# Patient Record
Sex: Male | Born: 1940
Health system: Southern US, Community
[De-identification: ages and names within clinical notes are randomized; demographics above are authoritative.]

## PROBLEM LIST (undated history)

## (undated) DIAGNOSIS — T4145XA Adverse effect of unspecified anesthetic, initial encounter: Secondary | ICD-10-CM

## (undated) DIAGNOSIS — R519 Headache, unspecified: Secondary | ICD-10-CM

## (undated) DIAGNOSIS — I10 Essential (primary) hypertension: Secondary | ICD-10-CM

## (undated) DIAGNOSIS — E785 Hyperlipidemia, unspecified: Secondary | ICD-10-CM

## (undated) DIAGNOSIS — H9190 Unspecified hearing loss, unspecified ear: Secondary | ICD-10-CM

## (undated) DIAGNOSIS — E109 Type 1 diabetes mellitus without complications: Secondary | ICD-10-CM

## (undated) DIAGNOSIS — T8859XA Other complications of anesthesia, initial encounter: Secondary | ICD-10-CM

## (undated) DIAGNOSIS — R51 Headache: Secondary | ICD-10-CM

## (undated) DIAGNOSIS — G8929 Other chronic pain: Secondary | ICD-10-CM

## (undated) DIAGNOSIS — G4733 Obstructive sleep apnea (adult) (pediatric): Secondary | ICD-10-CM

## (undated) HISTORY — DX: Headache, unspecified: R51.9

## (undated) HISTORY — DX: Type 1 diabetes mellitus without complications: E10.9

## (undated) HISTORY — DX: Hyperlipidemia, unspecified: E78.5

## (undated) HISTORY — DX: Other chronic pain: G89.29

## (undated) HISTORY — DX: Essential (primary) hypertension: I10

## (undated) HISTORY — PX: EYE SURGERY: SHX253

## (undated) HISTORY — DX: Obstructive sleep apnea (adult) (pediatric): G47.33

## (undated) HISTORY — PX: BACK SURGERY: SHX140

## (undated) HISTORY — PX: OTHER SURGICAL HISTORY: SHX169

## (undated) HISTORY — DX: Headache: R51

## (undated) HISTORY — PX: COLONOSCOPY: SHX174

---
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
## 1998-03-12 ENCOUNTER — Ambulatory Visit (HOSPITAL_COMMUNITY): Admission: RE | Admit: 1998-03-12 | Discharge: 1998-03-12 | Payer: Self-pay | Admitting: Psychiatry

## 1999-03-14 ENCOUNTER — Encounter: Admission: RE | Admit: 1999-03-14 | Discharge: 1999-06-12 | Payer: Self-pay | Admitting: Psychiatry

## 1999-12-30 ENCOUNTER — Other Ambulatory Visit: Admission: RE | Admit: 1999-12-30 | Discharge: 2000-01-05 | Payer: Self-pay | Admitting: Urology

## 2003-05-09 ENCOUNTER — Ambulatory Visit (HOSPITAL_COMMUNITY): Admission: RE | Admit: 2003-05-09 | Discharge: 2003-05-09 | Payer: Self-pay | Admitting: Gastroenterology

## 2003-11-19 ENCOUNTER — Ambulatory Visit: Payer: Self-pay | Admitting: Psychology

## 2004-05-14 ENCOUNTER — Ambulatory Visit: Payer: Self-pay | Admitting: Psychology

## 2005-04-09 ENCOUNTER — Encounter: Admission: RE | Admit: 2005-04-09 | Discharge: 2005-04-09 | Payer: Self-pay | Admitting: Internal Medicine

## 2006-10-21 ENCOUNTER — Ambulatory Visit (HOSPITAL_COMMUNITY): Payer: Self-pay | Admitting: Psychology

## 2006-10-26 ENCOUNTER — Ambulatory Visit (HOSPITAL_COMMUNITY): Payer: Self-pay | Admitting: Psychology

## 2006-11-10 ENCOUNTER — Ambulatory Visit (HOSPITAL_COMMUNITY): Payer: Self-pay | Admitting: Psychology

## 2007-03-03 HISTORY — PX: SHOULDER SURGERY: SHX246

## 2007-05-20 ENCOUNTER — Ambulatory Visit (HOSPITAL_COMMUNITY): Payer: Self-pay | Admitting: Psychology

## 2007-11-28 ENCOUNTER — Encounter: Admission: RE | Admit: 2007-11-28 | Discharge: 2007-11-28 | Payer: Self-pay | Admitting: Orthopedic Surgery

## 2009-03-02 DIAGNOSIS — G4733 Obstructive sleep apnea (adult) (pediatric): Secondary | ICD-10-CM

## 2009-03-02 HISTORY — DX: Obstructive sleep apnea (adult) (pediatric): G47.33

## 2009-10-31 ENCOUNTER — Ambulatory Visit: Payer: Self-pay | Admitting: Pulmonary Disease

## 2009-10-31 DIAGNOSIS — J310 Chronic rhinitis: Secondary | ICD-10-CM

## 2009-10-31 DIAGNOSIS — E109 Type 1 diabetes mellitus without complications: Secondary | ICD-10-CM

## 2009-10-31 DIAGNOSIS — G4700 Insomnia, unspecified: Secondary | ICD-10-CM

## 2009-10-31 DIAGNOSIS — R519 Headache, unspecified: Secondary | ICD-10-CM | POA: Insufficient documentation

## 2009-10-31 DIAGNOSIS — I10 Essential (primary) hypertension: Secondary | ICD-10-CM

## 2009-10-31 DIAGNOSIS — E785 Hyperlipidemia, unspecified: Secondary | ICD-10-CM | POA: Insufficient documentation

## 2009-10-31 DIAGNOSIS — G4733 Obstructive sleep apnea (adult) (pediatric): Secondary | ICD-10-CM

## 2009-10-31 DIAGNOSIS — R51 Headache: Secondary | ICD-10-CM

## 2009-10-31 HISTORY — DX: Essential (primary) hypertension: I10

## 2009-10-31 HISTORY — DX: Type 1 diabetes mellitus without complications: E10.9

## 2009-11-05 ENCOUNTER — Telehealth (INDEPENDENT_AMBULATORY_CARE_PROVIDER_SITE_OTHER): Payer: Self-pay | Admitting: *Deleted

## 2009-12-03 ENCOUNTER — Ambulatory Visit: Payer: Self-pay | Admitting: Pulmonary Disease

## 2009-12-11 ENCOUNTER — Encounter: Payer: Self-pay | Admitting: Pulmonary Disease

## 2010-01-10 ENCOUNTER — Ambulatory Visit (HOSPITAL_COMMUNITY): Admission: RE | Admit: 2010-01-10 | Discharge: 2010-01-11 | Payer: Self-pay | Admitting: Otolaryngology

## 2010-02-27 ENCOUNTER — Telehealth (INDEPENDENT_AMBULATORY_CARE_PROVIDER_SITE_OTHER): Payer: Self-pay | Admitting: *Deleted

## 2010-03-13 ENCOUNTER — Ambulatory Visit
Admission: RE | Admit: 2010-03-13 | Discharge: 2010-03-13 | Payer: Self-pay | Source: Home / Self Care | Attending: Pulmonary Disease | Admitting: Pulmonary Disease

## 2010-04-01 ENCOUNTER — Telehealth: Payer: Self-pay | Admitting: Pulmonary Disease

## 2010-04-01 NOTE — Progress Notes (Signed)
Summary: order of auto mode  done order sent to [facility].  Aundra Millet Farhaan Mabee LPN  November 05, 2009 3:19 PM   ---- Converted from flag ---- ---- 11/05/2009 11:16 AM, Oneita Jolly wrote: Aundra Millet will you put an order in for auto cpap x2wks with download for me thanks  ---- 10/31/2009 2:11 PM, Barbaraann Share MD wrote: they need to set on auto mode for 2 weeks with download to me. thanks  ---- 10/31/2009 12:01 PM, Oneita Jolly wrote: pt has respironics remstar auto aflex set@11cm  ------------------------------

## 2010-04-01 NOTE — Assessment & Plan Note (Signed)
Summary: self referral for management of osa   Copy to:  Self Referral Primary Provider/Referring Provider:  [name]  CC:  Sleep Consult.  History of Present Illness: the pt is a 70y/o male who comes in today as a self referral for management of osa.  He was diagnosed at the [facility] in [place] 10 yrs ago, and that study is not available.  He was started on cpap, and uses nightly, but does not know his pressure settings.  He does use a nasal mask, but wants to try a full face mask due to mouth opening.  He does not know if he is on auto mode, but states that he did have a pressure adjustment at the [place] 7mos ago.  He typically will pull his cpap off after wearing 3-4 hours.  He is unsure why he does this.  He typically goes to bed at 11pm, and arises at 6am to start his day.  He notes that if he awakens during the night, it may take him hours to get back to sleep due to mind racing.  He currently does not feel rested upon arising in the am's.  He notes some sleepiness during the day, but his epworth score is only 6.  He does take naps during the day, and likes to drink expresso during the afternoons.  Of note, his weight is up about 15 pounds since his sleep study 10 yrs ago.  Current Medications (verified): 1)  Ambien 5 Mg Tabs (Zolpidem Tartrate) .... Take 1 Tab By Mouth At Bedtime 2)  Avodart 0.5 Mg Caps (Dutasteride) .... Take 1 Tab By Mouth Every Other Day 3)  Uroxatral 10 Mg Xr24h-Tab (Alfuzosin Hcl) .... Take 1 Tab By Mouth Every Other Day 4)  Lisinopril 20 Mg Tabs (Lisinopril) .... Take 1 Tablet By Mouth Once A Day 5)  Zetia 10 Mg Tabs (Ezetimibe) .... Take 1 Tablet By Mouth Once A Day 6)  Allegra 60 Mg Tabs (Fexofenadine Hcl) .... As Needed 7)  Astepro 0.15 % Soln (Azelastine Hcl) .... Use At Bedtime 8)  Huperzine A (Herb) .... Take 1 Tablet By Mouth Once A Day  Allergies (verified): 1)  ! Aspirin 2)  ! Penicillin  Past History:  Past Medical History:  HEADACHE, CHRONIC  (ICD-784.0) OBSTRUCTIVE SLEEP APNEA (ICD-327.23) HYPERTENSION (ICD-401.9) HYPERLIPIDEMIA (ICD-272.4) DIABETES, TYPE 1 (ICD-250.01) ALLERGIC RHINITIS (ICD-477.9) s/p CHI 1975 with ICH, SDH..s/p crani    Past Surgical History: R shoulder surgery 2009 s/p crani for CHI  Family History: Reviewed history and no changes required. sister with multiple strokes brother with lung cancer. was a smoker. mother with dm and depression father was an alcoholic.  Social History: Reviewed history and no changes required. pt is married and lives with his wife. 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  pt smoked cigarettes for a "very short time as a teenager."  Pt then started smoking cigars-only occasionally- starting at age 21s.  Pt quit smoking cigars approx 2001.   Review of Systems       The patient complains of indigestion, headaches, and nasal congestion/difficulty breathing through nose.  The patient denies shortness of breath with activity, shortness of breath at rest, productive cough, non-productive cough, coughing up blood, chest pain, irregular heartbeats, acid heartburn, loss of appetite, weight change, abdominal pain, difficulty swallowing, sore throat, tooth/dental problems, sneezing, itching, ear ache, anxiety, depression, hand/feet swelling, joint stiffness or pain, rash, change in color of mucus,  and fever.    Vital Signs:  Patient profile:   70 year old male Height:      66 inches Weight:      254.13 pounds BMI:     41.17 O2 Sat:      99 % on Room air Temp:     98.5 degrees F oral Pulse rate:   78 / minute BP sitting:   142 / 80  (right arm) Cuff size:   large  Vitals Entered By: Arman Filter LPN (October 31, 2009 9:15 AM)  O2 Flow:  Room air CC: Sleep Consult Comments Medications reviewed with patient Arman Filter LPN  October 31, 2009 9:32 AM    Physical Exam  General:  obese male in nad Eyes:  PERRLA and  EOMI.   Nose:  mild deviation to left with narrowing no purulence Mouth:  moderate elongation of soft palate and uvula Neck:  no jvd, tmg, LN Lungs:  minimal basilar crackles, no wheezing or rhonchi Heart:  rrr, no mrg Abdomen:  soft and nontender, bs+ Extremities:  no edema noted, pulses intact distally no cyanosis  Neurologic:  alert and oriented, moves all 4.   Impression & Recommendations:  Problem # 1:  OBSTRUCTIVE SLEEP APNEA (ICD-327.23) the pt has osa of unknown severity, and is having issues with cpap tolerance and nonrestorative sleep.  He has gotten all of his care thru the [place], and we will try to get his records for review.  In the meantime, he would like to change his cpap supplier to a local dme.  He would like to try a full face mask, and we will also try to re-optimize his pressure for him.  I have also encouraged him to work aggressively on weight loss.  Problem # 2:  PERSISTENT DISORDER INITIATING/MAINTAINING SLEEP (ICD-307.42) the pt has sleep hygiene issues that he needs to work thru.  I have asked him to avoid taking naps, and not to use caffeine after 10am until his sleep improves.  I have also reveiwed stimulus control therapy with him to help with prolonged awakenings during the night.  Medications Added to Medication List This Visit: 1)  Ambien 5 Mg Tabs (Zolpidem tartrate) .... Take 1 tab by mouth at bedtime 2)  Avodart 0.5 Mg Caps (Dutasteride) .... Take 1 tab by mouth every other day 3)  Uroxatral 10 Mg Xr24h-tab (Alfuzosin hcl) .... Take 1 tab by mouth every other day 4)  Lisinopril 20 Mg Tabs (Lisinopril) .... Take 1 tablet by mouth once a day 5)  Zetia 10 Mg Tabs (Ezetimibe) .... Take 1 tablet by mouth once a day 6)  Allegra 60 Mg Tabs (Fexofenadine hcl) .... As needed 7)  Astepro 0.15 % Soln (Azelastine hcl) .... Use at bedtime 8)  Huperzine A (herb)  .... Take 1 tablet by mouth once a day  Other Orders: New Patient Level V (62831) DME Referral  (DME)  Patient Instructions: 1)  will get you set up with equipment company here in town..will try you on full face mask, and get pressure re-optimized. 2)  work on weight loss 3)  try the various behavioral therapies that we discussed...no reading in bed, no caffeine after 10am, no napping during day, spend no more than in bed if you cannot re-establish sleep. 4)  Once we get your cpap set, will give you 2-3 weeks with behavioral therapy to see if things improve.  Please give me feedback.

## 2010-04-01 NOTE — Assessment & Plan Note (Signed)
Summary: rov for osa   Copy to:  Self Referral Primary Janiyha Montufar/Referring Sahej Hauswirth:  [name]  CC:  follow up. pt states uses cpap 7/7 nights x 4-5 hrs a night. Pt states he is not having any issues with his machine/mask/pressure. Pt states he wants to know if he can go ahead and get his nose deviated.  Pt states he would like to get flu shot today. Christian Morgan  History of Present Illness: the pt comes in today for f/u of his osa.  He has been doing much better with new mask and his machine on auto mode.  We have yet to see a download from his dme.  The pt actually would like to stay on auto mode since he feels it is more comfortable.  He is wearing cpap compliantly, and feels that he is sleeping much better.  He continues to c/o of nasal obstruction, and would like to see ENT by having this corrected.  Current Medications (verified): 1)  Ambien 5 Mg Tabs (Zolpidem Tartrate) .... Take 1 Tab By Mouth At Bedtime 2)  Avodart 0.5 Mg Caps (Dutasteride) .... Take 1 Tab By Mouth Every Other Day 3)  Uroxatral 10 Mg Xr24h-Tab (Alfuzosin Hcl) .... Take 1 Tab By Mouth Every Other Day 4)  Lisinopril 20 Mg Tabs (Lisinopril) .... Take 1 Tablet By Mouth Once A Day 5)  Zetia 10 Mg Tabs (Ezetimibe) .... Take 1 Tablet By Mouth Once A Day 6)  Allegra 60 Mg Tabs (Fexofenadine Hcl) .... As Needed 7)  Astepro 0.15 % Soln (Azelastine Hcl) .... Use At Bedtime 8)  Huperzine A (Herb) .... Take 1 Tablet By Mouth Once A Day 9)  Metformin Hcl 500 Mg Tabs (Metformin Hcl) .... One Tablet Per Day  Allergies (verified): 1)  ! Aspirin 2)  ! Penicillin  Review of Systems       The patient complains of shortness of breath with activity, irregular heartbeats, and nasal congestion/difficulty breathing through nose.  The patient denies shortness of breath at rest, productive cough, non-productive cough, coughing up blood, chest pain, acid heartburn, indigestion, loss of appetite, weight change, abdominal pain, difficulty  swallowing, sore throat, tooth/dental problems, headaches, sneezing, itching, ear ache, anxiety, depression, hand/feet swelling, joint stiffness or pain, rash, change in color of mucus, and fever.    Vital Signs:  Patient profile:   70 year old male Height:      66 inches Weight:      256.38 pounds BMI:     41.53 O2 Sat:      96 % on Room air Temp:     97.9 degrees F oral Pulse rate:   81 / minute BP sitting:   142 / 82  (right arm) Cuff size:   large  Vitals Entered By: Carver Fila (December 03, 2009 11:10 AM)  O2 Flow:  Room air CC: follow up. pt states uses cpap 7/7 nights x 4-5 hrs a night. Pt states he is not having any issues with his machine/mask/pressure. Pt states he wants to know if he can go ahead and get his nose deviated.  Pt states he would like to get flu shot today.  Comments meds and allergies updated Phone number updated Carver Fila  December 03, 2009 11:11 AM    Physical Exam  General:  obese male in nad Nose:  no skin breakdown or pressure necrosis from cpap mask Extremities:  no significant edema or cyanosis  Neurologic:  alert, not sleepy, moves all 4.  Impression & Recommendations:  Problem # 1:  OBSTRUCTIVE SLEEP APNEA (ICD-327.23) the pt is doing well on his new machine and mask, and would like to stay on auto mode for treatment.  I am ok with  whatever will help him be more compliant.  I have also encouraged him to work aggressively on weight loss.  He is having ongoing issues with nasal obstruction, and would like a surgical opinion.  Wil. refer to ENT.  Medications Added to Medication List This Visit: 1)  Metformin Hcl 500 Mg Tabs (Metformin hcl) .... One tablet per day  Other Orders: Est. Patient Level III (62130) ENT Referral (ENT) Flu Vaccine 32yrs + MEDICARE PATIENTS (Q6578) Administration Flu vaccine - MCR (I6962)  Patient Instructions: 1)  will leave you on auto mode since it appears you are more comfortable on this. 2)  continue to work  on weight loss 3)  will give you the flu shot today 4)  will refer to ENT for nasal evaluation 5)  try turning heat up on humidifier to help with nasal congestion/moisture. 6)  followup with me in 6mos.   Immunization History:  Influenza Immunization History:    Influenza:  historical (11/30/2008)  Pneumovax Immunization History:    Pneumovax:  historical (11/30/2008)           Flu Vaccine Consent Questions     Do you have a history of severe allergic reactions to this vaccine? no    Any prior history of allergic reactions to egg and/or gelatin? no    Do you have a sensitivity to the preservative Thimersol? no    Do you have a past history of Guillan-Barre Syndrome? no    Do you currently have an acute febrile illness? no    Have you ever had a severe reaction to latex? no    Vaccine information given and explained to patient? yes    Are you currently pregnant? no    Lot Number:[id number]   Exp Date:08/30/2010   Site Given  Left Deltoid IMflu  Mindy Silva  December 03, 2009 12:13 PM

## 2010-04-01 NOTE — Consult Note (Signed)
Summary: [facility]  [facility]   Imported By: Sherian Rein 01/06/2010 09:51:58  _____________________________________________________________________  External Attachment:    Type:   Image     Comment:   External Document

## 2010-04-03 NOTE — Progress Notes (Signed)
Summary: issues sleeping - OV scheduled  Phone Note Call from Patient Call back at Home Phone [phone] Call back at [phone]   Caller: Patient Call For: Clance Reason for Call: Talk to Doctor Summary of Call: Pt c/o not being able to stay sleep after taking Ambien 5mg . Wants to know if [facility] feels he will benefit from Ambien CR. He goes to sleep right away but wakes up about 4 hours later groggy. Has researched this online and feels this will help him to sleep all night. Please advise. Thanks! [facility] [address] Initial call taken by: Zackery Barefoot CMA,  February 27, 2010 9:34 AM  Follow-up for Phone Call        I rarely prescribe sleeping pills because they do not help the situation,and often make things worse.  They are addicting and build tolerance.   If he is having insomnia issues, he needs ov if he would like to discuss Follow-up by: Barbaraann Share MD,  February 27, 2010 1:46 PM  Additional Follow-up for Phone Call Additional follow up Details #1::        Called, spoke with pt.  He was informed generally KC does not like to precribe sleeping pills bc they do not help the situation, are addicting, build tolerance, and can make things worse.  He verbalized understanding of this and does believe he needs to come in to see [facility] to discuss sleeping issues.  OV scheduled with KC for 03/13/10 at 3:45pm -- pt aware. Additional Follow-up by: Gweneth Dimitri RN,  February 27, 2010 1:57 PM

## 2010-04-03 NOTE — Assessment & Plan Note (Signed)
Summary: acute sick visit for insomnia   Copy to:  Self Referral Primary Provider/Referring Provider:  Renford Morgan  CC:  sick visit.  pt states he has no trouble falling asleep but wakes up during the night.  Pt states he is using his cpap machine every night.  Approx 4 hours per night.  Denies any complaints with mask or pressure. Christian Morgan  History of Present Illness: the pt comes in today for an acute sick visit related to sleeping issues.  He has known osa, and tells me that he is wearing cpap compliantly.  His main complaint today is that of poor sleep maintenance.  He has no issue with falling asleep, but will awaken almost every night around 3-4 am and cannot return to sleep.  He has tried taking [place] cr, but makes him feel groggy the next day.  He has worked on sleep hygiene and tried stimulus control therapy short term, but with no improvement.  He denies any issues with his mask fit or cpap pressure.     Current Medications (verified): 1)  Ambien 5 Mg Tabs (Zolpidem Tartrate) .... Take 1 Tab By Mouth At Bedtime 2)  Avodart 0.5 Mg Caps (Dutasteride) .... Take 1 Tablet By Mouth Once A Day 3)  Lisinopril 20 Mg Tabs (Lisinopril) .... Take 1 Tablet By Mouth Once A Day 4)  Zetia 10 Mg Tabs (Ezetimibe) .... Take 1 Tablet By Mouth Once A Day 5)  Allegra 60 Mg Tabs (Fexofenadine Hcl) .... As Needed 6)  Astepro 0.15 % Soln (Azelastine Hcl) .... Use At Bedtime As Needed 7)  Huperzine A (Herb) .... Take 1 Tablet By Mouth Once A Day 8)  Metformin Hcl 500 Mg Tabs (Metformin Hcl) .... 2 Tabs Daily 9)  Prilosec 10 Mg Cpdr (Omeprazole) .... As Needed  Allergies (verified): 1)  ! Aspirin 2)  ! Penicillin  Past History:  Past medical, surgical, family and social histories (including risk factors) reviewed, and no changes noted (except as noted below).  Past Medical History: Reviewed history from 10/31/2009 and no changes required.  HEADACHE, CHRONIC (ICD-784.0) OBSTRUCTIVE SLEEP APNEA  (ICD-327.23) HYPERTENSION (ICD-401.9) HYPERLIPIDEMIA (ICD-272.4) DIABETES, TYPE 1 (ICD-250.01) ALLERGIC RHINITIS (ICD-477.9) s/p CHI 1975 with ICH, SDH..s/p crani    Past Surgical History: Reviewed history from 10/31/2009 and no changes required. R shoulder surgery 2009 s/p crani for CHI  Family History: Reviewed history from 10/31/2009 and no changes required. sister with multiple strokes brother with lung cancer. was a smoker. mother with dm and depression father was an alcoholic.  Social History: Reviewed history from 10/31/2009 and no changes required. pt is married and lives with his wife. 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  pt smoked cigarettes for a "very short time as a teenager."  Pt then started smoking cigars-only occasionally- starting at age 78s.  Pt quit smoking cigars approx 2001.   Review of Systems       The patient complains of acid heartburn and indigestion.  The patient denies shortness of breath with activity, shortness of breath at rest, productive cough, non-productive cough, coughing up blood, chest pain, irregular heartbeats, loss of appetite, weight change, abdominal pain, difficulty swallowing, sore throat, tooth/dental problems, headaches, nasal congestion/difficulty breathing through nose, sneezing, itching, ear ache, anxiety, depression, hand/feet swelling, joint stiffness or pain, rash, change in color of mucus, and fever.    Vital Signs:  Patient profile:   70 year old male Height:  66 inches Weight:      252.25 pounds BMI:     40.86 O2 Sat:      96 % on Room air Temp:     98.5 degrees F oral Pulse rate:   98 / minute BP sitting:   130 / 74  (left arm) Cuff size:   large  Vitals Entered By: Arman Filter LPN (March 13, 2010 3:48 PM)  O2 Flow:  Room air CC: sick visit.  pt states he has no trouble falling asleep but wakes up during the night.  Pt states he is using his cpap machine  every night.  Approx 4 hours per night.  Denies any complaints with mask or pressure.  Comments Medications reviewed with patient Arman Filter LPN  March 13, 2010 3:49 PM    Physical Exam  General:  obese male in nad Nose:  no skin breakdown or pressure necrosis from cpap mask Heart:  rrr Extremities:  mild edema, no cyanosis Neurologic:  alert, does not appear sleepy, moves all 4.   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  I have told him that behavioral therapy is the treatment of choice, rather than sleeping pills.  He has tried some of the various behavioral techniques to see if it would help, but has not done so on a consistent basis.  I think he needs to work on stimulus control on a more consistent basis, and if not successful, may need cognitive behavioral therapy with a clinical psychologist or psychiatrist.  Will try trazodone short term for a few weeks to see if it helps "break the cycle", but would not treat him with this long term.    Problem # 2:  OBSTRUCTIVE SLEEP APNEA (ICD-327.23)  the pt states that he has no issues with his cpap mask or pressure, and is wearing the device compliantly.  I have also encouraged him to work on weight loss.  Medications Added to Medication List This Visit: 1)  Avodart 0.5 Mg Caps (Dutasteride) .... Take 1 tablet by mouth once a day 2)  Astepro 0.15 % Soln (Azelastine hcl) .... Use at bedtime as needed 3)  Metformin Hcl 500 Mg Tabs (Metformin hcl) .... 2 tabs daily 4)  Prilosec 10 Mg Cpdr (Omeprazole) .... As needed 5)  Trazodone Hcl 50 Mg Tabs (Trazodone hcl) .... At bedtime  Other Orders: Est. Patient Level IV (78295)  Patient Instructions: 1)  trial of trazodone 50mg  one at bedtime for next 2-3 weeks. 2)  continue with behavioral therapies as we have discussed  religiously. 3)  give me some feedback in 2 weeks with how things are going. 4)  if continuing to have issues, would recommend input from clinical psychologist.  Prescriptions: TRAZODONE HCL 50 MG  TABS (TRAZODONE HCL) At bedtime  #20 x 0   Entered and Authorized by:   Barbaraann Share MD   Signed by:   Barbaraann Share MD on 03/13/2010   Method used:   Print then Give to Patient   RxID:   [id number]

## 2010-04-09 NOTE — Progress Notes (Signed)
Summary: FYI  Phone Note Call from Patient Call back at [facility] Phone [phone]   Caller: Patient Call For: Madeleine Fenn Summary of Call: FYI for dr Kenneisha Cochrane. pt sent a fax that i have converted into a phone note per megan's request. the trazadone made pt dizzy so he stopped taking this. he says this did nothing for his sleep problem. last wk pt started taking melatonin and it has extended his sleep time by 1 hour most days. he is taking 2.5 mg of melatonin and will try 5 mg to see if that helps. he will make an appt with a "neurophysiologist and see if he can help. dr Kieth Brightly (or a referrel from dr R).  Initial call taken by: Tivis Ringer, CNA,  April 01, 2010 5:32 PM  Follow-up for Phone Call        noted.  I agree with this plan. Follow-up by: Barbaraann Share MD,  April 02, 2010 3:35 PM

## 2010-05-13 LAB — BASIC METABOLIC PANEL
BUN: 15 mg/dL (ref 6–23)
CO2: 29 mEq/L (ref 19–32)
Calcium: 10.8 mg/dL — ABNORMAL HIGH (ref 8.4–10.5)
Chloride: 102 mEq/L (ref 96–112)
Creatinine, Ser: 1.11 mg/dL (ref 0.4–1.5)
GFR calc Af Amer: >60 mL/min (ref >60)
GFR calc non Af Amer: >60 mL/min (ref >60)
Glucose, Bld: 132 mg/dL — ABNORMAL HIGH (ref 70–99)
Potassium: 4.4 mEq/L (ref 3.5–5.1)
Sodium: 137 mEq/L (ref 135–145)

## 2010-05-13 LAB — CBC
HCT: 42.1 % (ref 39.0–52.0)
Hemoglobin: 14.6 g/dL (ref 13.0–17.0)
MCH: 30.5 pg (ref 26.0–34.0)
MCHC: 34.7 g/dL (ref 30.0–36.0)
MCV: 87.9 fL (ref 78.0–100.0)
Platelets: 257 10*3/uL (ref 150–400)
RBC: 4.79 MIL/uL (ref 4.22–5.81)
RDW: 13.3 % (ref 11.5–15.5)
WBC: 7.6 10*3/uL (ref 4.0–10.5)

## 2010-05-13 LAB — GLUCOSE, CAPILLARY
Glucose-Capillary: 206 mg/dL — ABNORMAL HIGH (ref 70–99)
Glucose-Capillary: 212 mg/dL — ABNORMAL HIGH (ref 70–99)
Glucose-Capillary: 222 mg/dL — ABNORMAL HIGH (ref 70–99)
Glucose-Capillary: 337 mg/dL — ABNORMAL HIGH (ref 70–99)

## 2010-05-13 LAB — SURGICAL PCR SCREEN: MRSA, PCR: NEGATIVE

## 2010-06-02 ENCOUNTER — Ambulatory Visit: Payer: Self-pay | Admitting: Pulmonary Disease

## 2010-07-18 NOTE — Op Note (Signed)
NAME:  Christian Morgan, DUCHESNE SR                        ACCOUNT NO.:  [id number]   MEDICAL RECORD NO.:  [id number]                   PATIENT TYPE:  AMB   LOCATION:  ENDO                                 FACILITY:  [facility]   PHYSICIAN:  Graylin Shiver, M.D.                DATE OF BIRTH:  09/14/1940   DATE OF PROCEDURE:  05/09/2003  DATE OF DISCHARGE:                                 OPERATIVE REPORT   PROCEDURE:  Colonoscopy.   INDICATIONS FOR PROCEDURE:  Screening.   CONSENT:  Informed consent was obtained after explanation of the risks of  bleeding, infection, and perforation.   PREMEDICATION:  Fentanyl 80 mcg IV, Versed 8 mg IV.   PROCEDURE IN DETAIL:  With the patient in the left lateral decubitus  position, a rectal exam was performed and no masses were felt.  The Olympus  colonoscope was inserted into the rectum and advanced around the colon to  the cecum.  Cecal landmarks were identified.  The cecum and ascending colon  were normal.  The transverse colon was normal.  The descending colon,  sigmoid, and rectum were normal.  He tolerated the procedure well without  complications.   IMPRESSION:  Normal colonoscopy to the cecum.   PLAN:  I would recommend a screening colonoscopy again in ten years.                                               Graylin Shiver, M.D.    Germain Osgood  D:  05/09/2003  T:  05/09/2003  Job:  [id number]

## 2010-09-22 ENCOUNTER — Telehealth: Payer: Self-pay | Admitting: Pulmonary Disease

## 2010-09-22 NOTE — Telephone Encounter (Signed)
Spoke with pt and notified of recs per CDY. Pt verbalized understanding and denied any further questions.

## 2010-09-22 NOTE — Telephone Encounter (Signed)
Ask him first to contact his DME company and ask for help with the RAMP on his CPAP machine. Call us back if that doesn't fix the problem.

## 2010-09-22 NOTE — Telephone Encounter (Signed)
I spoke with the pt and he states when he first puts on his cpap he does not feel like he is getting enough air. He states he feels like he tries to take deep breaths in order to pull in more air. He is asking that his pressure be increased. Pt states after having mask on a while he feels ok. KC out of the office and pt request this be done as soon as possible so I will forward to Dr. Maple Hudson.  Please advise.Carron Curie, CMA

## 2010-09-23 ENCOUNTER — Telehealth: Payer: Self-pay | Admitting: Pulmonary Disease

## 2010-09-23 ENCOUNTER — Ambulatory Visit (INDEPENDENT_AMBULATORY_CARE_PROVIDER_SITE_OTHER): Payer: Medicare Other | Admitting: Psychology

## 2010-09-23 DIAGNOSIS — F068 Other specified mental disorders due to known physiological condition: Secondary | ICD-10-CM

## 2010-09-23 DIAGNOSIS — G4700 Insomnia, unspecified: Secondary | ICD-10-CM

## 2010-09-23 NOTE — Telephone Encounter (Signed)
lmtcb

## 2010-09-24 NOTE — Telephone Encounter (Signed)
Spoke with pt. He states that his CPAP was "reset" and he states that it is working better for him now, but he does want new machine since the one he has is 70 yrs old. Please advise if okay to send order to [facility] for a new one, thanks!

## 2010-09-30 ENCOUNTER — Other Ambulatory Visit: Payer: Self-pay | Admitting: Pulmonary Disease

## 2010-09-30 DIAGNOSIS — G4733 Obstructive sleep apnea (adult) (pediatric): Secondary | ICD-10-CM

## 2010-09-30 NOTE — Telephone Encounter (Signed)
Will send an order to dme to get new machine IF he qualifies.  Let pt know.

## 2010-10-01 NOTE — Telephone Encounter (Signed)
[email or web address]  

## 2010-10-01 NOTE — Telephone Encounter (Signed)
Spoke with pt and notified order was sent to [facility] for new CPAP if he qualifies. Pt verbalized understanding.

## 2011-02-02 ENCOUNTER — Ambulatory Visit: Payer: Medicare Other | Admitting: Pulmonary Disease

## 2011-02-03 ENCOUNTER — Telehealth: Payer: Self-pay | Admitting: Pulmonary Disease

## 2011-02-03 NOTE — Telephone Encounter (Signed)
Megan, please call pt to discuss cpap with him.  Let him know I am happy he is sleeping better. I have reviewed his download, and he is wearing compliantly (>70%), and is not having mask leaks of significance.  He is having some breakthru apneas but not a lot. Can leave on auto pressure setting or put on a set pressure at 15cm.  Let me know.

## 2011-02-04 NOTE — Telephone Encounter (Signed)
Called and spoke with pt.  Informed him of KC's response and recs.  Pt states he is doing great on his cpap currently and would like to leave pressure where it is at, which is on the auto setting.  Pt will also keep pending appt with [facility] for 02/18/11 for routine f/u appt.  Will forward message back to [facility] as an [place]

## 2011-02-17 ENCOUNTER — Encounter: Payer: Self-pay | Admitting: Pulmonary Disease

## 2011-02-18 ENCOUNTER — Encounter: Payer: Self-pay | Admitting: Pulmonary Disease

## 2011-02-18 ENCOUNTER — Ambulatory Visit (INDEPENDENT_AMBULATORY_CARE_PROVIDER_SITE_OTHER): Payer: Medicare Other | Admitting: Pulmonary Disease

## 2011-02-18 DIAGNOSIS — G4700 Insomnia, unspecified: Secondary | ICD-10-CM

## 2011-02-18 DIAGNOSIS — G4733 Obstructive sleep apnea (adult) (pediatric): Secondary | ICD-10-CM

## 2011-02-18 NOTE — Progress Notes (Signed)
  Subjective:    Patient ID: Christian Morgan, male    DOB: 1940-10-13, 70 y.o.   MRN: [id number]  HPI The patient comes in today for followup of his known obstructive sleep apnea.  He has also had issues with insomnia, but is doing much better after seeing a neuropsychologist.  He is wearing CPAP compliantly, and denies any issues with mask fit or pressure.  He has occasional leaks, but does not feel this is an issue.  He feels that his sleep is much improved, and is satisfied with his daytime alertness.   Review of Systems  Constitutional: Negative for fever and unexpected weight change.  HENT: Negative for ear pain, nosebleeds, congestion, sore throat, rhinorrhea, sneezing, trouble swallowing, dental problem, postnasal drip and sinus pressure.   Eyes: Negative for redness and itching.  Respiratory: Positive for cough. Negative for chest tightness, shortness of breath and wheezing.   Cardiovascular: Negative for palpitations and leg swelling.  Gastrointestinal: Negative for nausea and vomiting.  Genitourinary: Negative for dysuria.  Musculoskeletal: Negative for joint swelling.  Skin: Negative for rash.  Neurological: Positive for headaches.  Hematological: Does not bruise/bleed easily.  Psychiatric/Behavioral: Negative for dysphoric mood. The patient is not nervous/anxious.        Objective:   Physical Exam Overweight male in no acute distress No skin breakdown or pressure necrosis from the CPAP mask Lower extremities with minimal edema, no cyanosis noted Alert and oriented, does not appear to be sleepy, moves all 4 extremities.       Assessment & Plan:

## 2011-02-18 NOTE — Assessment & Plan Note (Signed)
The pt has been seeing a neuropsychologist and is doing much better.

## 2011-02-18 NOTE — Assessment & Plan Note (Signed)
The patient is doing very well with CPAP currently.  He denies any issues with pressure, and his mask fit is adequate.  He does have a few leaks on occasion, but does not feel this is a big issue.  He is sleeping well, and has adequate daytime alertness.  Have encouraged patient to keep up with supplies, and also to work aggressively on weight loss.

## 2011-02-18 NOTE — Patient Instructions (Signed)
Continue with cpap, and work on weight loss Try chlorpheniramine 8mg  over the counter at bedtime for postnasal drip in the place of allegra If your cough persists, please talk to your primary md about coming off lisinopril for an 8 week trial followup with me in one year.

## 2011-03-10 NOTE — Telephone Encounter (Signed)
Called and spoke with pt. He states that ov last year he talked with [facility] about his cough and KC rec for him to take chlorpheniramine otc and he has been taking been since then and cough is no better. He states that the cough is mainly dry, but sometimes is able to produce large amounts of clear sputum. He states that [facility] mentioned taking him off of ACE to see if this helps. KC, looks like you have only seen him about his sleep issues. Please advise if you want to get him in to see you about his cough or defer this issue to his PCP, thanks!

## 2011-03-10 NOTE — Telephone Encounter (Signed)
Patient returning call.  Please call cell--[phone]

## 2011-03-10 NOTE — Telephone Encounter (Signed)
I would be happy to see him for his cough, but not until he has been off ace for 6-8 weeks.  He needs to talk with his primary about getting off the med.  His cough is due to the ACE until proven otherwise.  If his cough does not resolve off medication, would be happy to see him.

## 2011-03-10 NOTE — Telephone Encounter (Signed)
Pt stated that it doesn't matter if pt takes antihistamines or not, he is still coughing.  Please call pt back at  [phone].  Pt stated he will not be home between 10 am & 12 noon.  Antionette Fairy

## 2011-03-10 NOTE — Telephone Encounter (Signed)
Spoke with pt and notified of recs per [facility]. Pt verbalized understanding and states that there is nothing further needed at this time, will call back if the cough does not improve off ACE.

## 2011-03-10 NOTE — Telephone Encounter (Signed)
[id number] x1 

## 2011-03-26 ENCOUNTER — Ambulatory Visit (INDEPENDENT_AMBULATORY_CARE_PROVIDER_SITE_OTHER): Payer: Medicare Other | Admitting: Psychology

## 2011-03-26 ENCOUNTER — Encounter (HOSPITAL_COMMUNITY): Payer: Self-pay | Admitting: Psychology

## 2011-03-26 DIAGNOSIS — F068 Other specified mental disorders due to known physiological condition: Secondary | ICD-10-CM

## 2011-03-26 DIAGNOSIS — G4701 Insomnia due to medical condition: Secondary | ICD-10-CM

## 2011-03-26 NOTE — Progress Notes (Signed)
Patient:  Christian Morgan   DOB: 08-05-1940   MR Number: [id number]   Location: [facility] [address] [place] [address] Dept: [phone]  Start:  1 PM End:  2 PM  Provider/Observer:     Hershal Coria PSYD  Chief Complaint:      Chief Complaint  Patient presents with  . Memory Loss  . Depression  . Anxiety    Reason For Service:     The patient was initially referred to our office because of cognitive difficulties that were feared to be associated with a progressive dementia or a viral encephalitis. However, the results of repeated neuropsychological assessment do not suggest a progressive decline even though there were indications of significant memory difficulties. These difficulties have been stable in nature. The patient has had difficulty adjusting to these increased cognitive problems including frustration and irritability. More recently, the patient had had a worsening of his historically poor sleep patterns. The patient had always had difficulty getting a good night sleep and these have gotten progressively worsened. The treating physician felt that behavioral interventions may be helpful.  Interventions Strategy:  Cognitive/behavioral therapeutic interventions and sleep hygiene work.  Participation Level:   Active  Participation Quality:  Appropriate      Behavioral Observation:  Well Groomed, Alert, and Appropriate.   Current Psychosocial Factors:  The patient reports that he has been actively working on a therapeutic interventions particularly around sleep hygiene issues as well as looking at some dietary changes. He reports that he has been sleeping much better and in fact has been sleeping better than he has for many years if not decades. The patient reports that the results of this improve sleep has been improved cognitive functioning as well as an improved emotional  state. Was psychosocial factor that he is continuing to way on him a lot is the lack of improvement with his wife. She is now almost completely stop driving and his increasingly fearful of doing things and he essentially does all of the household responsibilities outside of the home and drives everywhere with the exception of a few situations when they drive long distances.  Content of Session:   Review current symptoms and continued work on therapeutic interventions particularly around issues of his sleep patterns.  Current Status:   The patient reports he is improved significantly with regard to his sleep and this has produced a subsequent improvement in both his cognitive functioning and mood.  Patient Progress:   Very good  Target Goals:   Target goals include improve sleep hygiene and sleep patterns, reduced anxiety and stress responses, and improved overall medical status.  Last Reviewed:   03/26/2011  Goals Addressed Today:    Today we continue to work on issues related to sleep hygiene and sleep patterns.  Impression/Diagnosis:   The patient has maintained stable status as far as his cognitive performance and while there are significant memory deficits and difficulties they are very stable over time and this clearly indicates it is not a degenerative dementia.  Diagnosis:    Axis I:  1. 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 Insomnia due to medical condition         Axis II: No diagnosis

## 2012-02-15 ENCOUNTER — Encounter: Payer: Self-pay | Admitting: Pulmonary Disease

## 2012-02-15 ENCOUNTER — Ambulatory Visit (INDEPENDENT_AMBULATORY_CARE_PROVIDER_SITE_OTHER): Payer: Medicare Other | Admitting: Pulmonary Disease

## 2012-02-15 VITALS — BP 142/80 | HR 85 | Temp 97.8°F | Ht 65.5 in | Wt 255.6 lb

## 2012-02-15 DIAGNOSIS — G4733 Obstructive sleep apnea (adult) (pediatric): Secondary | ICD-10-CM

## 2012-02-15 NOTE — Progress Notes (Signed)
  Subjective:    Patient ID: Christian Morgan, male    DOB: 05/12/1940, 71 y.o.   MRN: [id number]  HPI Patient comes in today for followup of his known obstructive sleep apnea.  He is wearing CPAP compliantly, is having no issues with his mask fit or pressure.  He is sleeping better with CPAP and under the guidance of a neuropsychologist.  He feels rested in the mornings upon arising, and denies any significant issues with daytime sleepiness.   Review of Systems  Constitutional: Negative for fever and unexpected weight change.  HENT: Positive for nosebleeds, congestion, sore throat, rhinorrhea, sneezing, postnasal drip and sinus pressure. Negative for ear pain, trouble swallowing and dental problem.        Patient was sick x last 10 days  Eyes: Negative for redness and itching.  Respiratory: Positive for cough ( with chest congestion x 10 days...little gray mucus is coughed up today). Negative for chest tightness, shortness of breath and wheezing.   Cardiovascular: Negative for palpitations and leg swelling.  Gastrointestinal: Negative for nausea and vomiting.  Genitourinary: Negative for dysuria.  Musculoskeletal: Negative for joint swelling.  Skin: Negative for rash.  Neurological: Positive for headaches.  Hematological: Does not bruise/bleed easily.  Psychiatric/Behavioral: Negative for dysphoric mood. The patient is not nervous/anxious.        Objective:   Physical Exam Obese male in no acute distress Nose without purulence or discharge noted Neck without thyromegaly or lymphadenopathy No skin breakdown or pressure necrosis from the CPAP mask Lower extremities mild edema, no cyanosis Alert, does not appear to be sleepy, moves all 4 extremities.       Assessment & Plan:

## 2012-02-15 NOTE — Patient Instructions (Addendum)
Stay on cpap, and keep up with mask changes and supplies. Work on weight loss followup with me in one year if doing well.  

## 2012-02-15 NOTE — Assessment & Plan Note (Signed)
The patient is wearing CPAP compliantly, and is having no issues with his device.  He is sleeping much better, and feels rested the following day.  He denies any significant daytime sleepiness.  I have asked him to keep up with his mask changes and supplies, and work aggressively on weight loss.

## 2012-12-16 ENCOUNTER — Encounter: Payer: Self-pay | Admitting: Pulmonary Disease

## 2012-12-16 ENCOUNTER — Ambulatory Visit (INDEPENDENT_AMBULATORY_CARE_PROVIDER_SITE_OTHER): Payer: Medicare Other | Admitting: Pulmonary Disease

## 2012-12-16 VITALS — BP 132/80 | HR 85 | Temp 97.5°F | Ht 65.0 in | Wt 256.6 lb

## 2012-12-16 DIAGNOSIS — G4733 Obstructive sleep apnea (adult) (pediatric): Secondary | ICD-10-CM

## 2012-12-16 DIAGNOSIS — J309 Allergic rhinitis, unspecified: Secondary | ICD-10-CM

## 2012-12-16 NOTE — Patient Instructions (Signed)
Stop astepro nasal spray Trial of dymista one spray each nostril twice a day everyday.  Let me know how things are going in next 2-3 weeks. Continue with cpap Work on weight loss followup with me in one year if doing well.

## 2012-12-16 NOTE — Assessment & Plan Note (Signed)
The patient continues to do well with CPAP with regards to his sleep and daytime alertness.  However, with his ongoing rhinitis issues, he is either not wearing at times or having to sleep in a chair.  We'll try and address the rhinitis issues with medication, but ultimately he may have to make adjustments to his humidification.  I have also encouraged him to work aggressively on weight loss.

## 2012-12-16 NOTE — Progress Notes (Signed)
  Subjective:    Patient ID: Christian Morgan, male    DOB: 13-Sep-1940, 72 y.o.   MRN: [id number]  HPI The patient comes in today for followup of his obstructive sleep apnea.  He is wearing CPAP compliantly for the most part, but sometimes cannot weigh her because of nasal congestion.  Other times, he has to sleep in a recliner in order to wear CPAP with his congestion.  It is occurring primarily on his right side, and he has been examined by otolaryngology who feels that his nasal airway is patent.  He feels that he sleeps well with the device, and is not having any daytime alertness issues.  His weight is stable from the last visit.   Review of Systems  Constitutional: Negative for fever and unexpected weight change.  HENT: Positive for congestion ( at night) and sinus pressure. Negative for dental problem, ear pain, nosebleeds, postnasal drip, rhinorrhea, sneezing, sore throat and trouble swallowing.   Eyes: Negative for redness and itching.  Respiratory: Negative for cough, chest tightness, shortness of breath and wheezing.   Cardiovascular: Negative for palpitations and leg swelling.  Gastrointestinal: Negative for nausea and vomiting.  Genitourinary: Negative for dysuria.  Musculoskeletal: Negative for joint swelling.  Skin: Negative for rash.  Neurological: Negative for headaches.  Hematological: Does not bruise/bleed easily.  Psychiatric/Behavioral: Negative for dysphoric mood. The patient is not nervous/anxious.        Objective:   Physical Exam Obese male in no acute distress Nose without purulence or discharge noted Mild septal deviation to the left, but patent bilaterally, and no turbinate hypertrophy. Oropharynx clear Neck without lymphadenopathy or thyromegaly Lower extremities with no edema, no cyanosis Alert and oriented, does not appear to be sleepy, moves all 4 extremities.       Assessment & Plan:

## 2012-12-16 NOTE — Assessment & Plan Note (Signed)
The patient has chronic rhinitis, and it is unclear if this is allergic versus vasomotor rhinitis.  It is now interfering with his CPAP use, and is not helped by Astrepro alone.  Will try him on dymista.

## 2012-12-28 ENCOUNTER — Telehealth: Payer: Self-pay | Admitting: Pulmonary Disease

## 2012-12-28 NOTE — Telephone Encounter (Signed)
KC pt, per last OV note: The patient has chronic rhinitis, and it is unclear if this is allergic versus vasomotor rhinitis. It is now interfering with his CPAP use, and is not helped by Astrepro alone. Will try him on dymista. Trial of dymista one spray each nostril twice a day everyday. Let me know how things are going in next 2-3 weeks.   Pt called today to states that dymista is not helping nasal congestion at all. He states congestion is worse when he lays down, and one side is worse then the other. He states Dr. Shelle Iron mentioned him trying something OTC on the one side that is worse but he cannot remember the name. Pt requested message be sent to doc-of-day. Please advise. Carron Curie, CMA Allergies  Allergen Reactions  . Amitriptyline Hcl   . Aspirin   . Penicillins

## 2012-12-28 NOTE — Telephone Encounter (Signed)
Afrin 2 puffs 2 min prior to dymista x 5 days only then off x 5 days and back on x 5 days if relapses

## 2012-12-28 NOTE — Telephone Encounter (Signed)
I spoke with pt and is aware of recs. Nothing further needed 

## 2013-01-13 ENCOUNTER — Telehealth: Payer: Self-pay | Admitting: Pulmonary Disease

## 2013-01-13 NOTE — Telephone Encounter (Signed)
I spoke with pt. He is aware of KC recs. He reports he saw Dr. Annalee Genta about 9 months and will call for an appt. Nothing further needed

## 2013-01-13 NOTE — Telephone Encounter (Signed)
I think he needs to see ENT to see if he has a structural problem that is causing this.  See if he has seen an ent in the past, and we can make the referral for "nasal obstruction".

## 2013-01-13 NOTE — Telephone Encounter (Signed)
I spoke with pt. He reports he tried the afrin and dymista as directed by [facility]. He noticed a little difference for a few days but not much. He is still having a lot of nasal congestion at night. He has tried saline rinses--works for a few hrs but that's it. He has tried astepro and flonase. He has also tried using the saline rinse and breathe right strips. Pt reports he had a horrible night last night and he is using his CPAP and makes it worse. At times he is having to sleep in the recliner to help with this. Pt is requesting further recs. Please advise KC thanks   Allergies  Allergen Reactions  . Amitriptyline Hcl   . Aspirin   . Penicillins

## 2013-02-14 ENCOUNTER — Ambulatory Visit: Payer: Medicare Other | Admitting: Pulmonary Disease

## 2013-09-15 ENCOUNTER — Emergency Department (HOSPITAL_COMMUNITY)
Admission: EM | Admit: 2013-09-15 | Discharge: 2013-09-15 | Disposition: A | Discharge status: Home / Self Care | Payer: Medicare Other | Attending: Emergency Medicine | Admitting: Emergency Medicine

## 2013-09-15 ENCOUNTER — Encounter (HOSPITAL_COMMUNITY): Payer: Self-pay | Admitting: Emergency Medicine

## 2013-09-15 DIAGNOSIS — M5442 Lumbago with sciatica, left side: Secondary | ICD-10-CM

## 2013-09-15 DIAGNOSIS — Z79899 Other long term (current) drug therapy: Secondary | ICD-10-CM | POA: Insufficient documentation

## 2013-09-15 DIAGNOSIS — G8929 Other chronic pain: Secondary | ICD-10-CM | POA: Insufficient documentation

## 2013-09-15 DIAGNOSIS — Z87891 Personal history of nicotine dependence: Secondary | ICD-10-CM | POA: Insufficient documentation

## 2013-09-15 DIAGNOSIS — E109 Type 1 diabetes mellitus without complications: Secondary | ICD-10-CM | POA: Insufficient documentation

## 2013-09-15 DIAGNOSIS — I10 Essential (primary) hypertension: Secondary | ICD-10-CM | POA: Insufficient documentation

## 2013-09-15 DIAGNOSIS — Z9889 Other specified postprocedural states: Secondary | ICD-10-CM | POA: Insufficient documentation

## 2013-09-15 DIAGNOSIS — M5430 Sciatica, unspecified side: Secondary | ICD-10-CM | POA: Insufficient documentation

## 2013-09-15 DIAGNOSIS — Z880 Allergy status to penicillin: Secondary | ICD-10-CM | POA: Insufficient documentation

## 2013-09-15 MED ORDER — IBUPROFEN 600 MG PO TABS: 600.0000 mg | ORAL_TABLET | Freq: Three times a day (TID) | ORAL | Status: DC

## 2013-09-15 MED ORDER — DIAZEPAM 5 MG PO TABS: 5.0000 mg | ORAL_TABLET | Freq: Four times a day (QID) | ORAL | Status: DC | PRN

## 2013-09-15 MED ORDER — IBUPROFEN 400 MG PO TABS
600.0000 mg | ORAL_TABLET | Freq: Once | ORAL | Status: AC
  Administered 2013-09-15: 600 mg via ORAL
  Filled 2013-09-15 (×2): qty 1

## 2013-09-15 MED ORDER — DIAZEPAM 5 MG PO TABS
5.0000 mg | ORAL_TABLET | Freq: Once | ORAL | Status: AC
  Administered 2013-09-15: 5 mg via ORAL
  Filled 2013-09-15: qty 1

## 2013-09-15 NOTE — Discharge Instructions (Signed)
Back Pain, Adult Back pain is very common. The pain often gets better over time. The cause of back pain is usually not dangerous. Most people can learn to manage their back pain on their own.  HOME CARE   Stay active. Start with short walks on flat ground if you can. Try to walk farther each day.  Do not sit, drive, or stand in one place for more than 30 minutes. Do not stay in bed.  Do not avoid exercise or work. Activity can help your back heal faster.  Be careful when you bend or lift an object. Bend at your knees, keep the object close to you, and do not twist.  Sleep on a firm mattress. Lie on your side, and bend your knees. If you lie on your back, put a pillow under your knees.  Only take medicines as told by your doctor.  Put ice on the injured area.  Put ice in a plastic bag.  Place a towel between your skin and the bag.  Leave the ice on for 15-20 minutes, 03-04 times a day for the first 2 to 3 days. After that, you can switch between ice and heat packs.  Ask your doctor about back exercises or massage.  Avoid feeling anxious or stressed. Find good ways to deal with stress, such as exercise. GET HELP RIGHT AWAY IF:   Your pain does not go away with rest or medicine.  Your pain does not go away in 1 week.  You have new problems.  You do not feel well.  The pain spreads into your legs.  You cannot control when you poop (bowel movement) or pee (urinate).  Your arms or legs feel weak or lose feeling (numbness).  You feel sick to your stomach (nauseous) or throw up (vomit).  You have belly (abdominal) pain.  You feel like you may pass out (faint). MAKE SURE YOU:   Understand these instructions.  Will watch your condition.  Will get help right away if you are not doing well or get worse. Document Released: 08/05/2007 Document Revised: 05/11/2011 Document Reviewed: 07/07/2010 ExitCare Patient Information 2015 [employer]. This information is not intended  to replace advice given to you by your health care provider. Make sure you discuss any questions you have with your health care provider.  

## 2013-09-15 NOTE — ED Notes (Signed)
Brought pt back to room via wheelchair with family in tow; pt getting undressed and into a gown at this time; family at bedside; Adria, NT assisting

## 2013-09-15 NOTE — ED Notes (Signed)
Pt c/o left lower back pain x quite a while and sts bending over today had sharp severe pain with some diaphoresis from severe pain; pt still c/o pain

## 2013-09-16 NOTE — ED Provider Notes (Signed)
CSN: [id number]     Arrival date & time 09/15/13  1256 History   First MD Initiated Contact with Patient 09/15/13 1500     Chief Complaint  Patient presents with  . Back Pain      HPI Patient reports low back pain over the past several days.  He has a long-standing history of back pain but reports this has been worse.  He bent over earlier today and developed a sharp pain in his back with some radiation down his left buttock.  He denies new weakness in his arms or legs.  No history of cancer.  Denies fever.  No IV drug abuse.  States he feels better at this time.  They'll reports that when his pain became severe he became somewhat diaphoretic and pale.  Patient denies chest pain shortness of breath.  He denies abdominal pain.  No radiation of his back pain around to his abdomen.  No history of kidney stones.   Past Medical History  Diagnosis Date  . Chronic headache   . OSA (obstructive sleep apnea)   . Hypertension   . Hyperlipidemia   . Diabetes mellitus, type 1   . Allergic rhinitis    Past Surgical History  Procedure Laterality Date  . Shoulder surgery  2009    Right  . S/p crani for chi     Family History  Problem Relation Age of Onset  . Stroke Sister   . Lung cancer Brother   . Diabetes Mother   . Depression Mother   . Alcohol abuse Father    History  Substance Use Topics  . Smoking status: Former Smoker -- 0.10 packs/day    Types: Cigarettes, Cigars    Quit date: 03/02/2009  . Smokeless tobacco: Not on file     Comment: smoked an occ cigarette "for a very short time" as a teenager.  Then started smoking cigars- only occassional starting at age 2520s.   . Alcohol Use: Yes     Comment: 1-2 drinks in evenings    Review of Systems  All other systems reviewed and are negative.     Allergies  Amitriptyline hcl; Aspirin; and Penicillins  Home Medications   Prior to Admission medications   Medication Sig Start Date End Date Taking? Authorizing Provider   citalopram (CELEXA) 10 MG tablet Take 10 mg by mouth daily.     Yes Historical Provider, MD  dutasteride (AVODART) 0.5 MG capsule Take 0.5 mg by mouth daily.     Yes Historical Provider, MD  fexofenadine (ALLEGRA) 60 MG tablet Take 60 mg by mouth daily as needed for allergies.    Yes Historical Provider, MD  fluticasone (FLONASE) 50 MCG/ACT nasal spray Place 1 spray into both nostrils at bedtime.   Yes Historical Provider, MD  hydrochlorothiazide (HYDRODIURIL) 25 MG tablet Take 12.5 mg by mouth daily. 09/13/13  Yes Historical Provider, MD  HYDROcodone-acetaminophen (NORCO) 10-325 MG per tablet Take 1 tablet by mouth every 4 (four) hours as needed (for pain).  08/25/13  Yes Historical Provider, MD  losartan (COZAAR) 100 MG tablet Take 100 mg by mouth daily.   Yes Historical Provider, MD  metFORMIN (GLUCOPHAGE) 500 MG tablet Take 1,000 mg by mouth 2 (two) times daily.    Yes Historical Provider, MD  omeprazole (PRILOSEC) 10 MG capsule Take 10 mg by mouth daily as needed (for acid reflux).    Yes Historical Provider, MD  diazepam (VALIUM) 5 MG tablet Take 1 tablet (5 mg total) by  mouth every 6 (six) hours as needed for muscle spasms. 09/15/13   Lyanne Co, MD  ibuprofen (ADVIL,MOTRIN) 600 MG tablet Take 1 tablet (600 mg total) by mouth 3 (three) times daily. 09/15/13   Lyanne Co, MD   BP 159/64  Pulse 73  Temp(Src) 98.2 F (36.8 C) (Oral)  Resp 15  Ht 5\' 5"  (1.651 m)  Wt 245 lb (111.131 kg)  BMI 40.77 kg/m2  SpO2 100% Physical Exam  Nursing note and vitals reviewed. Constitutional: He is oriented to person, place, and time. He appears well-developed and well-nourished.  HENT:  Head: Normocephalic and atraumatic.  Eyes: EOM are normal.  Neck: Normal range of motion.  Cardiovascular: Normal rate, regular rhythm, normal heart sounds and intact distal pulses.   Pulmonary/Chest: Effort normal and breath sounds normal. No respiratory distress.  Abdominal: Soft. He exhibits no distension.  There is no tenderness.  Musculoskeletal: Normal range of motion.  The thoracic or lumbar tenderness.  5 out of 5 strength in bilateral lower extremity major muscle groups.  Neurological: He is alert and oriented to person, place, and time.  Skin: Skin is warm and dry.  Psychiatric: He has a normal mood and affect. Judgment normal.    ED Course  Procedures (including critical care time) Labs Review Labs Reviewed - No data to display  Imaging Review No results found.   EKG Interpretation None      MDM   Final diagnoses:  Left-sided low back pain with left-sided sciatica    Vision feels much better at this time.  Doubt kidney stone.  I suspect he had some lumbar spasm.  He feels better.  The majority of his symptoms sound like left-sided sciatica.  No indication for imaging.  Home with PCP followup.  Patient understands to return to the ER for new or worsening symptoms    Lyanne Co, MD 09/16/13 [phone]

## 2013-12-10 ENCOUNTER — Emergency Department (HOSPITAL_COMMUNITY)
Admission: EM | Admit: 2013-12-10 | Discharge: 2013-12-10 | Disposition: A | Discharge status: Home / Self Care | Payer: Medicare Other | Attending: Emergency Medicine | Admitting: Emergency Medicine

## 2013-12-10 ENCOUNTER — Encounter (HOSPITAL_COMMUNITY): Payer: Self-pay | Admitting: Emergency Medicine

## 2013-12-10 DIAGNOSIS — Z880 Allergy status to penicillin: Secondary | ICD-10-CM | POA: Insufficient documentation

## 2013-12-10 DIAGNOSIS — Z79899 Other long term (current) drug therapy: Secondary | ICD-10-CM | POA: Insufficient documentation

## 2013-12-10 DIAGNOSIS — H5711 Ocular pain, right eye: Secondary | ICD-10-CM | POA: Diagnosis not present

## 2013-12-10 DIAGNOSIS — Z8709 Personal history of other diseases of the respiratory system: Secondary | ICD-10-CM | POA: Insufficient documentation

## 2013-12-10 DIAGNOSIS — G8929 Other chronic pain: Secondary | ICD-10-CM | POA: Insufficient documentation

## 2013-12-10 DIAGNOSIS — R6889 Other general symptoms and signs: Secondary | ICD-10-CM

## 2013-12-10 DIAGNOSIS — I10 Essential (primary) hypertension: Secondary | ICD-10-CM | POA: Diagnosis not present

## 2013-12-10 DIAGNOSIS — Z8669 Personal history of other diseases of the nervous system and sense organs: Secondary | ICD-10-CM | POA: Diagnosis not present

## 2013-12-10 DIAGNOSIS — H9201 Otalgia, right ear: Secondary | ICD-10-CM | POA: Insufficient documentation

## 2013-12-10 DIAGNOSIS — R21 Rash and other nonspecific skin eruption: Secondary | ICD-10-CM | POA: Diagnosis present

## 2013-12-10 DIAGNOSIS — Z87891 Personal history of nicotine dependence: Secondary | ICD-10-CM | POA: Diagnosis not present

## 2013-12-10 DIAGNOSIS — Z7982 Long term (current) use of aspirin: Secondary | ICD-10-CM | POA: Diagnosis not present

## 2013-12-10 DIAGNOSIS — E119 Type 2 diabetes mellitus without complications: Secondary | ICD-10-CM | POA: Diagnosis not present

## 2013-12-10 DIAGNOSIS — B029 Zoster without complications: Secondary | ICD-10-CM | POA: Insufficient documentation

## 2013-12-10 MED ORDER — FLUORESCEIN SODIUM 1 MG OP STRP
1.0000 | ORAL_STRIP | Freq: Once | OPHTHALMIC | Status: AC
  Administered 2013-12-10: 1 via OPHTHALMIC
  Filled 2013-12-10: qty 1

## 2013-12-10 MED ORDER — VALACYCLOVIR HCL 500 MG PO TABS
1000.0000 mg | ORAL_TABLET | Freq: Once | ORAL | Status: AC
  Administered 2013-12-10: 1000 mg via ORAL
  Filled 2013-12-10: qty 2

## 2013-12-10 MED ORDER — HYDROCODONE-ACETAMINOPHEN 5-325 MG PO TABS: 1.0000 | ORAL_TABLET | ORAL | Status: DC | PRN

## 2013-12-10 MED ORDER — VALACYCLOVIR HCL 1 G PO TABS: 1000.0000 mg | ORAL_TABLET | Freq: Three times a day (TID) | ORAL | Status: AC

## 2013-12-10 NOTE — ED Notes (Signed)
Declined W/C at D/C and was escorted to lobby by RN. 

## 2013-12-10 NOTE — ED Provider Notes (Signed)
CSN: [id number]     Arrival date & time 12/10/13  0904 History  This chart was scribed for non-physician practitioner Marlon Peliffany Ephram Kornegay, PA-C working with Glynn OctaveStephen Rancour, MD by Conchita ParisNadim Abuhashem, ED Scribe. [facility] and the patient's care was started at 9:28 AM.   Chief Complaint  Patient presents with  . Rash   Patient is a 73 y.o. male presenting with rash. The history is provided by the patient. No language interpreter was used.  Rash  HPI Comments: Christian Morgan is a 73 y.o. male who presents to the Emergency Department complaining of a rash to the right side of his face that began yesterday morning. He notes that his rash has began spreading to his eye lids and mouth this morning. Pt right eyelid is irritated and the skin around its pain painful. Denies globe pain or change in vision. Pt notes the pain has also spread to his right ear.  Past Medical History  Diagnosis Date  . Chronic headache   . OSA (obstructive sleep apnea)   . Hypertension   . Hyperlipidemia   . Diabetes mellitus, type 1   . Allergic rhinitis    Past Surgical History  Procedure Laterality Date  . Shoulder surgery  2009    Right  . S/p crani for chi     Family History  Problem Relation Age of Onset  . Stroke Sister   . Lung cancer Brother   . Diabetes Mother   . Depression Mother   . Alcohol abuse Father    History  Substance Use Topics  . Smoking status: Former Smoker -- 0.10 packs/day    Types: Cigarettes, Cigars    Quit date: 03/02/2009  . Smokeless tobacco: Not on file     Comment: smoked an occ cigarette "for a very short time" as a teenager.  Then started smoking cigars- only occassional starting at age [age]s.   . Alcohol Use: Yes     Comment: 1-2 drinks in evenings    Review of Systems  HENT: Positive for ear pain.   Eyes: Positive for pain, redness and itching.  Skin: Positive for color change and rash.  All other systems reviewed and are  negative.  Allergies  Amitriptyline hcl; Aspirin; Doxycycline; Iodine; Levofloxacin; Lipitor; Other; and Penicillins  Home Medications   Prior to Admission medications   Medication Sig Start Date End Date Taking? Authorizing Provider  citalopram (CELEXA) 10 MG tablet Take 10 mg by mouth at bedtime.    Yes Historical Provider, MD  dutasteride (AVODART) 0.5 MG capsule Take 0.5 mg by mouth every evening.    Yes Historical Provider, MD  ezetimibe (ZETIA) 10 MG tablet Take 10 mg by mouth daily.   Yes Historical Provider, MD  fexofenadine (ALLEGRA) 60 MG tablet Take 60 mg by mouth daily as needed for allergies.    Yes Historical Provider, MD  fluticasone (FLONASE) 50 MCG/ACT nasal spray Place 1 spray into both nostrils at bedtime as needed.    Yes Historical Provider, MD  HYDROcodone-acetaminophen (NORCO) 10-325 MG per tablet Take 1 tablet by mouth every 4 (four) hours as needed (for pain).  08/25/13  Yes Historical Provider, MD  ibuprofen (ADVIL,MOTRIN) 600 MG tablet Take 1 tablet (600 mg total) by mouth 3 (three) times daily. 09/15/13  Yes Lyanne CoKevin M Campos, MD  losartan (COZAAR) 100 MG tablet Take 100 mg by mouth daily.   Yes Historical Provider, MD  metFORMIN (GLUCOPHAGE) 500 MG tablet Take 1,000  mg by mouth 2 (two) times daily.    Yes Historical Provider, MD  traMADol (ULTRAM) 50 MG tablet Take 50 mg by mouth 3 (three) times daily as needed for moderate pain.  12/09/13  Yes Historical Provider, MD  VOLTAREN 1 % GEL Apply 2 g topically daily. Apply to knees 10/19/13  Yes Historical Provider, MD  HYDROcodone-acetaminophen (NORCO/VICODIN) 5-325 MG per tablet Take 1 tablet by mouth every 4 (four) hours as needed for severe pain. 12/10/13   Andilyn Bettcher Irine SealG Nalanie Winiecki, PA-C  valACYclovir (VALTREX) 1000 MG tablet Take 1 tablet (1,000 mg total) by mouth 3 (three) times daily. 12/10/13 12/24/13  Almond Fitzgibbon Irine SealG Hubbert Landrigan, PA-C   BP 129/64  Pulse 92  Temp(Src) 98.2 F (36.8 C) (Oral)  Resp 14  Ht 5\' 6"  (1.676 m)  Wt 245 lb  3 oz (111.216 kg)  BMI 39.59 kg/m2  SpO2 96% Physical Exam  Nursing note and vitals reviewed. Constitutional: He is oriented to person, place, and time. He appears well-developed and well-nourished. No distress.  HENT:  Head: Normocephalic and atraumatic.  Lips appear swollen. Right ear pain Clear fluid filled vesicles to trigeminal nerve pattern to right side of face. No intra oral lesions. Lesions due extend down to his upper lip. NO crusting, scabs or drainage. Lesions are tender to the touch.  Positive Hutchinson's sign  Eyes: Conjunctivae and EOM are normal.  Fundoscopic exam:      The right eye shows no exudate and no hemorrhage.       The left eye shows no exudate and no hemorrhage.  Slit lamp exam:      The right eye shows no corneal abrasion, no corneal flare, no corneal ulcer, no fluorescein uptake and no anterior chamber bulge.       The left eye shows no corneal abrasion, no corneal flare, no corneal ulcer, no fluorescein uptake and no anterior chamber bulge.  Right eye is slightly irritated and red but no dendritic lesions on slit lamp exam   Neck: Normal range of motion.  Cardiovascular: Normal rate.   Pulmonary/Chest: Effort normal.  Neurological: He is alert and oriented to person, place, and time.  Skin: Skin is warm and dry.  Psychiatric: He has a normal mood and affect. His behavior is normal.   ED Course  Procedures  DIAGNOSTIC STUDIES: Oxygen Saturation is 96% on room air, adequate by my interpretation.    COORDINATION OF CARE: 9:33 AM- Will give the valtrex PO 1000 mg TID and refer him to an optometrist. Pt agreed to plan.    Labs Review Labs Reviewed - No data to display  Imaging Review No results found.   EKG Interpretation None      MDM   Final diagnoses:  Hutchinson's sign  Herpes zoster   10:21 am Dr. Manus Gunningancour has seen patient as well and also viewed his eye under the slit lamp. He agrees with my findings that no dendritic lesions or  any lesions are present on the patients globe.  Will consult eye doc to discuss optic vs no treatment to protect globe.  10:45 am I spoke with Dr. Vonna KotykBevis- opthalmology.  Patient will go straight to Dr. Vonna KotykBevis office for Urgent Evaluation of the eye. At risk for infection spreading to the eye due to positive Hutchinsons Sign. Pt and his son voice there understanding.  73 y.o.Adele SchilderJose M Correa Morgan's evaluation in the Emergency Department is complete. It has been determined that no acute conditions requiring further emergency intervention are present at  this time. The patient/guardian have been advised of the diagnosis and plan. We have discussed signs and symptoms that warrant return to the ED, such as changes or worsening in symptoms.  Vital signs are stable at discharge. Filed Vitals:   12/10/13 0912  BP: 129/64  Pulse: 92  Temp: 98.2 F (36.8 C)  Resp: 14    Patient/guardian has voiced understanding and agreed to follow-up with the PCP or specialist.    Dorthula Matas, PA-C 12/10/13 1109

## 2013-12-10 NOTE — ED Notes (Signed)
Pt stated, I have this rash on my face that started yesterday and I woke up this morning it had gone to my lips, they feel huge.

## 2013-12-10 NOTE — ED Provider Notes (Signed)
Medical screening examination/treatment/procedure(s) were conducted as a shared visit with non-physician practitioner(s) and myself.  I personally evaluated the patient during the encounter.  Vesicular rash to R face since yesterday involving eyelid, forehead,nose, lips. No dendritic lesions on slit lamp. D/w ophtho   EKG Interpretation None       Glynn OctaveStephen Matraca Hunkins, MD 12/10/13 1824

## 2013-12-10 NOTE — Discharge Instructions (Signed)

## 2013-12-20 ENCOUNTER — Ambulatory Visit: Payer: Medicare Other | Admitting: Pulmonary Disease

## 2014-02-14 ENCOUNTER — Ambulatory Visit (INDEPENDENT_AMBULATORY_CARE_PROVIDER_SITE_OTHER): Payer: Medicare Other | Admitting: Podiatry

## 2014-02-14 ENCOUNTER — Encounter: Payer: Self-pay | Admitting: Podiatry

## 2014-02-14 VITALS — BP 177/93 | HR 79 | Temp 98.2°F | Resp 15 | Ht 66.0 in | Wt 242.0 lb

## 2014-02-14 DIAGNOSIS — M79675 Pain in left toe(s): Secondary | ICD-10-CM

## 2014-02-14 DIAGNOSIS — B351 Tinea unguium: Secondary | ICD-10-CM

## 2014-02-14 NOTE — Patient Instructions (Signed)
Diabetes and Foot Care Diabetes may cause you to have problems because of poor blood supply (circulation) to your feet and legs. This may cause the skin on your feet to become thinner, break easier, and heal more slowly. Your skin may become dry, and the skin may peel and crack. You may also have nerve damage in your legs and feet causing decreased feeling in them. You may not notice minor injuries to your feet that could lead to infections or more serious problems. Taking care of your feet is one of the most important things you can do for yourself.  HOME CARE INSTRUCTIONS  Wear shoes at all times, even in the house. Do not go barefoot. Bare feet are easily injured.  Check your feet daily for blisters, cuts, and redness. If you cannot see the bottom of your feet, use a mirror or ask someone for help.  Wash your feet with warm water (do not use hot water) and mild soap. Then pat your feet and the areas between your toes until they are completely dry. Do not soak your feet as this can dry your skin.  Apply a moisturizing lotion or petroleum jelly (that does not contain alcohol and is unscented) to the skin on your feet and to dry, brittle toenails. Do not apply lotion between your toes.  Trim your toenails straight across. Do not dig under them or around the cuticle. File the edges of your nails with an emery board or nail file.  Do not cut corns or calluses or try to remove them with medicine.  Wear clean socks or stockings every day. Make sure they are not too tight. Do not wear knee-high stockings since they may decrease blood flow to your legs.  Wear shoes that fit properly and have enough cushioning. To break in new shoes, wear them for just a few hours a day. This prevents you from injuring your feet. Always look in your shoes before you put them on to be sure there are no objects inside.  Do not cross your legs. This may decrease the blood flow to your feet.  If you find a minor scrape,  cut, or break in the skin on your feet, keep it and the skin around it clean and dry. These areas may be cleansed with mild soap and water. Do not cleanse the area with peroxide, alcohol, or iodine.  When you remove an adhesive bandage, be sure not to damage the skin around it.  If you have a wound, look at it several times a day to make sure it is healing.  Do not use heating pads or hot water bottles. They may burn your skin. If you have lost feeling in your feet or legs, you may not know it is happening until it is too late.  Make sure your health care provider performs a complete foot exam at least annually or more often if you have foot problems. Report any cuts, sores, or bruises to your health care provider immediately. SEEK MEDICAL CARE IF:   You have an injury that is not healing.  You have cuts or breaks in the skin.  You have an ingrown nail.  You notice redness on your legs or feet.  You feel burning or tingling in your legs or feet.  You have pain or cramps in your legs and feet.  Your legs or feet are numb.  Your feet always feel cold. SEEK IMMEDIATE MEDICAL CARE IF:   There is increasing redness,   swelling, or pain in or around a wound.  There is a red line that goes up your leg.  Pus is coming from a wound.  You develop a fever or as directed by your health care provider.  You notice a bad smell coming from an ulcer or wound. Document Released: 02/14/2000 Document Revised: 10/19/2012 Document Reviewed: 07/26/2012 Exit[employer] Patient Information 2015 [employer]. This information is not intended to replace advice given to you by your health care provider. Make sure you discuss any questions you have with your health care provider.  

## 2014-02-14 NOTE — Progress Notes (Signed)
   Subjective:    Patient ID: Christian Morgan, male    DOB: [birth year], 73 y.o.   MRN: [id number]  HPI Comments: N ingrown toenail L left 1st lateral toenail border D and O 1 month C encurvated, thick painful A diabetes, difficult to cut T anti-fungal medication on the toenail due to gray color and antibiotic ointment with gauze for the pain and swelling     Review of Systems  HENT: Positive for congestion and hearing loss.   Musculoskeletal: Positive for back pain and arthralgias.       Shingles pain  Neurological: Positive for dizziness.       Objective:   Physical Exam  Orientated 3  Vascular: DP left 0/4 DP right 2/4 PT pulses 2/4 bilaterally Capillary reflex immediate bilaterally  Neurological: Sensation to 10 g monofilament wire intact 5/5 bilaterally Ankle reflex equal and reactive bilaterally Vibratory sensation intact bilaterally  Dermatological: The left hallux nail has texture and color changes with mild incurvation along the lateral margin. There is no erythema, edema or drainage noted around the left hallux nail. Remaining toenails have occasional texture and color changes  Musculoskeletal: Pes planus bilaterally Hammertoe deformity second right No restriction ankle, subtalar, midtarsal joints bilaterally         Assessment & Plan:   Assessment: Satisfactory neurovascular status Mycotic ingrowing left hallux toenail without clinical sign of bacterial infection  Plan: Debrided the left hallux nail without any bleeding. No drainage noted post debridement  Patient advised to return as needed for debridement or yearly for diabetic foot exam

## 2015-03-08 ENCOUNTER — Other Ambulatory Visit: Payer: Self-pay | Admitting: Neurosurgery

## 2015-03-08 DIAGNOSIS — M4316 Spondylolisthesis, lumbar region: Secondary | ICD-10-CM

## 2015-03-14 ENCOUNTER — Other Ambulatory Visit: Payer: Self-pay

## 2015-03-18 ENCOUNTER — Ambulatory Visit
Admission: RE | Admit: 2015-03-18 | Discharge: 2015-03-18 | Disposition: A | Discharge status: Home / Self Care | Payer: Medicare Other | Source: Ambulatory Visit | Attending: Neurosurgery | Admitting: Neurosurgery

## 2015-03-18 DIAGNOSIS — M4316 Spondylolisthesis, lumbar region: Secondary | ICD-10-CM

## 2015-03-18 MED ORDER — DIPHENHYDRAMINE HCL 25 MG PO CAPS
25.0000 mg | ORAL_CAPSULE | Freq: Once | ORAL | Status: AC
  Administered 2015-03-18: 25 mg via ORAL

## 2015-03-18 MED ORDER — IOHEXOL 180 MG/ML  SOLN
1.0000 mL | Freq: Once | INTRAMUSCULAR | Status: AC | PRN
  Administered 2015-03-18: 1 mL via INTRA_ARTICULAR

## 2015-03-18 MED ORDER — METHYLPREDNISOLONE ACETATE 40 MG/ML INJ SUSP (RADIOLOG
120.0000 mg | Freq: Once | INTRAMUSCULAR | Status: AC
  Administered 2015-03-18: 40 mg via INTRA_ARTICULAR

## 2015-03-18 NOTE — Discharge Instructions (Signed)

## 2017-01-05 DIAGNOSIS — R400 Somnolence: Secondary | ICD-10-CM

## 2017-01-05 HISTORY — DX: Somnolence: R40.0

## 2017-01-06 ENCOUNTER — Emergency Department (HOSPITAL_COMMUNITY)
Admission: EM | Admit: 2017-01-06 | Discharge: 2017-01-06 | Disposition: A | Discharge status: Home / Self Care | Payer: Medicare Other | Attending: Emergency Medicine | Admitting: Emergency Medicine

## 2017-01-06 ENCOUNTER — Emergency Department (HOSPITAL_COMMUNITY): Payer: Medicare Other

## 2017-01-06 ENCOUNTER — Encounter (HOSPITAL_COMMUNITY): Payer: Self-pay | Admitting: *Deleted

## 2017-01-06 DIAGNOSIS — Z981 Arthrodesis status: Secondary | ICD-10-CM | POA: Insufficient documentation

## 2017-01-06 DIAGNOSIS — Y9384 Activity, sleeping: Secondary | ICD-10-CM | POA: Diagnosis not present

## 2017-01-06 DIAGNOSIS — S79912A Unspecified injury of left hip, initial encounter: Secondary | ICD-10-CM | POA: Diagnosis present

## 2017-01-06 DIAGNOSIS — Z7984 Long term (current) use of oral hypoglycemic drugs: Secondary | ICD-10-CM | POA: Diagnosis not present

## 2017-01-06 DIAGNOSIS — Z87891 Personal history of nicotine dependence: Secondary | ICD-10-CM | POA: Insufficient documentation

## 2017-01-06 DIAGNOSIS — Z23 Encounter for immunization: Secondary | ICD-10-CM | POA: Insufficient documentation

## 2017-01-06 DIAGNOSIS — Y92019 Unspecified place in single-family (private) house as the place of occurrence of the external cause: Secondary | ICD-10-CM | POA: Insufficient documentation

## 2017-01-06 DIAGNOSIS — I10 Essential (primary) hypertension: Secondary | ICD-10-CM | POA: Insufficient documentation

## 2017-01-06 DIAGNOSIS — S7002XA Contusion of left hip, initial encounter: Secondary | ICD-10-CM | POA: Insufficient documentation

## 2017-01-06 DIAGNOSIS — W07XXXA Fall from chair, initial encounter: Secondary | ICD-10-CM | POA: Insufficient documentation

## 2017-01-06 DIAGNOSIS — W19XXXA Unspecified fall, initial encounter: Secondary | ICD-10-CM

## 2017-01-06 DIAGNOSIS — Y998 Other external cause status: Secondary | ICD-10-CM | POA: Insufficient documentation

## 2017-01-06 DIAGNOSIS — E109 Type 1 diabetes mellitus without complications: Secondary | ICD-10-CM | POA: Insufficient documentation

## 2017-01-06 DIAGNOSIS — R51 Headache: Secondary | ICD-10-CM | POA: Insufficient documentation

## 2017-01-06 DIAGNOSIS — M545 Low back pain: Secondary | ICD-10-CM | POA: Diagnosis not present

## 2017-01-06 MED ORDER — TETANUS-DIPHTH-ACELL PERTUSSIS 5-2.5-18.5 LF-MCG/0.5 IM SUSP
0.5000 mL | Freq: Once | INTRAMUSCULAR | Status: AC
  Administered 2017-01-06: 0.5 mL via INTRAMUSCULAR
  Filled 2017-01-06: qty 0.5

## 2017-01-06 MED ORDER — OXYCODONE HCL 5 MG PO TABS
5.0000 mg | ORAL_TABLET | Freq: Once | ORAL | Status: AC
  Administered 2017-01-06: 5 mg via ORAL
  Filled 2017-01-06: qty 1

## 2017-01-06 MED ORDER — CYCLOBENZAPRINE HCL 10 MG PO TABS: 10.0000 mg | ORAL_TABLET | Freq: Two times a day (BID) | ORAL | 0 refills | Status: DC | PRN

## 2017-01-06 NOTE — ED Notes (Signed)
Family reports that around 2-3 am pt had a fall from his computer chair. Pt reports that he took one sleeping pill and thinks he fell asleep sitting up. He reports remembering everything from the time of hitting the ground, denies LOC, denies hitting his head.

## 2017-01-06 NOTE — ED Notes (Signed)
Pt transported back to ED on stretcher with tech, tolerated well.  

## 2017-01-06 NOTE — ED Notes (Signed)
ED Provider at bedside. 

## 2017-01-06 NOTE — ED Triage Notes (Signed)
Per pt and family member, pt took a sleeping pill last night and then fell asleep while sitting in a computer chair. The chair then slid out from under him and he fell backwards and now has left side lower back pain. No acute distress is noted at this time.

## 2017-01-06 NOTE — ED Provider Notes (Signed)
[facility] EMERGENCY DEPARTMENT Provider Note   CSN: [id number] Arrival date & time: 01/06/17  2130     History   Chief Complaint Chief Complaint  Patient presents with  . Fall  . Back Pain    HPI Alphonsa Brickle Keeshawn Fakhouri is a 76 y.o. male.  HPI   Has had sleep apnea, fell asleep in the chair last night, a desk chair, thinks it may have reclined back and he fell on floor.  He took sleeping pills.  Found him at 2 in the morning.  Was able to get up and walk.  Had cut left elbow and put neosporin and bandage.  Landed on back, having left lower back and left hip pain. Had hx of back surgery 2 years ago.  Doesn't think hit head, not having any new headaches.  Hip was location of pain more severe pain. Took tylenol this AM but pain continues.  Denies head trauma, new headache, neck pain, numbness or weakness.  Reports that he has had sleep apnea and some headaches since his wife's bypass surgery.    Past Medical History:  Diagnosis Date  . Allergic rhinitis   . Chronic headache   . Diabetes mellitus, type 1   . Hyperlipidemia   . Hypertension   . OSA (obstructive sleep apnea)     Patient Active Problem List   Diagnosis Date Noted  . DIABETES, TYPE 1 10/31/2009  . HYPERLIPIDEMIA 10/31/2009  . PERSISTENT DISORDER INITIATING/MAINTAINING SLEEP 10/31/2009  . OBSTRUCTIVE SLEEP APNEA 10/31/2009  . HYPERTENSION 10/31/2009  . Chronic rhinitis 10/31/2009  . HEADACHE, CHRONIC 10/31/2009    Past Surgical History:  Procedure Laterality Date  . BACK SURGERY    . S/p Crani for CHI    . SHOULDER SURGERY  2009   Right       Home Medications    Prior to Admission medications   Medication Sig Start Date End Date Taking? Authorizing Provider  citalopram (CELEXA) 10 MG tablet Take 10 mg by mouth at bedtime.     [provider]  cyclobenzaprine (FLEXERIL) 10 MG tablet Take 1 tablet (10 mg total) 2 (two) times daily as needed by mouth for muscle spasms.  01/06/17   Alvira Monday, MD  dutasteride (AVODART) 0.5 MG capsule Take 0.5 mg by mouth every evening.     [provider]  ezetimibe (ZETIA) 10 MG tablet Take 10 mg by mouth daily.    [provider]  fexofenadine (ALLEGRA) 60 MG tablet Take 60 mg by mouth daily as needed for allergies.     [provider]  fluticasone (FLONASE) 50 MCG/ACT nasal spray Place 1 spray into both nostrils at bedtime as needed.     [provider]  HYDROcodone-acetaminophen (NORCO) 10-325 MG per tablet Take 1 tablet by mouth every 4 (four) hours as needed (for pain).  08/25/13   [provider]  HYDROcodone-acetaminophen (NORCO/VICODIN) 5-325 MG per tablet Take 1 tablet by mouth every 4 (four) hours as needed for severe pain. 12/10/13   Marlon Pel, PA-C  ibuprofen (ADVIL,MOTRIN) 600 MG tablet Take 1 tablet (600 mg total) by mouth 3 (three) times daily. 09/15/13   Azalia Bilis, MD  losartan (COZAAR) 100 MG tablet Take 100 mg by mouth daily.    [provider]  metFORMIN (GLUCOPHAGE) 500 MG tablet Take 1,000 mg by mouth 2 (two) times daily.     [provider]  Pregabalin (LYRICA PO) Take by mouth.    [provider]  VOLTAREN 1 % GEL Apply 2 g topically daily. Apply to knees 10/19/13   [provider]    Family History Family History  Problem Relation Age of Onset  . Stroke Sister   . Lung cancer Brother   . Diabetes Mother   . Depression Mother   . Alcohol abuse Father     Social History Social History   Tobacco Use  . Smoking status: Former Smoker    Packs/day: 0.10    Types: Cigarettes, Cigars    Last attempt to quit: 03/02/2009    Years since quitting: 7.8  . Smokeless tobacco: Never Used  . Tobacco comment: smoked an occ cigarette "for a very short time" as a teenager.  Then started smoking cigars- only occassional starting at age [age]s.   Substance Use Topics  . Alcohol use: Yes    Comment: 1-2 drinks in evenings    . Drug use: Not on file     Allergies   Lipitor [atorvastatin]; Penicillins; Amitriptyline hcl; Other; Aspirin; Doxycycline; Iodinated diagnostic agents; and Levofloxacin   Review of Systems Review of Systems  Constitutional: Negative for fever.  HENT: Negative for sore throat.   Eyes: Negative for visual disturbance.  Respiratory: Negative for shortness of breath.   Cardiovascular: Negative for chest pain.  Gastrointestinal: Negative for abdominal pain, nausea and vomiting.  Genitourinary: Negative for difficulty urinating.  Musculoskeletal: Positive for arthralgias and back pain. Negative for neck pain and neck stiffness.  Skin: Negative for rash.  Neurological: Positive for headaches (reports mild headaches since wife's surgery, no new headache or worsening today). Negative for syncope, facial asymmetry, weakness and numbness.     Physical Exam Updated Vital Signs BP (!) 143/67   Pulse 70   Temp 98.3 F (36.8 C) (Oral)   Resp 20   Ht 5\' 5"  (1.651 m)   Wt 108.9 kg (240 lb)   SpO2 100%   BMI 39.94 kg/m   Physical Exam  Constitutional: He is oriented to person, place, and time. He appears well-developed and well-nourished. No distress.  HENT:  Head: Normocephalic and atraumatic. Head is without raccoon's eyes, without Battle's sign and without contusion.  No hemotympanum  Eyes: Conjunctivae and EOM are normal.  Neck: Normal range of motion.  Cardiovascular: Normal rate, regular rhythm, normal heart sounds and intact distal pulses. Exam reveals no gallop and no friction rub.  No murmur heard. Pulmonary/Chest: Effort normal and breath sounds normal. No respiratory distress. He has no wheezes. He has no rales.  Abdominal: Soft. He exhibits no distension. There is no tenderness. There is no guarding.  Musculoskeletal: He exhibits no edema.       Left hip: He exhibits tenderness and bony tenderness. He exhibits normal range of motion (pain with movements).       Cervical  back: He exhibits no bony tenderness.       Thoracic back: He exhibits no bony tenderness.       Lumbar back: He exhibits no bony tenderness.  Neurological: He is alert and oriented to person, place, and time. He has normal strength. No cranial nerve deficit. GCS eye subscore is 4. GCS verbal subscore is 5. GCS motor subscore is 6.  Skin: Skin is warm and dry. He is not diaphoretic.  Nursing note and vitals reviewed.    ED Treatments / Results  Labs (all labs ordered are listed, but only abnormal results are displayed) Labs Reviewed - No data to display  EKG  EKG  Interpretation None       Radiology Dg Lumbar Spine Complete  Result Date: 01/06/2017 CLINICAL DATA:  Status post fall. Left low back pain. Left hip pain. EXAM: LUMBAR SPINE - COMPLETE 4+ VIEW COMPARISON:  None. FINDINGS: There is no evidence of lumbar spine fracture. There is 3 mm of retrolisthesis of L1 on L2 and L2 on L3. There is mild degenerative disc disease with disc height loss throughout the lumbar spine. Abdominal aortic atherosclerosis. IMPRESSION: No acute osseous injury of the lumbar spine. Electronically Signed   By: Elige KoHetal  Patel   On: 01/06/2017 11:47   Dg Hip Unilat W Or Wo Pelvis 2-3 Views Left  Result Date: 01/06/2017 CLINICAL DATA:  Left-sided low back pain and left lateral hip pain secondary to a fall at home yesterday. EXAM: DG HIP (WITH OR WITHOUT PELVIS) 2-3V LEFT COMPARISON:  Radiograph dated 08/13/2015 FINDINGS: There is no evidence of hip fracture or dislocation. There is no evidence of arthropathy or other focal bone abnormality. Bones of the pelvis appear normal. Slight calcification in the gluteal tendon insertions on the left greater trochanter. IMPRESSION: No acute abnormalities. Electronically Signed   By: Francene BoyersJames  Maxwell M.D.   On: 01/06/2017 11:50    Procedures Procedures (including critical care time)  Medications Ordered in ED Medications  oxyCODONE (Oxy IR/ROXICODONE) immediate release  tablet 5 mg (5 mg Oral Given 01/06/17 1105)  Tdap (BOOSTRIX) injection 0.5 mL (0.5 mLs Intramuscular Given 01/06/17 1058)     Initial Impression / Assessment and Plan / ED Course  I have reviewed the triage vital signs and the nursing notes.  Pertinent labs & imaging results that were available during my care of the patient were reviewed by me and considered in my medical decision making (see chart for details).     76 year old male with history above presents with concern for falling asleep in a computer chair and falling to the floor last night.  Denies new headache, head trauma, has no signs of skull fracture on exam, no nausea, no vomiting, no neurologic deficits and have low suspicion for acute intracranial injury.  He does report he has had some headaches since his wife's bypass surgery, as well as concern for sleep apnea, and recommend continued outpatient follow-up regarding these symptoms.  Denies neck pain, midline neck tenderness.  Patient's concern is left-sided hip pain.  X-ray of the lumbar spine and left hip show no sign of acute fracture.  Discussed that pain may be radicular from the back, or may also represent contusion.   Patient had been bearing weight and able to ambulate with cane, and have low suspicion for occult fracture.  Recommend Tylenol, muscle relaxant for pain, and follow-up with primary care physician. Patient discharged in stable condition with understanding of reasons to return.   Final Clinical Impressions(s) / ED Diagnoses   Final diagnoses:  Fall, initial encounter  Contusion of left hip, initial encounter    ED Discharge Orders        Ordered    cyclobenzaprine (FLEXERIL) 10 MG tablet  2 times daily PRN     01/06/17 1255       Alvira MondaySchlossman, Cordarro Spinnato, MD 01/06/17 1906

## 2017-01-15 ENCOUNTER — Encounter (HOSPITAL_BASED_OUTPATIENT_CLINIC_OR_DEPARTMENT_OTHER): Payer: Self-pay

## 2017-01-15 DIAGNOSIS — R0981 Nasal congestion: Secondary | ICD-10-CM

## 2017-01-15 DIAGNOSIS — R413 Other amnesia: Secondary | ICD-10-CM

## 2017-01-15 DIAGNOSIS — R400 Somnolence: Secondary | ICD-10-CM

## 2017-01-22 ENCOUNTER — Other Ambulatory Visit: Payer: Self-pay

## 2017-01-22 ENCOUNTER — Encounter (HOSPITAL_COMMUNITY): Payer: Self-pay | Admitting: Emergency Medicine

## 2017-01-22 DIAGNOSIS — Z87891 Personal history of nicotine dependence: Secondary | ICD-10-CM | POA: Insufficient documentation

## 2017-01-22 DIAGNOSIS — I160 Hypertensive urgency: Secondary | ICD-10-CM | POA: Insufficient documentation

## 2017-01-22 DIAGNOSIS — R51 Headache: Secondary | ICD-10-CM | POA: Diagnosis present

## 2017-01-22 DIAGNOSIS — I10 Essential (primary) hypertension: Secondary | ICD-10-CM | POA: Insufficient documentation

## 2017-01-22 DIAGNOSIS — Z79899 Other long term (current) drug therapy: Secondary | ICD-10-CM | POA: Diagnosis not present

## 2017-01-22 DIAGNOSIS — Z7984 Long term (current) use of oral hypoglycemic drugs: Secondary | ICD-10-CM | POA: Insufficient documentation

## 2017-01-22 LAB — BASIC METABOLIC PANEL
Anion gap: 10 (ref 5–15)
BUN: 17 mg/dL (ref 6–20)
CHLORIDE: 101 mmol/L (ref 101–111)
CO2: 23 mmol/L (ref 22–32)
Calcium: 10.3 mg/dL (ref 8.9–10.3)
Creatinine, Ser: 1.04 mg/dL (ref 0.61–1.24)
GFR calc Af Amer: >60 mL/min (ref >60)
GLUCOSE: 100 mg/dL — AB (ref 65–99)
POTASSIUM: 3.9 mmol/L (ref 3.5–5.1)
Sodium: 134 mmol/L — ABNORMAL LOW (ref 135–145)

## 2017-01-22 LAB — CBC
HEMATOCRIT: 38.2 % — AB (ref 39.0–52.0)
HEMOGLOBIN: 13.1 g/dL (ref 13.0–17.0)
MCH: 30.5 pg (ref 26.0–34.0)
MCHC: 34.3 g/dL (ref 30.0–36.0)
MCV: 88.8 fL (ref 78.0–100.0)
Platelets: 322 10*3/uL (ref 150–400)
RBC: 4.3 MIL/uL (ref 4.22–5.81)
RDW: 13.4 % (ref 11.5–15.5)
WBC: 10.7 10*3/uL — ABNORMAL HIGH (ref 4.0–10.5)

## 2017-01-22 LAB — URINALYSIS, ROUTINE W REFLEX MICROSCOPIC
BILIRUBIN URINE: NEGATIVE
GLUCOSE, UA: NEGATIVE mg/dL
Hgb urine dipstick: NEGATIVE
Ketones, ur: NEGATIVE mg/dL
Leukocytes, UA: NEGATIVE
NITRITE: NEGATIVE
PH: 5 (ref 5.0–8.0)
Protein, ur: NEGATIVE mg/dL
SPECIFIC GRAVITY, URINE: 1.019 (ref 1.005–1.030)

## 2017-01-22 NOTE — ED Notes (Addendum)
Patient is feeling light headed and dizzy at the moment. I rechecked the patients vital signs of the patient and reported those values to Suzy BouchardKaren N, Charity fundraiserN. Patient expressed wishes of having a snack.

## 2017-01-22 NOTE — ED Triage Notes (Signed)
Pt presents with concerns regarding BP, pt states BP 200/101, pt reports HA x 2 days with some lightheadedness.  A & O in triage. No neuro deficits

## 2017-01-23 ENCOUNTER — Emergency Department (HOSPITAL_COMMUNITY): Payer: Medicare Other

## 2017-01-23 ENCOUNTER — Encounter (HOSPITAL_COMMUNITY): Payer: Self-pay | Admitting: Internal Medicine

## 2017-01-23 ENCOUNTER — Emergency Department (HOSPITAL_COMMUNITY)
Admission: EM | Admit: 2017-01-23 | Discharge: 2017-01-23 | Disposition: A | Discharge status: Home / Self Care | Payer: Medicare Other | Attending: Emergency Medicine | Admitting: Emergency Medicine

## 2017-01-23 ENCOUNTER — Encounter (HOSPITAL_BASED_OUTPATIENT_CLINIC_OR_DEPARTMENT_OTHER): Payer: Medicare Other

## 2017-01-23 DIAGNOSIS — I160 Hypertensive urgency: Secondary | ICD-10-CM

## 2017-01-23 HISTORY — DX: Unspecified hearing loss, unspecified ear: H91.90

## 2017-01-23 LAB — CBG MONITORING, ED: GLUCOSE-CAPILLARY: 152 mg/dL — AB (ref 65–99)

## 2017-01-23 MED ORDER — CLONIDINE HCL 0.1 MG PO TABS: 0.1000 mg | ORAL_TABLET | Freq: Two times a day (BID) | ORAL | 0 refills | Status: DC

## 2017-01-23 MED ORDER — CLONIDINE HCL 0.2 MG PO TABS
0.2000 mg | ORAL_TABLET | Freq: Once | ORAL | Status: AC
  Administered 2017-01-23: 0.2 mg via ORAL
  Filled 2017-01-23: qty 1

## 2017-01-23 NOTE — ED Provider Notes (Signed)
[facility]   CSN: [id number] Arrival date & time: 01/22/17  2257     History   Chief Complaint Chief Complaint  Patient presents with  . Hypertension    HPI [facility] LLCJose M Jefm PettyCorrea Morgan is a 76 y.o. male.  Patient is a 76 year old male with past medical history of diabetes, hypertension presenting for evaluation of headache and elevated blood pressure.  This is been ongoing for the past several days.  He does report waking up with headaches for the past week and a half.  He describes this as a pressure in his head that seems to improve as the day goes on.  He has been checking his blood pressure the last 2 days and has been getting readings in the 180s systolic.  This evening it was over 200 and presents for evaluation of it.  He does report not getting much sleep and increased stress as his wife was recently hospitalized and underwent bypass surgery.  He denies any change to his antihypertensive regimen.  He reports being compliant with his medications.   The history is provided by the patient.  Hypertension  This is a new problem. Episode onset: Several days ago. The problem occurs constantly. The problem has been gradually worsening. Associated symptoms include headaches. Pertinent negatives include no chest pain, no abdominal pain and no shortness of breath. Nothing aggravates the symptoms. Nothing relieves the symptoms. He has tried nothing for the symptoms.    Past Medical History:  Diagnosis Date  . Allergic rhinitis   . Chronic headache   . Diabetes mellitus, type 1   . Hard of hearing   . Hyperlipidemia   . Hypertension   . OSA (obstructive sleep apnea)     Patient Active Problem List   Diagnosis Date Noted  . DIABETES, TYPE 1 10/31/2009  . HYPERLIPIDEMIA 10/31/2009  . PERSISTENT DISORDER INITIATING/MAINTAINING SLEEP 10/31/2009  . OBSTRUCTIVE SLEEP APNEA 10/31/2009  . HYPERTENSION 10/31/2009  . Chronic rhinitis  10/31/2009  . HEADACHE, CHRONIC 10/31/2009    Past Surgical History:  Procedure Laterality Date  . BACK SURGERY    . S/p Crani for CHI    . SHOULDER SURGERY  2009   Right       Home Medications    Prior to Admission medications   Medication Sig Start Date End Date Taking? Authorizing Provider  citalopram (CELEXA) 10 MG tablet Take 10 mg by mouth at bedtime.     [provider]  cyclobenzaprine (FLEXERIL) 10 MG tablet Take 1 tablet (10 mg total) 2 (two) times daily as needed by mouth for muscle spasms. 01/06/17   Alvira MondaySchlossman, Erin, MD  dutasteride (AVODART) 0.5 MG capsule Take 0.5 mg by mouth every evening.     [provider]  ezetimibe (ZETIA) 10 MG tablet Take 10 mg by mouth daily.    [provider]  fexofenadine (ALLEGRA) 60 MG tablet Take 60 mg by mouth daily as needed for allergies.     [provider]  fluticasone (FLONASE) 50 MCG/ACT nasal spray Place 1 spray into both nostrils at bedtime as needed.     [provider]  HYDROcodone-acetaminophen (NORCO) 10-325 MG per tablet Take 1 tablet by mouth every 4 (four) hours as needed (for pain).  08/25/13   [provider]  HYDROcodone-acetaminophen (NORCO/VICODIN) 5-325 MG per tablet Take 1 tablet by mouth every 4 (four) hours as needed for severe pain. 12/10/13   Marlon PelGreene, Tiffany, PA-C  ibuprofen (ADVIL,MOTRIN)  600 MG tablet Take 1 tablet (600 mg total) by mouth 3 (three) times daily. 09/15/13   Azalia Bilisampos, Kevin, MD  losartan (COZAAR) 100 MG tablet Take 100 mg by mouth daily.    [provider]  metFORMIN (GLUCOPHAGE) 500 MG tablet Take 1,000 mg by mouth 2 (two) times daily.     [provider]  Pregabalin (LYRICA PO) Take by mouth.    [provider]  VOLTAREN 1 % GEL Apply 2 g topically daily. Apply to knees 10/19/13   [provider]    Family History Family History  Problem Relation Age of Onset  . Stroke Sister   . Lung cancer Brother   .  Diabetes Mother   . Depression Mother   . Alcohol abuse Father     Social History Social History   Tobacco Use  . Smoking status: Former Smoker    Packs/day: 0.10    Types: Cigarettes, Cigars    Last attempt to quit: 03/02/2009    Years since quitting: 7.9  . Smokeless tobacco: Never Used  . Tobacco comment: smoked an occ cigarette "for a very short time" as a teenager.  Then started smoking cigars- only occassional starting at age [age]s.   Substance Use Topics  . Alcohol use: Yes    Comment: 1-2 drinks in evenings  . Drug use: No     Allergies   Lipitor [atorvastatin]; Penicillins; Amitriptyline hcl; Other; Aspirin; Doxycycline; Iodinated diagnostic agents; and Levofloxacin   Review of Systems Review of Systems  Respiratory: Negative for shortness of breath.   Cardiovascular: Negative for chest pain.  Gastrointestinal: Negative for abdominal pain.  Neurological: Positive for headaches.  All other systems reviewed and are negative.    Physical Exam Updated Vital Signs BP (!) 181/80 (BP Location: Left Arm)   Pulse 86   Temp 97.8 F (36.6 C) (Oral)   Resp 16   Ht 5\' 5"  (1.651 m)   Wt 110.2 kg (243 lb)   SpO2 98%   BMI 40.44 kg/m   Physical Exam  Constitutional: He is oriented to person, place, and time. He appears well-developed and well-nourished. No distress.  HENT:  Head: Normocephalic and atraumatic.  Mouth/Throat: Oropharynx is clear and moist.  Eyes: EOM are normal. Pupils are equal, round, and reactive to light.  Neck: Normal range of motion. Neck supple.  Cardiovascular: Normal rate and regular rhythm. Exam reveals no friction rub.  No murmur heard. Pulmonary/Chest: Effort normal and breath sounds normal. No respiratory distress. He has no wheezes. He has no rales.  Abdominal: Soft. Bowel sounds are normal. He exhibits no distension. There is no tenderness.  Musculoskeletal: Normal range of motion. He exhibits no edema.  Neurological: He is alert and  oriented to person, place, and time. No cranial nerve deficit. He exhibits normal muscle tone. Coordination normal.  Skin: Skin is warm and dry. He is not diaphoretic.  Nursing note and vitals reviewed.    ED Treatments / Results  Labs (all labs ordered are listed, but only abnormal results are displayed) Labs Reviewed  BASIC METABOLIC PANEL - Abnormal; Notable for the following components:      Result Value   Sodium 134 (*)    Glucose, Bld 100 (*)    All other components within normal limits  CBC - Abnormal; Notable for the following components:   WBC 10.7 (*)    HCT 38.2 (*)    All other components within normal limits  CBG MONITORING, ED - Abnormal;  Notable for the following components:   Glucose-Capillary 152 (*)    All other components within normal limits  URINALYSIS, ROUTINE W REFLEX MICROSCOPIC    EKG  EKG Interpretation  Date/Time:  Friday January 22 2017 23:26:37 EST Ventricular Rate:  81 PR Interval:  174 QRS Duration: 100 QT Interval:  364 QTC Calculation: 422 R Axis:   -55 Text Interpretation:  Normal sinus rhythm Incomplete right bundle branch block Left anterior fascicular block Possible Anterior infarct , age undetermined Abnormal ECG Confirmed by Geoffery Lyons ([id number]) on 01/23/2017 1:16:23 AM       Radiology No results found.  Procedures Procedures (including critical care time)  Medications Ordered in ED Medications - No data to display   Initial Impression / Assessment and Plan / ED Course  I have reviewed the triage vital signs and the nursing notes.  Pertinent labs & imaging results that were available during my care of the patient were reviewed by me and considered in my medical decision making (see chart for details).  Patient presenting with headaches and elevated blood pressure over the past several days.  His blood pressure was over 200 systolic at home and his family brings him for evaluation of this.  His workup reveals an unchanged  EKG, unremarkable laboratory studies, and normal head CT.  He was given a dose of clonidine and his blood pressure has responded nicely.  With no signs of endorgan damage, I see no indication for admission and believe he is appropriate for discharge.  I will prescribe a small quantity of clonidine which he can take if his blood pressures remain elevated at home.  He is to keep a record of his blood pressures and follow-up with his primary doctor to discuss his medications.  I am unsure as to what is driving his blood pressure upward, however he does admit to mental stress due to his wife's recent illness.  He reports he has not been sleeping well and this may well be the cause.  Final Clinical Impressions(s) / ED Diagnoses   Final diagnoses:  None    ED Discharge Orders    None       Geoffery Lyons, MD 01/23/17 [id number]

## 2017-01-23 NOTE — ED Notes (Signed)
Pt verbalized understanding of d/c instructions and has no further questions. Pt is stable, A&Ox4, VSS.  

## 2017-01-23 NOTE — Discharge Instructions (Signed)
Clonidine as prescribed as needed for blood pressure greater than 175 systolic.  Keep a record of your blood pressures and take this with you to your next doctor's appointment.  He should occur within the next week.

## 2017-05-13 ENCOUNTER — Other Ambulatory Visit: Payer: Self-pay | Admitting: Internal Medicine

## 2017-05-13 DIAGNOSIS — I358 Other nonrheumatic aortic valve disorders: Secondary | ICD-10-CM

## 2017-05-17 ENCOUNTER — Other Ambulatory Visit: Payer: Self-pay

## 2017-05-17 ENCOUNTER — Ambulatory Visit (HOSPITAL_COMMUNITY): Payer: Medicare Other | Attending: Cardiovascular Disease

## 2017-05-17 DIAGNOSIS — E785 Hyperlipidemia, unspecified: Secondary | ICD-10-CM | POA: Diagnosis not present

## 2017-05-17 DIAGNOSIS — G4733 Obstructive sleep apnea (adult) (pediatric): Secondary | ICD-10-CM | POA: Insufficient documentation

## 2017-05-17 DIAGNOSIS — E119 Type 2 diabetes mellitus without complications: Secondary | ICD-10-CM | POA: Insufficient documentation

## 2017-05-17 DIAGNOSIS — I358 Other nonrheumatic aortic valve disorders: Secondary | ICD-10-CM | POA: Insufficient documentation

## 2017-05-17 DIAGNOSIS — I10 Essential (primary) hypertension: Secondary | ICD-10-CM | POA: Diagnosis not present

## 2017-09-14 ENCOUNTER — Encounter: Payer: Self-pay | Admitting: Registered"

## 2017-09-14 ENCOUNTER — Encounter: Payer: Medicare Other | Attending: Internal Medicine | Admitting: Registered"

## 2017-09-14 DIAGNOSIS — E11649 Type 2 diabetes mellitus with hypoglycemia without coma: Secondary | ICD-10-CM | POA: Diagnosis not present

## 2017-09-14 DIAGNOSIS — Z713 Dietary counseling and surveillance: Secondary | ICD-10-CM | POA: Insufficient documentation

## 2017-09-14 DIAGNOSIS — E1165 Type 2 diabetes mellitus with hyperglycemia: Secondary | ICD-10-CM

## 2017-09-14 NOTE — Patient Instructions (Signed)
Consider checking your blood sugar before eating breakfast (fasting) and while you are adding more carbs into your diet, check 2 hrs after meals to see how you are doing with it. Try increasing carbs to 45-60 per meal, 15-30 grams for snacks. Be sure to include protein with meals and snacks. If you notice consistent lower blood sugar numbers contact your doctor to review your medications. Consider adding protein to your breakfast cereal. You might try Fairlife milk, lactose free and higher in protein. Oatmeal and walnuts are a great thing to include as well. Take your multivitamin daily Take your diabetes medications regardless of your blood sugar readings

## 2017-09-14 NOTE — Progress Notes (Signed)
Diabetes Self-Management Education  Visit Type: First/Initial  Appt. Start Time: 0930 Appt. End Time: 1100  09/20/2017  Christian Morgan, identified by name and date of birth, is a 77 y.o. male with a diagnosis of Diabetes: Type 2.  This patient is accompanied in the office by his son.   ASSESSMENT Per chart: A1c 6.8% 04/2017..was 8.3%. Pt states he has cpap, was using daily but rarely now and finds that a humidifier helps him to breathe better at night.  Patient states he watches sugar/carbs and tries to eat more meat, veg, nuts. Pt states he lost 25 lb after reducing candy intake. Pt states he if his BG is 80 mg/dL at night he will eat crackers. Pt states he had a 14 day trial of a CGM and would really like to have one and believe it would help to catch his LBG during the night. Pt states after his wife had heart surgery they all changed their diet. Pt states he is trying to keep carbs to 15 g per day. Pt states 1/2 hr - 45 min after a meal with carbs his blood sugar will be 160-200 mg/dL. Pt states after a meals with minimal carbs will be 100-125 mg/dL and when it is this low he doesn't take metformin. RD educated patient on action of metformin.  Diabetes Self-Management Education - 09/14/17 0924      Visit Information   Visit Type  First/Initial      Initial Visit   Diabetes Type  Type 2    Are you currently following a meal plan?  Yes    What type of meal plan do you follow?  low carb    Are you taking your medications as prescribed?  No when BG is less than 120 at night doesn't take metformin    Date Diagnosed  Sept 2018      Health Coping   How would you rate your overall health?  Good      Psychosocial Assessment   How often do you need to have someone help you when you read instructions, pamphlets, or other written materials from your doctor or pharmacy?  1 - Never    What is the last grade level you completed in school?  college degree      Complications   Last HgB  A1C per patient/outside source  6.8 %    Postprandial Blood glucose range (mg/dL)  295-621;30-865;[id number];70-129;180-200    Number of hypoglycemic episodes per month  0    Have you had a dilated eye exam in the past 12 months?  Yes    Have you had a dental exam in the past 12 months?  Yes    Are you checking your feet?  No      Dietary Intake   Breakfast  cherrios, unsweetened almond milk, OR egg whites, ham, cheese OR Jimmy dean meals meat lovers     Lunch  hamburger, salad, OR lettuce with honey mustard dressing OR homemade soup    Dinner  meat, frozen vegetables    Snack (evening)  low carb ice cream      Exercise   Exercise Type  Light (walking / raking leaves)    How many days per week to you exercise?  3    How many minutes per day do you exercise?  20    Total minutes per week of exercise  60      Patient Education   Previous Diabetes Education  No  Nutrition management   Role of diet in the treatment of diabetes and the relationship between the three main macronutrients and blood glucose level    Monitoring  Identified appropriate SMBG and/or A1C goals.    Acute complications  Taught treatment of hypoglycemia - the 15 rule.      Individualized Goals (developed by patient)   Nutrition  General guidelines for healthy choices and portions discussed    Medications  take my medication as prescribed    Monitoring   test my blood glucose as discussed      Outcomes   Expected Outcomes  Demonstrated interest in learning. Expect positive outcomes    Future DMSE  4-6 wks    Program Status  Not Completed     Individualized Plan for Diabetes Self-Management Training:   Learning Objective:  Patient will have a greater understanding of diabetes self-management. Patient education plan is to attend individual and/or group sessions per assessed needs and concerns.   Patient Instructions  Consider checking your blood sugar before eating breakfast (fasting) and while you are adding more carbs into  your diet, check 2 hrs after meals to see how you are doing with it. Try increasing carbs to 45-60 per meal, 15-30 grams for snacks. Be sure to include protein with meals and snacks. If you notice consistent lower blood sugar numbers contact your doctor to review your medications. Consider adding protein to your breakfast cereal. You might try Fairlife milk, lactose free and higher in protein. Oatmeal and walnuts are a great thing to include as well. Take your multivitamin daily Take your diabetes medications regardless of your blood sugar readings   Expected Outcomes:  Demonstrated interest in learning. Expect positive outcomes  Education material provided: ADA Diabetes: Your Take Control Guide, A1C conversion sheet and My Plate  If problems or questions, patient to contact team via:  Phone  Future DSME appointment: 4-6 wks

## 2017-09-20 DIAGNOSIS — E1165 Type 2 diabetes mellitus with hyperglycemia: Secondary | ICD-10-CM | POA: Insufficient documentation

## 2017-09-20 HISTORY — DX: Type 2 diabetes mellitus with hyperglycemia: E11.65

## 2017-10-11 ENCOUNTER — Encounter: Payer: Medicare Other | Attending: Internal Medicine | Admitting: Registered"

## 2017-10-11 DIAGNOSIS — E11649 Type 2 diabetes mellitus with hypoglycemia without coma: Secondary | ICD-10-CM | POA: Insufficient documentation

## 2017-10-11 DIAGNOSIS — Z713 Dietary counseling and surveillance: Secondary | ICD-10-CM | POA: Insufficient documentation

## 2017-10-11 DIAGNOSIS — E1165 Type 2 diabetes mellitus with hyperglycemia: Secondary | ICD-10-CM

## 2017-10-11 NOTE — Patient Instructions (Addendum)
When your blood sugar goes above 180 write down what you eat to find patterns When you eat fruit, be sure to also eat protein Exercise is a great way to help your blood sugar, pay attention to how you feel and stop if feeling bad. Consider checking your blood sugar regularly for a couple of weeks and when you see it stays in target (after meals 180 or less, 80-130 fasting), you can start checking just once a week. If you start having low blood sugar often contact your doctor and you may need medication adjustment

## 2017-10-11 NOTE — Progress Notes (Signed)
Diabetes Self-Management Education  Visit Type: Follow-up  Appt. Start Time: 0840 Appt. End Time: 0915  10/12/2017  [name], identified by name and date of birth, is a 77 y.o. male with a diagnosis of Diabetes:  .   ASSESSMENT Pt states he is allowing himself to have 30 g carbs at meals and states he is feeling better, has more energy. Pt repeated how he is afraid of low blood sugar and is careful. Pt denies low blood sugar symptoms since last visit.  Patient states he does not get much activity but is interested in making an effort to get in more movement.  Patient states he is interested in CGM because he doesn't like pricking his finger. with an A1c of 6.8% RD does not think his insurance would pay for it. RD instructed patient to continuing checking to find patterns and what affects his BG, positive and negative, then when he finds what works well for him can cut back the number of times he checks BG. Patient's son provides help managing DM and downloaded app for glucometer during visit so we can have his data in graphs and averages at his next appointment.  Diabetes Self-Management Education - 10/12/17 1652      Visit Information   Visit Type  Follow-up      Psychosocial Assessment   Other persons present  Family Member      Complications   How often do you check your blood sugar?  1-2 times/day    Fasting Blood glucose range (mg/dL)  [phone]    Postprandial Blood glucose range (mg/dL)  045-409;811-914180-200;130-179   mostly within target, just a few in 190s   Number of hypoglycemic episodes per month  0      Exercise   Exercise Type  Light (walking / raking leaves)    How many days per week to you exercise?  4    How many minutes per day do you exercise?  20    Total minutes per week of exercise  80      Patient Education   Nutrition management   Role of diet in the treatment of diabetes and the relationship between the three main macronutrients and blood glucose level    Physical activity and exercise   Role of exercise on diabetes management, blood pressure control and cardiac health.    Monitoring  Identified appropriate SMBG and/or A1C goals.    Acute complications  Taught treatment of hypoglycemia - the 15 rule.      Individualized Goals (developed by patient)   Nutrition  General guidelines for healthy choices and portions discussed    Physical Activity  Exercise 3-5 times per week    Monitoring   test my blood glucose as discussed      Outcomes   Expected Outcomes  Demonstrated interest in learning. Expect positive outcomes    Future DMSE  2 months    Program Status  Not Completed      Subsequent Visit   Since your last visit have you continued or begun to take your medications as prescribed?  Yes    Since your last visit have you experienced any weight changes?  Loss    Weight Loss (lbs)  2    Since your last visit, are you checking your blood glucose at least once a day?  Yes     Individualized Plan for Diabetes Self-Management Training:   Learning Objective:  Patient will have a greater understanding of diabetes  self-management. Patient education plan is to attend individual and/or group sessions per assessed needs and concerns.  Patient Instructions  When your blood sugar goes above 180 write down what you eat to find patterns When you eat fruit, be sure to also eat protein Exercise is a great way to help your blood sugar, pay attention to how you feel and stop if feeling bad. Consider checking your blood sugar regularly for a couple of weeks and when you see it stays in target (after meals 180 or less, 80-130 fasting), you can start checking just once a week. If you start having low blood sugar often contact your doctor and you may need medication adjustment  Expected Outcomes:  Demonstrated interest in learning. Expect positive outcomes  Education material provided: glucometer app instructions  If problems or questions, patient to  contact team via:  Phone  Future DSME appointment: 2 months

## 2017-11-15 ENCOUNTER — Encounter: Payer: Medicare Other | Attending: Internal Medicine | Admitting: Registered"

## 2017-11-15 DIAGNOSIS — E1165 Type 2 diabetes mellitus with hyperglycemia: Secondary | ICD-10-CM

## 2017-11-15 DIAGNOSIS — E11649 Type 2 diabetes mellitus with hypoglycemia without coma: Secondary | ICD-10-CM | POA: Diagnosis not present

## 2017-11-15 DIAGNOSIS — Z713 Dietary counseling and surveillance: Secondary | ICD-10-CM | POA: Diagnosis not present

## 2017-11-15 NOTE — Patient Instructions (Addendum)
When checking blood sugar mark the reading if it is before or after eating. Check your blood sugar in the morning before you eat. About 2 hours after meals. If you see numbers above 200 consider writing down what you eat. Consider getting more exercise. If you don't walk your dog, consider doing the arm chair exercises or going to the gym with your son.

## 2017-11-15 NOTE — Progress Notes (Signed)
Diabetes Self-Management Education  Visit Type: Follow-up  Appt. Start Time: 0800 Appt. End Time: 0835  11/15/2017  [name], identified by name and date of birth, is a 77 y.o. male with a diagnosis of Diabetes:  .   This patient is accompanied in the office by his son.  ASSESSMENT Patient states for the last 3 weeks he has not been taking Lorsartan or Januvia due to rash he developed and after reading all the potential side effects is leery about taking the medications. Pt states he has appointment with Dr. Nehemiah Morgan today and will discuss alternative medications. Patient's son brought print out of patients BG readings over the last 60 days. Pt states he is not marking his readings as fasting or after meals. Pt states he takes his first BG right after eating breakfast. Over the last 2 weeks these numbers are usually within FBG range. Pt states he will check PPBG anywhere from 30 min to 90 min after eating. Last to weeks RD noted 2 readings above 250 mg/dL and 4-0JW7-9th (weekend) quite a few readings above 200. Pt states he likes ice cream and may have been eating the regular instead of the low carb ice cream. Pt states he is afraid of low blood sugar and feels more secure when BG is higher rather than lower. RD noted lowest BG in graph was 80 mg/dL  Last visit patient reported decreased energy and had decreased his carb intake quite a bit. Pt states he has increased carbs a little and has helped increase energy. Pt's son stated concern that patient is taking more naps now.  Pt states he is not exercising. Patient states he enjoys taking his dog for a walk in the park. Pt son states that he doesn't want his dad to go to the gym alone due to concern of falling related to past low blood sugar event when physically active.  Diabetes Self-Management Education - 11/15/17 0824      Visit Information   Visit Type  Follow-up      Dietary Intake   Breakfast  egg whites, cheese OR cold cereal     Lunch  salad, ckn    Dinner  meatloaf, mixed veggies, small amount of potatoes    Snack (evening)  ice cream, berries    Beverage(s)  coffee, water      Exercise   Exercise Type  ADL's      Patient Education   Nutrition management   Reviewed blood glucose goals for pre and post meals and how to evaluate the patients' food intake on their blood glucose level.    Physical activity and exercise   Role of exercise on diabetes management, blood pressure control and cardiac health.    Monitoring  Purpose and frequency of SMBG.      Individualized Goals (developed by patient)   Nutrition  General guidelines for healthy choices and portions discussed    Physical Activity  --   increase movement   Medications  Other (comment)   discuss medication concerns with MD   Monitoring   test my blood glucose as discussed      Outcomes   Expected Outcomes  Demonstrated interest in learning. Expect positive outcomes    Future DMSE  Other (comment)   1 month after medication change   Program Status  Not Completed      Subsequent Visit   Since your last visit have you continued or begun to take your medications as prescribed?  No   per Pt, stopped taking Januvia (and Lorsartan) 3 weeks ago because he developed a rash   Since your last visit, are you checking your blood glucose at least once a day?  Yes      Individualized Plan for Diabetes Self-Management Training:   Learning Objective:  Patient will have a greater understanding of diabetes self-management. Patient education plan is to attend individual and/or group sessions per assessed needs and concerns.   Patient Instructions  When checking blood sugar mark the reading if it is before or after eating. Check your blood sugar in the morning before you eat. About 2 hours after meals. If you see numbers above 200 consider writing down what you eat. Consider getting more exercise. If you don't walk your dog, consider doing the arm chair  exercises or going to the gym with your son.   Expected Outcomes:  Demonstrated interest in learning. Expect positive outcomes  Education material provided: none  If problems or questions, patient to contact team via:  Phone  Future DSME appointment: Other (comment)(1 month after medication change)

## 2018-11-08 ENCOUNTER — Other Ambulatory Visit: Payer: Self-pay | Admitting: Otolaryngology

## 2019-01-07 ENCOUNTER — Other Ambulatory Visit (HOSPITAL_COMMUNITY)
Admission: RE | Admit: 2019-01-07 | Discharge: 2019-01-07 | Disposition: A | Discharge status: Home / Self Care | Payer: Medicare Other | Source: Ambulatory Visit | Attending: Otolaryngology | Admitting: Otolaryngology

## 2019-01-07 DIAGNOSIS — Z01812 Encounter for preprocedural laboratory examination: Secondary | ICD-10-CM | POA: Insufficient documentation

## 2019-01-07 DIAGNOSIS — Z20828 Contact with and (suspected) exposure to other viral communicable diseases: Secondary | ICD-10-CM | POA: Insufficient documentation

## 2019-01-08 LAB — NOVEL CORONAVIRUS, NAA (HOSP ORDER, SEND-OUT TO REF LAB; TAT 18-24 HRS): SARS-CoV-2, NAA: NOT DETECTED

## 2019-01-10 ENCOUNTER — Other Ambulatory Visit: Payer: Self-pay

## 2019-01-10 ENCOUNTER — Encounter (HOSPITAL_COMMUNITY): Payer: Self-pay | Admitting: *Deleted

## 2019-01-10 NOTE — Anesthesia Preprocedure Evaluation (Addendum)
Anesthesia Evaluation  Patient identified by MRN, date of birth, ID band Patient awake    Reviewed: Allergy & Precautions, NPO status , Patient's Chart, lab work & pertinent test results  Airway Mallampati: III  TM Distance: >3 FB Neck ROM: Full    Dental no notable dental hx.    Pulmonary sleep apnea and Continuous Positive Airway Pressure Ventilation , former smoker,    Pulmonary exam normal breath sounds clear to auscultation       Cardiovascular hypertension, Pt. on medications + Valvular Problems/Murmurs AS  Rhythm:Regular Rate:Normal + Systolic murmurs ECG: rate 74. Normal sinus rhythm Incomplete right bundle branch block  ECHO: Left ventricle: The cavity size was normal. Wall thickness was increased in a pattern of mild LVH. Systolic function was normal. The estimated ejection fraction was in the range of 60% to 65%. Wall motion was normal; there were no regional wall motion abnormalities. Features are consistent with a pseudonormal left ventricular filling pattern, with concomitant abnormal relaxation and increased filling pressure (grade 2 diastolic dysfunction). Aortic valve: Mildly to moderately calcified annulus. Moderately thickened, moderately calcified leaflets. There was mild to moderate stenosis   Neuro/Psych  Headaches, negative psych ROS   GI/Hepatic negative GI ROS, Neg liver ROS,   Endo/Other  diabetes, Oral Hypoglycemic Agents  Renal/GU negative Renal ROS     Musculoskeletal negative musculoskeletal ROS (+)   Abdominal (+) + obese,   Peds  Hematology HLD   Anesthesia Other Findings DEVIATED SEPTUM, BILATERAL TURBINATE HYPERTROPHY  Reproductive/Obstetrics                           Anesthesia Physical Anesthesia Plan  ASA: III  Anesthesia Plan: General   Post-op Pain Management:    Induction: Intravenous  PONV Risk Score and Plan: 2 and Ondansetron, Dexamethasone  and Treatment may vary due to age or medical condition  Airway Management Planned: Oral ETT  Additional Equipment:   Intra-op Plan:   Post-operative Plan: Extubation in OR  Informed Consent: I have reviewed the patients History and Physical, chart, labs and discussed the procedure including the risks, benefits and alternatives for the proposed anesthesia with the patient or authorized representative who has indicated his/her understanding and acceptance.     Dental advisory given  Plan Discussed with: CRNA  Anesthesia Plan Comments: (Mild AS by echo 2019, mean gradient 69mmHg. This is followed by PCP Dr. Delfina Redwood - per note when echo was ordered, pt was noted to have systolic murmur but was otherwise asymptomatic.  A1c 6.8 on 11/21/18. PCP office had no EKG on file, will need DOS as well as DOS labs and eval.   TTE 05/17/17: - Left ventricle: The cavity size was normal. Wall thickness was   increased in a pattern of mild LVH. Systolic function was normal.   The estimated ejection fraction was in the range of 60% to 65%.   Wall motion was normal; there were no regional wall motion   abnormalities. Features are consistent with a pseudonormal left   ventricular filling pattern, with concomitant abnormal relaxation   and increased filling pressure (grade 2 diastolic dysfunction). - Aortic valve: Mildly to moderately calcified annulus. Moderately   thickened, moderately calcified leaflets. There was mild to   moderate stenosis.)     Anesthesia Quick Evaluation

## 2019-01-10 NOTE — Progress Notes (Signed)
Spoke with pt's son, Carl Butner for pt's pre-op call. DPR on file. He states pt does not have a cardiac history. Pt is a type 2 diabetic. Erskine does not know what pt's last A1C was and when it was. He states pt does check his blood sugar at home and it usually runs between 80-180. Pt is on Metformin only. Instructed son to have pt check his blood sugar in the AM when he gets up. If blood sugar is 70 or below, treat with 1/2 cup of clear juice (apple or cranberry) and recheck blood sugar 15 minutes after drinking juice. Philopateer voiced understanding.  Kanen states his father can get confused and he needs to be with him at the beginning to answer any questions and he understands then that he will wait in the waiting area.  Pt had his Covid test on 01/07/19 and it is negative. He states pt has been in quarantine since the test was done.  I have called Dr. Lina Sar office and requested pt's most recent A1C and EKG if it's been within the past year.

## 2019-01-10 NOTE — Progress Notes (Signed)
Anesthesia Chart Review: Same day workup  Mild AS by echo 2019, mean gradient 63mmHg. This is followed by PCP Dr. Delfina Redwood - per note when echo was ordered, pt was noted to have systolic murmur but was otherwise asymptomatic.  A1c 6.8 on 11/21/18. PCP office had no EKG on file, will need DOS as well as DOS labs and eval.   TTE 05/17/17: - Left ventricle: The cavity size was normal. Wall thickness was   increased in a pattern of mild LVH. Systolic function was normal.   The estimated ejection fraction was in the range of 60% to 65%.   Wall motion was normal; there were no regional wall motion   abnormalities. Features are consistent with a pseudonormal left   ventricular filling pattern, with concomitant abnormal relaxation   and increased filling pressure (grade 2 diastolic dysfunction). - Aortic valve: Mildly to moderately calcified annulus. Moderately   thickened, moderately calcified leaflets. There was mild to   moderate stenosis.   Wynonia Musty [facility]/Anesthesiology Phone [phone] 01/10/2019 4:34 PM

## 2019-01-11 ENCOUNTER — Ambulatory Visit (HOSPITAL_COMMUNITY): Payer: Medicare Other | Admitting: Physician Assistant

## 2019-01-11 ENCOUNTER — Ambulatory Visit (HOSPITAL_COMMUNITY)
Admission: RE | Admit: 2019-01-11 | Discharge: 2019-01-11 | Disposition: A | Discharge status: Home / Self Care | Payer: Medicare Other | Attending: Otolaryngology | Admitting: Otolaryngology

## 2019-01-11 ENCOUNTER — Encounter (HOSPITAL_COMMUNITY): Payer: Self-pay | Admitting: *Deleted

## 2019-01-11 ENCOUNTER — Encounter (HOSPITAL_COMMUNITY)
Admission: RE | Disposition: A | Discharge status: Home / Self Care | Payer: Self-pay | Source: Home / Self Care | Attending: Otolaryngology

## 2019-01-11 DIAGNOSIS — J342 Deviated nasal septum: Secondary | ICD-10-CM | POA: Insufficient documentation

## 2019-01-11 DIAGNOSIS — G4730 Sleep apnea, unspecified: Secondary | ICD-10-CM | POA: Diagnosis not present

## 2019-01-11 DIAGNOSIS — J343 Hypertrophy of nasal turbinates: Secondary | ICD-10-CM | POA: Insufficient documentation

## 2019-01-11 DIAGNOSIS — J3489 Other specified disorders of nose and nasal sinuses: Secondary | ICD-10-CM | POA: Insufficient documentation

## 2019-01-11 DIAGNOSIS — Z87891 Personal history of nicotine dependence: Secondary | ICD-10-CM | POA: Insufficient documentation

## 2019-01-11 DIAGNOSIS — I10 Essential (primary) hypertension: Secondary | ICD-10-CM | POA: Insufficient documentation

## 2019-01-11 DIAGNOSIS — E119 Type 2 diabetes mellitus without complications: Secondary | ICD-10-CM | POA: Diagnosis not present

## 2019-01-11 HISTORY — PX: TURBINATE REDUCTION: SHX6157

## 2019-01-11 HISTORY — DX: Other complications of anesthesia, initial encounter: T88.59XA

## 2019-01-11 HISTORY — PX: NASAL SEPTOPLASTY W/ TURBINOPLASTY: SHX2070

## 2019-01-11 LAB — GLUCOSE, CAPILLARY
Glucose-Capillary: 117 mg/dL — ABNORMAL HIGH (ref 70–99)
Glucose-Capillary: 149 mg/dL — ABNORMAL HIGH (ref 70–99)

## 2019-01-11 LAB — CBC
HCT: 37.9 % — ABNORMAL LOW (ref 39.0–52.0)
Hemoglobin: 12.3 g/dL — ABNORMAL LOW (ref 13.0–17.0)
MCH: 30.6 pg (ref 26.0–34.0)
MCHC: 32.5 g/dL (ref 30.0–36.0)
MCV: 94.3 fL (ref 80.0–100.0)
Platelets: 257 10*3/uL (ref 150–400)
RBC: 4.02 MIL/uL — ABNORMAL LOW (ref 4.22–5.81)
RDW: 13.4 % (ref 11.5–15.5)
WBC: 7.2 10*3/uL (ref 4.0–10.5)
nRBC: 0 % (ref 0.0–0.2)

## 2019-01-11 LAB — BASIC METABOLIC PANEL
Anion gap: 9 (ref 5–15)
BUN: 23 mg/dL (ref 8–23)
CO2: 24 mmol/L (ref 22–32)
Calcium: 9.2 mg/dL (ref 8.9–10.3)
Chloride: 106 mmol/L (ref 98–111)
Creatinine, Ser: 1.06 mg/dL (ref 0.61–1.24)
GFR calc Af Amer: >60 mL/min (ref >60)
GFR calc non Af Amer: >60 mL/min (ref >60)
Glucose, Bld: 118 mg/dL — ABNORMAL HIGH (ref 70–99)
Potassium: 4 mmol/L (ref 3.5–5.1)
Sodium: 139 mmol/L (ref 135–145)

## 2019-01-11 LAB — HEMOGLOBIN A1C
Hgb A1c MFr Bld: 6.6 % — ABNORMAL HIGH (ref 4.8–5.6)
Mean Plasma Glucose: 142.72 mg/dL

## 2019-01-11 SURGERY — SEPTOPLASTY, NOSE, WITH NASAL TURBINATE REDUCTION
Anesthesia: General | Site: Nose | Laterality: Bilateral

## 2019-01-11 MED ORDER — LACTATED RINGERS IV SOLN
INTRAVENOUS | Status: DC
  Administered 2019-01-11: 08:00:00 via INTRAVENOUS

## 2019-01-11 MED ORDER — ESMOLOL HCL 100 MG/10ML IV SOLN
INTRAVENOUS | Status: DC | PRN
  Administered 2019-01-11: 20 mg via INTRAVENOUS

## 2019-01-11 MED ORDER — SUGAMMADEX SODIUM 200 MG/2ML IV SOLN
INTRAVENOUS | Status: DC | PRN
  Administered 2019-01-11: 400 mg via INTRAVENOUS
  Administered 2019-01-11: 200 mg via INTRAVENOUS

## 2019-01-11 MED ORDER — OXYMETAZOLINE HCL 0.05 % NA SOLN
NASAL | Status: AC
  Filled 2019-01-11: qty 30

## 2019-01-11 MED ORDER — FENTANYL CITRATE (PF) 250 MCG/5ML IJ SOLN
INTRAMUSCULAR | Status: AC
  Filled 2019-01-11: qty 5

## 2019-01-11 MED ORDER — ONDANSETRON HCL 4 MG/2ML IJ SOLN
INTRAMUSCULAR | Status: AC
  Filled 2019-01-11: qty 2

## 2019-01-11 MED ORDER — ONDANSETRON HCL 4 MG/2ML IJ SOLN
INTRAMUSCULAR | Status: DC | PRN
  Administered 2019-01-11: 4 mg via INTRAVENOUS

## 2019-01-11 MED ORDER — ONDANSETRON HCL 4 MG/2ML IJ SOLN: 4.0000 mg | Freq: Once | INTRAMUSCULAR | Status: DC | PRN

## 2019-01-11 MED ORDER — OXYMETAZOLINE HCL 0.05 % NA SOLN
NASAL | Status: DC | PRN
  Administered 2019-01-11: 1

## 2019-01-11 MED ORDER — BACITRACIN ZINC 500 UNIT/GM EX OINT
TOPICAL_OINTMENT | CUTANEOUS | Status: AC
  Filled 2019-01-11: qty 28.35

## 2019-01-11 MED ORDER — 0.9 % SODIUM CHLORIDE (POUR BTL) OPTIME
TOPICAL | Status: DC | PRN
  Administered 2019-01-11: 09:00:00 1000 mL

## 2019-01-11 MED ORDER — LIDOCAINE-EPINEPHRINE 1 %-1:100000 IJ SOLN
INTRAMUSCULAR | Status: AC
  Filled 2019-01-11: qty 1

## 2019-01-11 MED ORDER — ACETAMINOPHEN 500 MG PO TABS
1000.0000 mg | ORAL_TABLET | Freq: Once | ORAL | Status: AC
  Administered 2019-01-11: 1000 mg via ORAL
  Filled 2019-01-11: qty 2

## 2019-01-11 MED ORDER — FENTANYL CITRATE (PF) 100 MCG/2ML IJ SOLN: 25.0000 ug | INTRAMUSCULAR | Status: DC | PRN

## 2019-01-11 MED ORDER — DEXAMETHASONE SODIUM PHOSPHATE 10 MG/ML IJ SOLN
INTRAMUSCULAR | Status: DC | PRN
  Administered 2019-01-11: 4 mg via INTRAVENOUS

## 2019-01-11 MED ORDER — PHENYLEPHRINE HCL-NACL 10-0.9 MG/250ML-% IV SOLN
INTRAVENOUS | Status: AC
  Filled 2019-01-11: qty 250

## 2019-01-11 MED ORDER — DEXAMETHASONE SODIUM PHOSPHATE 10 MG/ML IJ SOLN
INTRAMUSCULAR | Status: AC
  Filled 2019-01-11: qty 1

## 2019-01-11 MED ORDER — BACITRACIN ZINC 500 UNIT/GM EX OINT
TOPICAL_OINTMENT | CUTANEOUS | Status: DC | PRN
  Administered 2019-01-11: 1 via TOPICAL

## 2019-01-11 MED ORDER — LIDOCAINE 2% (20 MG/ML) 5 ML SYRINGE
INTRAMUSCULAR | Status: DC | PRN
  Administered 2019-01-11: 100 mg via INTRAVENOUS

## 2019-01-11 MED ORDER — ROCURONIUM BROMIDE 10 MG/ML (PF) SYRINGE
PREFILLED_SYRINGE | INTRAVENOUS | Status: DC | PRN
  Administered 2019-01-11: 80 mg via INTRAVENOUS

## 2019-01-11 MED ORDER — OXYCODONE-ACETAMINOPHEN 5-325 MG PO TABS: 1.0000 | ORAL_TABLET | ORAL | 0 refills | Status: AC | PRN

## 2019-01-11 MED ORDER — LIDOCAINE-EPINEPHRINE 1 %-1:100000 IJ SOLN
INTRAMUSCULAR | Status: DC | PRN
  Administered 2019-01-11: 3 mL

## 2019-01-11 MED ORDER — FENTANYL CITRATE (PF) 250 MCG/5ML IJ SOLN
INTRAMUSCULAR | Status: DC | PRN
  Administered 2019-01-11: 25 ug via INTRAVENOUS
  Administered 2019-01-11 (×2): 50 ug via INTRAVENOUS

## 2019-01-11 MED ORDER — PROPOFOL 10 MG/ML IV BOLUS
INTRAVENOUS | Status: DC | PRN
  Administered 2019-01-11: 160 mg via INTRAVENOUS

## 2019-01-11 MED ORDER — LIDOCAINE 2% (20 MG/ML) 5 ML SYRINGE
INTRAMUSCULAR | Status: AC
  Filled 2019-01-11: qty 5

## 2019-01-11 MED ORDER — CLINDAMYCIN HCL 300 MG PO CAPS: 300.0000 mg | ORAL_CAPSULE | Freq: Three times a day (TID) | ORAL | 0 refills | Status: AC

## 2019-01-11 MED ORDER — PROPOFOL 10 MG/ML IV BOLUS
INTRAVENOUS | Status: AC
  Filled 2019-01-11: qty 20

## 2019-01-11 MED ORDER — ALBUTEROL SULFATE (2.5 MG/3ML) 0.083% IN NEBU
INHALATION_SOLUTION | RESPIRATORY_TRACT | Status: AC
  Filled 2019-01-11: qty 3

## 2019-01-11 MED ORDER — CLINDAMYCIN PHOSPHATE 900 MG/50ML IV SOLN
INTRAVENOUS | Status: DC | PRN
  Administered 2019-01-11: 900 mg via INTRAVENOUS

## 2019-01-11 MED ORDER — ROCURONIUM BROMIDE 10 MG/ML (PF) SYRINGE
PREFILLED_SYRINGE | INTRAVENOUS | Status: AC
  Filled 2019-01-11: qty 10

## 2019-01-11 SURGICAL SUPPLY — 33 items
CANISTER SUCT 3000ML PPV (MISCELLANEOUS) ×3 IMPLANT
COAGULATOR SUCT 6 FR SWTCH (ELECTROSURGICAL) ×1
COAGULATOR SUCT SWTCH 10FR 6 (ELECTROSURGICAL) ×2 IMPLANT
COVER SURGICAL LIGHT HANDLE (MISCELLANEOUS) ×2 IMPLANT
COVER WAND RF STERILE (DRAPES) ×3 IMPLANT
DRAPE HALF SHEET 40X57 (DRAPES) ×2 IMPLANT
ELECT REM PT RETURN 9FT ADLT (ELECTROSURGICAL) ×3
ELECTRODE REM PT RTRN 9FT ADLT (ELECTROSURGICAL) ×1 IMPLANT
GAUZE SPONGE 2X2 8PLY STRL LF (GAUZE/BANDAGES/DRESSINGS) ×1 IMPLANT
GLOVE BIO SURGEON STRL SZ7 (GLOVE) ×2 IMPLANT
GLOVE BIOGEL PI IND STRL 6.5 (GLOVE) IMPLANT
GLOVE BIOGEL PI INDICATOR 6.5 (GLOVE) ×2
GLOVE ECLIPSE 6.5 STRL STRAW (GLOVE) ×2 IMPLANT
GLOVE ECLIPSE 7.5 STRL STRAW (GLOVE) ×3 IMPLANT
GOWN STRL REUS W/ TWL LRG LVL3 (GOWN DISPOSABLE) ×2 IMPLANT
GOWN STRL REUS W/TWL LRG LVL3 (GOWN DISPOSABLE) ×6
KIT BASIN OR (CUSTOM PROCEDURE TRAY) ×3 IMPLANT
KIT TURNOVER KIT B (KITS) ×3 IMPLANT
NDL HYPO 25GX1X1/2 BEV (NEEDLE) ×1 IMPLANT
NEEDLE HYPO 25GX1X1/2 BEV (NEEDLE) ×3 IMPLANT
NS IRRIG 1000ML POUR BTL (IV SOLUTION) ×3 IMPLANT
PAD ARMBOARD 7.5X6 YLW CONV (MISCELLANEOUS) ×6 IMPLANT
SPLINT NASAL DOYLE BI-VL (GAUZE/BANDAGES/DRESSINGS) ×3 IMPLANT
SPONGE GAUZE 2X2 STER 10/PKG (GAUZE/BANDAGES/DRESSINGS) ×2
SPONGE NEURO XRAY DETECT 1X3 (DISPOSABLE) ×3 IMPLANT
SUT CHROMIC 3 0 SH 27 (SUTURE) ×3 IMPLANT
SUT CHROMIC 4 0 SH 27 (SUTURE) ×3 IMPLANT
SUT PLAIN 4 0 ~~LOC~~ 1 (SUTURE) ×3 IMPLANT
SUT PROLENE 2 0 FS (SUTURE) ×3 IMPLANT
SUT PROLENE 3 0 PS 2 (SUTURE) ×3 IMPLANT
TRAY ENT MC OR (CUSTOM PROCEDURE TRAY) ×3 IMPLANT
TUBE SALEM SUMP 16 FR W/ARV (TUBING) IMPLANT
TUBING EXTENTION W/L.L. (IV SETS) IMPLANT

## 2019-01-11 NOTE — Op Note (Signed)
DATE OF PROCEDURE: 01/11/2019  OPERATIVE REPORT   SURGEON: Leta Baptist, MD   PREOPERATIVE DIAGNOSES:  1. Nasal septal deviation.  2. Bilateral inferior turbinate hypertrophy.  3. Chronic nasal obstruction.  POSTOPERATIVE DIAGNOSES:  1. Nasal septal deviation.  2. Bilateral inferior turbinate hypertrophy.  3. Chronic nasal obstruction.  PROCEDURE PERFORMED:  1. Septoplasty.  2. Bilateral partial inferior turbinate resection.   ANESTHESIA: General endotracheal tube anesthesia.   COMPLICATIONS: None.   ESTIMATED BLOOD LOSS: 50 mL.   INDICATION FOR PROCEDURE: Christian Morgan is a 78 y.o. male with a history of chronic nasal obstruction. The patient was  treated with antihistamine, decongestant, and steroid nasal spray. However, the patient continued to be symptomatic. On examination, the patient was noted to have bilateral severe inferior turbinate hypertrophy and nasal septal deviation/spur, causing significant nasal obstruction. Based on the above findings, the decision was made for the patient to undergo the above-stated procedures. The risks, benefits, alternatives, and details of the procedures were discussed with the patient. Questions were invited and answered. Informed consent was obtained.   DESCRIPTION OF PROCEDURE: The patient was taken to the operating room and placed supine on the operating table. General endotracheal tube anesthesia was administered by the anesthesiologist. The patient was positioned, and prepped and draped in the standard fashion for nasal surgery. Pledgets soaked with Afrin were placed in both nasal cavities for decongestion.  Examination of the nasal cavity revealed a nasal septal deviation and septal spur. 1% lidocaine with 1:100,000 epinephrine was injected onto the nasal septum bilaterally. A hemitransfixion incision was made on the left side. The mucosal flap was carefully elevated on the left side. A cartilaginous incision was made 1 cm superior to the  caudal margin of the nasal septum. Mucosal flap was also elevated on the right side in the similar fashion. It should be noted that due to the septal deviation, the deviated portion of the cartilaginous and bony septum had to be removed in piecemeal fashion. Once the deviated portions were removed, a straight midline septum was achieved. The septum was then quilted with 4-0 plain gut sutures. The hemitransfixion incision was closed with interrupted 4-0 chromic sutures.   The inferior one half of both hypertrophied inferior turbinate was crossclamped with a Kelly clamp. The inferior one half of each inferior turbinate was then resected with a pair of cross cutting scissors. Hemostasis was achieved with a suction cautery device.  Doyle splints were applied to the nasal septum.  The care of the patient was turned over to the anesthesiologist. The patient was awakened from anesthesia without difficulty. The patient was extubated and transferred to the recovery room in good condition.   OPERATIVE FINDINGS: Nasal septal deviation and bilateral inferior turbinate hypertrophy.   SPECIMEN: None.   FOLLOWUP CARE: The patient be discharged home once he is awake and alert.  The patient will follow up in my office in approximately 1 week for splint removal.   Christian Kinney Raynelle Bring, MD

## 2019-01-11 NOTE — Discharge Instructions (Addendum)
POSTOPERATIVE INSTRUCTIONS FOR PATIENTS HAVING NASAL OR SINUS OPERATIONS ACTIVITY: Restrict activity at home for the first two days, resting as much as possible. Light activity is best. You may usually return to work within a week. You should refrain from nose blowing, strenuous activity, or heavy lifting greater than 20lbs for a total of one week after your operation.  If sneezing cannot be avoided, sneeze with your mouth open. DISCOMFORT: You may experience a dull headache and pressure along with nasal congestion and discharge. These symptoms may be worse during the first week after the operation but may last as long as two to four weeks.  Please take Tylenol or the pain medication that has been prescribed for you. Do not take aspirin or aspirin containing medications since they may cause bleeding.  You may experience symptoms of post nasal drainage, nasal congestion, headaches and fatigue for two or three months after your operation.  BLEEDING: You may have some blood tinged nasal drainage for approximately two weeks after the operation.  The discharge will be worse for the first week.  Please call our office at [phone] or go to the nearest hospital emergency room if you experience any of the following: heavy, bright red blood from your nose or mouth that lasts longer than 15 minutes or coughing up or vomiting bright red blood or blood clots. GENERAL CONSIDERATIONS: 1. A gauze dressing will be placed on your upper lip to absorb any drainage after the operation. You may need to change this several times a day.  If you do not have very much drainage, you may remove the dressing.  Remember that you may gently wipe your nose with a tissue and sniff in, but DO NOT blow your nose. 2. Please keep all of your postoperative appointments.  Your final results after the operation will depend on proper follow-up.  The initial visit is usually 2 to 5 days after the operation.  During this visit, the remaining nasal  packing and internal septal splints will be removed.  Your nasal and sinus cavities will be cleaned.  During the second visit, your nasal and sinus cavities will be cleaned again. Have someone drive you to your first two postoperative appointments.  3. How you care for your nose after the operation will influence the results that you obtain.  You should follow all directions, take your medication as prescribed, and call our office [phone] with any problems or questions. 4. You may be more comfortable sleeping with your head elevated on two pillows. 5. Do not take any medications that we have not prescribed or recommended. WARNING SIGNS: if any of the following should occur, please call our office: 1. Persistent fever greater than 102F. 2. Persistent vomiting. 3. Severe and constant pain that is not relieved by prescribed pain medication. 4. Trauma to the nose. 5. Rash or unusual side effects from any medicines.  

## 2019-01-11 NOTE — Anesthesia Postprocedure Evaluation (Signed)
Anesthesia Post Note  Patient: Ciro Tashiro  Procedure(s) Performed: NASAL SEPTOPLASTY (Bilateral Nose) Turbinate Reduction (Bilateral Nose)     Patient location during evaluation: PACU Anesthesia Type: General Level of consciousness: awake Pain management: pain level controlled Vital Signs Assessment: post-procedure vital signs reviewed and stable Respiratory status: spontaneous breathing, nonlabored ventilation, respiratory function stable and patient connected to nasal cannula oxygen Cardiovascular status: blood pressure returned to baseline and stable Postop Assessment: no apparent nausea or vomiting Anesthetic complications: no    Last Vitals:  Vitals:   01/11/19 1015 01/11/19 1030  BP: (!) 149/68 (!) 146/68  Pulse: 93 91  Resp: 12 14  Temp:  (!) 36.1 C  SpO2: 96% 94%    Last Pain:  Vitals:   01/11/19 1030  TempSrc:   PainSc: 0-No pain                 Ryan P Ellender

## 2019-01-11 NOTE — Transfer of Care (Addendum)
Immediate Anesthesia Transfer of Care Note  Patient: Christian Morgan  Procedure(s) Performed: NASAL SEPTOPLASTY (Bilateral Nose) Turbinate Reduction (Bilateral Nose)  Patient Location: PACU  Anesthesia Type:General  Level of Consciousness: awake  Airway & Oxygen Therapy: Patient Spontanous Breathing and Patient connected to nasal cannula oxygen - Patient reports slight discomfort with breathing   Post-op Assessment: Patient VSS. Anesthesiologist notified of breathing status  Post vital signs: Reviewed and stable  Last Vitals:  Vitals Value Taken Time  BP 166/90 01/11/19 0930  Temp    Pulse 105 01/11/19 0937  Resp 16 01/11/19 0937  SpO2 94 % 01/11/19 0937  Vitals shown include unvalidated device data.  Last Pain:  Vitals:   01/11/19 0716  TempSrc: Oral  PainSc: 0-No pain      Patients Stated Pain Goal: 0 (77/82/42 [id number])  Complications: No apparent anesthesia complications

## 2019-01-11 NOTE — Anesthesia Procedure Notes (Signed)
Procedure Name: Intubation Performed by: Avan Gullett H, CRNA Pre-anesthesia Checklist: Patient identified, Emergency Drugs available, Suction available and Patient being monitored Patient Re-evaluated:Patient Re-evaluated prior to induction Oxygen Delivery Method: Circle System Utilized Preoxygenation: Pre-oxygenation with 100% oxygen Induction Type: IV induction Ventilation: Mask ventilation without difficulty Laryngoscope Size: Miller and 2 Grade View: Grade I Tube type: Oral Tube size: 7.0 mm Number of attempts: 1 Airway Equipment and Method: Stylet Placement Confirmation: ETT inserted through vocal cords under direct vision,  positive ETCO2 and breath sounds checked- equal and bilateral Secured at: 22 cm Tube secured with: Tape Dental Injury: Teeth and Oropharynx as per pre-operative assessment        

## 2019-01-11 NOTE — H&P (Signed)
Cc: Chronic nasal obstruction  HPI: The patient is a 78 y/o male who returns today with his son for follow up evaluation of chronic nasal obstruction. The patient was last seen 4 weeks ago. At that time, he was noted to have a septal deviation, septal spur, and bilateral inferior turbinate hypertrophy. The patient was placed on daily Flonase and encouraged to use periodic saline irrigation. The patient returns today noting minimal improvement in his nasal congestion. His symptoms are worse at night. He denies facial pain or pressure.    Exam: The flexible scope was inserted into the right nasal cavity.  NSD with spur. Endoscopy of the inferior and middle meatus was performed.  Edematous mucosa was noted.  No polyp, mass, or lesion was appreciated.  Olfactory cleft was clear.  Nasopharynx was clear.  Turbinates were hypertrophied but without mass.  Incomplete response to decongestion.  The procedure was repeated on the contralateral side with similar findings.  The patient tolerated the procedure well.  Instructions were given to avoid eating or drinking for 2 hours.    Assessment:  The patient is noted to have a right septal deviation with septal spur and bilateral inferior turbinate hypertrophy. More than 95% of his nasal passageways are obstructed. No purulent drainage, polyps, or other suspicious mass or lesion is noted on today's nasal endoscopy.  Plan:  1. Nasal endoscopy findings are reviewed with the patient.  2. Treatment options include conservative management with daily steroid nasal spray versus septoplasty and bilateral inferior turbinate reduction. The risks, benefits, alternatives, and details of the procedure are reviewed with the patient. Questions are invited and answered. 3. The patient would like to proceed with surgical intervention. The procedure will be scheduled at the [facility] secondary to his history of sleep apnea.

## 2019-01-12 ENCOUNTER — Encounter (HOSPITAL_COMMUNITY): Payer: Self-pay | Admitting: Otolaryngology

## 2019-01-23 ENCOUNTER — Encounter (HOSPITAL_COMMUNITY): Payer: Self-pay | Admitting: Otolaryngology

## 2019-07-13 ENCOUNTER — Encounter: Payer: Self-pay | Admitting: Neurology

## 2019-07-25 NOTE — Progress Notes (Signed)
Assessment/Plan:   1.  Parkinsonism, possible Parkinsons Disease although atypical state cannot be r/o given slight vertical gaze paresis  -We decided to add carbidopa/levodopa 25/100.  1/2 tab tid x 1 wk, then 1/2 in am & noon & 1 at night for a week, then 1/2 in am &1 at noon &night for a week, then 1 po tid.  Risks, benefits, side effects and alternative therapies were discussed.  The opportunity to ask questions was given and they were answered to the best of my ability.  The patient expressed understanding and willingness to follow the outlined treatment protocols.  -I will refer the patient [facility] for [facility].  We talked about the importance of safe, cardiovascular exercise in Parkinson's disease.  -We discussed community resources in the area including patient support groups and community exercise programs for PD and pt education was provided to the patient.  2.  B12 deficiency  -Would like to see the patient's B12 over 400.  His is 201.  We will add B12 supplement, 1000 mcg daily, OTC  3.  Memory loss  -Patient had fairly profound memory loss and was fairly repetitive during the visit.  Suspect that there is dementia present.  -We will schedule neurocognitive testing.  -Patient's son lives with patient and wife.  Son takes care of finances.  -Discussed safety.  -Discussed the importance of a regular daily schedule.  Discussed in detail what this means.  Patient sleeps a lot during the day, but also awakens at night and eats when the son comes home (patient's son works at night and comes home at strange hours).  Discussed that patient's memory is not going to respond well to this.   Subjective:   Christian Morgan was seen today in the movement disorders clinic for neurologic consultation at the request of Seward Carol, MD.  The consultation is for the evaluation of tremor and memory change.  This patient is accompanied in the  office by his son who supplements the history.   Specific Symptoms:  Tremor: Yes.  , R hand x 1 week but getting better.  Mostly at rest per son.  No family hx of tremor.  No new medications.  No R leg tremor Family hx of similar:  No. Voice: no change Sleep: no trouble sleeping but son states it is interrupted sleep b/c son works nights so patient often sleeps nights and will awaken in night with son to eat when son comes home  Vivid Dreams:  No.  Acting out dreams:  No. Wet Pillows: No. Postural symptoms:  No.  Falls?  No. Bradykinesia symptoms: shuffling gait and slow movements Loss of smell:  No. Loss of taste:  No. Urinary Incontinence:  No. Difficulty Swallowing:  No. Handwriting, micrographia: No. Trouble with ADL's:  No.  Trouble buttoning clothing: No. Depression:  No. Memory changes:  Yes.   Living situation:  Pt lives with their family (pt and wife and son lives with them) The patient does not do the finances in the home.  son does finances - wife used to but she had trouble so he took it over.  The patient does drive.  No issues per pt and son There have not been any motor vehicle accidents in the recent years.  The patient does not cook much and never has.    Pt is having some trouble remembering meds- son calls them to remind them.    Behavior:  There have been no behavioral changes over the years.    Hallucinations:  No.  visual distortions: No. N/V:  No. Lightheaded:  No.  Syncope: No. Diplopia:  No. Dyskinesia:  No. Prior exposure to reglan/antipsychotics: No.   Last neuroimaging was in 2018, CT head.  This was unremarkable.  I personally reviewed it.  PREVIOUS MEDICATIONS: none to date  ALLERGIES:   Allergies  Allergen Reactions  . Lipitor [Atorvastatin] Other (See Comments)    Increased liver enzymes and joint pain from numerous statins  . Penicillins Hives and Rash    Did it involve swelling of the face/tongue/throat, SOB, or low BP? No Did it  involve sudden or severe rash/hives, skin peeling, or any reaction on the inside of your mouth or nose? No Did you need to seek medical attention at a hospital or doctor's office? yes When did it last happen? 30 years If all above answers are "NO", may proceed with cephalosporin use.  . Amitriptyline Hcl     Unknown reaction  . Cyclobenzaprine     Burning in stomach  . Gabapentin     Dizziness   . Other Other (See Comments)    [place]: "Terrible migraines."  . Zetia [Ezetimibe]     Joint pain  . Aspirin Other (See Comments)    Upset stomach  . Doxycycline Hives    Reaction took 4-5 days  . Iodinated Diagnostic Agents Hives    Developed hives after contrast "50 years ago when I had a subdural hematoma" at age 79.  Tolerates now with Benadryl 50mg  PO one hour before contrast.  . Levofloxacin Hives    Delayed allergic reaction    CURRENT MEDICATIONS:  Current Outpatient Medications  Medication Instructions  . acetaminophen (TYLENOL) 500 mg, Oral, Every 6 hours PRN  . escitalopram (LEXAPRO) 5 mg, Oral, Daily  . levocetirizine (XYZAL) 5 mg, Oral, Every evening  . Light Mineral Oil-Mineral Oil (RETAINE MGD OP) 1 drop, Right Eye, Daily PRN  . loratadine (CLARITIN) 10 mg, Oral, Daily  . losartan (COZAAR) 100 mg, Oral, Daily  . metFORMIN (GLUCOPHAGE-XR) 1,000 mg, Oral, 2 times daily  . Multiple Vitamins-Minerals (MULTIVITAMIN ADULT PO) 1 tablet, Oral, Every M-W-F  . rosuvastatin (CRESTOR) 5 mg, Oral, Daily    Objective:   VITALS:   Vitals:   07/27/19 0838  BP: 124/62  Pulse: 72  SpO2: 96%  Weight: 222 lb (100.7 kg)  Height: 5\' 6"  (1.676 m)    GEN:  The patient appears stated age and is in NAD. HEENT:  Normocephalic, atraumatic.  The mucous membranes are moist. The superficial temporal arteries are without ropiness or tenderness. CV:  RRR with 3/6 sem Lungs:  CTAB Neck/HEME:  There are no carotid bruits bilaterally.  Neurological examination:  Orientation: The  patient is alert and oriented x2. (just had MMSE at PCP 2 weeks ago and scored 23/30).  However, he is very repetitive during the examination and history. Cranial nerves: There is good facial symmetry. There is facial hypomimia with lips parted.  Extraocular muscles are intact with the exception of some upgaze paresis and very mild downgaze. The visual fields are full to confrontational testing. The speech is fluent and clear. Soft palate rises symmetrically and there is no tongue deviation. Hearing is intact to conversational tone. Sensation: Sensation is intact to light and pinprick throughout (facial, trunk, extremities). Vibration is intact at the bilateral big ankle (decreased at the big toe). There is no extinction with double simultaneous stimulation. There is  no sensory dermatomal level identified. Motor: Strength is 5/5 in the bilateral upper and lower extremities.   Shoulder shrug is equal and symmetric.  There is no pronator drift. Deep tendon reflexes: Deep tendon reflexes are 2/4 at the bilateral biceps, triceps, brachioradialis, patella and absent at the bilateral Achilles . Plantar responses are downgoing bilaterally.  Movement examination: Tone: There is mild increased tone in the RUE.  Tone is normal in the LUE/LLE Abnormal movements: there is intermittent RUE rest tremor that increases with distraction Coordination:  There is decremation with RAM's, with any form of RAMS, including alternating supination and pronation of the forearm, hand opening and closing, finger taps, heel taps and toe taps on the right. Gait and Station: The patient has no difficulty arising out of a deep-seated chair without the use of the hands. The patient's stride length is decreased with RUE tremor.   I have reviewed and interpreted the following labs independently   Chemistry      Component Value Date/Time   NA 139 01/11/2019 0649   K 4.0 01/11/2019 0649   CL 106 01/11/2019 0649   CO2 24 01/11/2019 0649    BUN 23 01/11/2019 0649   CREATININE 1.06 01/11/2019 0649      Component Value Date/Time   CALCIUM 9.2 01/11/2019 0649      No results found for: TSH Lab Results  Component Value Date   WBC 7.2 01/11/2019   HGB 12.3 (L) 01/11/2019   HCT 37.9 (L) 01/11/2019   MCV 94.3 01/11/2019   PLT 257 01/11/2019   More recent labs were done at primary care office.  Labs are dated May 22, 2019.  Hemoglobin A1c was 6.7.  TSH was 3.36.  Sodium was 138, potassium 4.1, chloride 100, CO2 29, BUN 24, creatinine 1.12, glucose 101 B12 was done on Jul 10, 2019 and was 201.  Total time spent on today's visit was 60 minutes, including both face-to-face time and nonface-to-face time.  Time included that spent on review of records (prior notes available to me/labs/imaging if pertinent), discussing treatment and goals, answering patient's questions and coordinating care.  Cc:  [name], MD

## 2019-07-27 ENCOUNTER — Ambulatory Visit: Payer: Medicare Other | Admitting: Neurology

## 2019-07-27 ENCOUNTER — Encounter: Payer: Self-pay | Admitting: Neurology

## 2019-07-27 ENCOUNTER — Other Ambulatory Visit: Payer: Self-pay

## 2019-07-27 VITALS — BP 124/62 | HR 72 | Ht 66.0 in | Wt 222.0 lb

## 2019-07-27 DIAGNOSIS — R413 Other amnesia: Secondary | ICD-10-CM | POA: Diagnosis not present

## 2019-07-27 DIAGNOSIS — G20 Parkinson's disease: Secondary | ICD-10-CM

## 2019-07-27 MED ORDER — CARBIDOPA-LEVODOPA 25-100 MG PO TABS: 1.0000 | ORAL_TABLET | Freq: Three times a day (TID) | ORAL | 1 refills | Status: DC

## 2019-07-27 NOTE — Patient Instructions (Addendum)
1.  I want you to take B12 supplement daily  2.  Start Carbidopa Levodopa as follows:  Take 1/2 tablet three times daily, at least 30 minutes before meals (approximately 7am/11am/4pm), for one week  Then take 1/2 tablet in the morning, 1/2 tablet in the afternoon, 1 tablet in the evening, at least 30 minutes before meals, for one week  Then take 1/2 tablet in the morning, 1 tablet in the afternoon, 1 tablet in the evening, at least 30 minutes before meals, for one week  Then take 1 tablet three times daily at 7am/11am/4pm, at least 30 minutes before meals   As a reminder, carbidopa/levodopa can be taken at the same time as a carbohydrate, but we like to have you take your pill either 30 minutes before a protein source or 1 hour after as protein can interfere with carbidopa/levodopa absorption.  3.  You have been referred for a neurocognitive evaluation in our office.   The evaluation has two parts.   . The first part of the evaluation is a clinical interview with the neuropsychologist (Dr. Milbert Coulter or Dr. Roseanne Reno). Please bring someone with you to this appointment if possible, as it is helpful for the doctor to hear from both you and another adult who knows you well.   . The second part of the evaluation is testing with the doctor's technician Annabelle Harman or Selena Batten). The testing includes a variety of tasks- mostly question-and-answer, some paper-and-pencil. There is nothing you need to do to prepare for this appointment, but having a good night's sleep prior to the testing, taking medications as you normally would, and bringing eyeglasses and hearing aids (if you wear them), is advised. Please make sure that you wear a mask to the appointment.  Please note: We have to reserve several hours of the neuropsychologist's time and the psychometrician's time for your evaluation appointment. As such, please note that there is a No-Show fee of $100. If you are unable to attend any of your appointments, please  contact our office as soon as possible to reschedule.  4.  You have been [facility] for [facility]. They will call you directly to schedule an appointment.  Please call [phone] if you do not hear from them.

## 2019-08-01 ENCOUNTER — Other Ambulatory Visit: Payer: Self-pay

## 2019-08-01 ENCOUNTER — Encounter: Payer: Self-pay | Admitting: Psychology

## 2019-08-01 ENCOUNTER — Ambulatory Visit (INDEPENDENT_AMBULATORY_CARE_PROVIDER_SITE_OTHER): Payer: Medicare Other | Admitting: Psychology

## 2019-08-01 ENCOUNTER — Ambulatory Visit: Payer: Medicare Other | Admitting: Psychology

## 2019-08-01 DIAGNOSIS — R4189 Other symptoms and signs involving cognitive functions and awareness: Secondary | ICD-10-CM

## 2019-08-01 DIAGNOSIS — F0150 Vascular dementia without behavioral disturbance: Secondary | ICD-10-CM

## 2019-08-01 DIAGNOSIS — F0390 Unspecified dementia without behavioral disturbance: Secondary | ICD-10-CM

## 2019-08-01 NOTE — Progress Notes (Signed)
   Psychometrician Note   Cognitive testing was administered to [facility] by Wallace Keller, B.S. (psychometrist) under the supervision of Dr. Newman Nickels, Ph.D., licensed psychologist on 08/01/19. Mr. Deshon Hsiao did not appear overtly distressed by the testing session per behavioral observation or responses across self-report questionnaires. Dr. Newman Nickels, Ph.D. checked in with Mr. Gabryel Files as needed to manage any distress related to testing procedures (if applicable). Rest breaks were offered.    The battery of tests administered was selected by Dr. Newman Nickels, Ph.D. with consideration to [name]'s current level of functioning, the nature of his symptoms, emotional and behavioral responses during interview, level of literacy, observed level of motivation/effort, and the nature of the referral question. This battery was communicated to the psychometrist. Communication between Dr. Newman Nickels, Ph.D. and the psychometrist was ongoing throughout the evaluation and Dr. Newman Nickels, Ph.D. was immediately accessible at all times. Dr. Newman Nickels, Ph.D. provided supervision to the psychometrist on the date of this service to the extent necessary to assure the quality of all services provided.    Lawernce Ion Francesco Provencal will return within approximately 1-2 weeks for an interactive feedback session with Dr. Milbert Coulter at which time his test performances, clinical impressions, and treatment recommendations will be reviewed in detail. Mr. Jaquese Irving understands he can contact our office should he require our assistance before this time.  A total of 165 minutes of billable time were spent face-to-face with Mr. Philipe Laswell by the psychometrist. This includes both test administration and scoring time. Billing for these services is reflected in the clinical report generated by Dr. Newman Nickels, Ph.D..  This note reflects time spent with the psychometrician and does not  include test scores or any clinical interpretations made by Dr. Milbert Coulter. The full report will follow in a separate note.

## 2019-08-01 NOTE — Progress Notes (Addendum)
NEUROPSYCHOLOGICAL EVALUATION [place]. [facility] Department of Neurology  Date of Evaluation: August 01, 2019  Reason for Referral:   Christian Morgan is a 79 y.o. right-handed Hispanic male referred by Kerin Salen, D.O., to characterize his current cognitive functioning and assist with diagnostic clarity and treatment planning in the context of subjective cognitive decline and the presence of parkinsonisms.   Assessment and Plan:   Clinical Impression(s): Christian Morgan's pattern of performance is suggestive of fairly diffuse cognitive impairment, often in the well below average to exceptionally low normative ranges. Most prominent areas of impairment surrounded learning and memory and executive functioning; however, processing speed and some aspects of receptive and expressive language also scored quite low. Visuospatial abilities were variable, while relative strengths were exhibited across attention/concentration and phonemic fluency. Christian Morgan and his son reported difficulties independently completing instrumental activities of daily living (ADLs), particularly surrounding medication and financial management. This, coupled with evidence for significant cognitive dysfunction described above, suggests that he meets criteria for a Major Neurocognitive Disorder (formerly "dementia") at the present time.  Overall, the etiology for his difficulties is unclear. 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 This condition could explain pronounced executive dysfunction and a more rapid progression of cognitive deficits. However, it is noteworthy that he did not report other physical symptoms which are common in this condition (e.g., frequent backwards falling, swallowing difficulties, or significant rigidity/bradykinesia). Across memory measures, he was fully  amnestic and responded poorly across Y/N recognition trials. This, combined with poor confrontation naming, semantic worse than phonemic fluency, and ongoing executive dysfunction could also be concerning for Alzheimer's disease and this condition should be given some consideration as a primary etiology. Motor dysfunction appeared quite minimal relative to the extent of cognitive dysfunction. Also, as memory represents a primary area of impairment, while a good amount of visuospatial tasks performed within expectation, a Parkinson's disease dementia presentation seems less likely at the present time. Behavioral characteristics (e.g., no report of fully-formed hallucinations, fluctuations in alertness, or REM sleep disorder) makes Lewy body dementia less likely.  It also should be noted that Christian Morgan's son stated that his father had to retire early following cognitive difficulties stemming from a spinal infection (potentially due to [place]) many years prior. As there is no testing for comparison purposes, the extent to which cognitive decline occurred following this infection or the extent that decline has progressed over the years cannot be objectively verified. Continued medical monitoring will be important moving forward.   Recommendations: His more recent brain scan appears to be a CT scan back in 2018. Updated neuroimaging (e.g., brain MRI) would be beneficial to assess for any anatomical correlates for ongoing cognitive dysfunction.  He should discuss cognitive-based medication options with [name] or his PCP given ongoing memory difficulties.  A repeat neuropsychological evaluation in 12-18 months (or sooner if functional decline is noted) is could be considered to assess the trajectory of future cognitive decline should it occur. This will also aid in future efforts towards improved diagnostic clarity.  Performance across neurocognitive testing is not a strong predictor of an  individual's safety operating a motor vehicle. However, he did struggle with executive functioning and processing speed tasks, which does create concern surrounding his ability to drive safely. Should his family wish to pursue a formalized driving evaluation, they would be encouraged to contact [employer] in [place], [place] at [phone]. Another option would be through [facility]; however, the latter would likely require a referral from a medical doctor. [facility] can be reached directly at [phone].   Should there be a progression of his current deficits over time, [name] is unlikely to regain any independent living skills lost. Therefore, it is recommended that he remain as involved as possible in all aspects of household chores, finances, and medication management, with supervision to ensure adequate performance. He will likely benefit from the establishment and maintenance of a routine in order to maximize his functional abilities over time.  It will be important for [name] to have another person with him when in situations where he may need to process information, weigh the pros and cons of different options, and make decisions, in order to ensure that he fully understands and recalls all information to be considered.  If not already done, [name] and his family may want to discuss his wishes regarding durable power of attorney and medical decision making, so that he can have input into these choices. Additionally, they may wish to discuss future plans for caretaking and seek out community options for in home/residential care should they become necessary.  All information important to remember should be provided in written format in all instances. This should be placed in a highly visible and commonly frequented location to help promote recall.  Review of Records:   Christian Morgan was seen by [facility] Wells Guiles Tat, D.O.) on  07/27/2019 for an evaluation of tremor and memory loss. Christian Morgan reported a subtle tremor, mostly while at rest, in his right hand which had been improving. He denied family history of tremor, nor any newly added medications. Sleep was described as interrupted but he denied a history of REM sleep dysfunction or acting out his dreams. Dr. Carles Collet noted a shuffling gait and some slow movements. A history of falls was denied. Loss of taste/smell, incontinence, swallowing difficulties, visual/auditory hallucinations, or micrographia were also denied. No behavioral changes were noted. Dr. Carles Collet did note a slight vertical gaze palsy. Ultimately, Parkinson's disease was considered and it was decided to add carbidopa/levodopa 25/100. Performance on a cognitive screening instrument (MMSE) in his PCP's office a few weeks prior was 23/30. Ultimately, Mr. Masud Holub was referred for a comprehensive neuropsychological evaluation to characterize his cognitive abilities and to assist with diagnostic clarity and treatment planning.   Head CT on 01/23/2017 was negative. No other neuroimaging was available for review.   08/09/2019 Addendum: Brain MRI on 08/03/2019 revealed moderate diffuse volume loss, as well as midbrain atrophy with flattening of the superior aspect. The latter finding raises additional concerns surrounding progressive supranuclear palsy.   Past Medical History:  Diagnosis Date  . Allergic rhinitis   . Chronic headache   . Complication of anesthesia    after eye surgery, had trouble catching breath  . Daytime somnolence 01/05/2017  . Essential hypertension 10/31/2009  . Hard of hearing   . Hyperglycemia due to diabetes mellitus 09/20/2017  . Hyperlipidemia   . Hypertension   . OSA (obstructive sleep apnea) 2011   s/p septoplasty; was supposed to wear cpap but better after septoplasty  . Type I diabetes mellitus 10/31/2009    Past Surgical History:  Procedure Laterality Date  . BACK SURGERY      . COLONOSCOPY    . EYE SURGERY Bilateral    cataract surgery with lens  implants  . NASAL SEPTOPLASTY W/ TURBINOPLASTY Bilateral 01/11/2019   Procedure: NASAL SEPTOPLASTY;  Surgeon: Newman Pies, MD;  Location: [facility] OR;  Service: ENT;  Laterality: Bilateral;  . S/p Crani for CHI     subdural hematoma   . SHOULDER SURGERY  2009   Right  . TURBINATE REDUCTION Bilateral 01/11/2019   Procedure: Turbinate Reduction;  Surgeon: Newman Pies, MD;  Location: [facility] OR;  Service: ENT;  Laterality: Bilateral;    Current Outpatient Medications:  .  acetaminophen (TYLENOL) 500 MG tablet, Take 500 mg by mouth every 6 (six) hours as needed for moderate pain or headache., Disp: , Rfl:  .  carbidopa-levodopa (SINEMET IR) 25-100 MG tablet, Take 1 tablet by mouth 3 (three) times daily., Disp: 270 tablet, Rfl: 1 .  escitalopram (LEXAPRO) 5 MG tablet, Take 5 mg by mouth daily., Disp: , Rfl:  .  levocetirizine (XYZAL) 5 MG tablet, Take 5 mg by mouth every evening., Disp: , Rfl:  .  Light Mineral Oil-Mineral Oil (RETAINE MGD OP), Place 1 drop into the right eye daily as needed (watery eye)., Disp: , Rfl:  .  loratadine (CLARITIN) 10 MG tablet, Take 10 mg by mouth daily., Disp: , Rfl:  .  losartan (COZAAR) 100 MG tablet, Take 100 mg by mouth daily., Disp: , Rfl:  .  metFORMIN (GLUCOPHAGE-XR) 500 MG 24 hr tablet, Take 1,000 mg by mouth 2 (two) times daily., Disp: , Rfl:  .  Multiple Vitamins-Minerals (MULTIVITAMIN ADULT PO), Take 1 tablet by mouth every Monday, Wednesday, and Friday. , Disp: , Rfl:  .  rosuvastatin (CRESTOR) 5 MG tablet, Take 5 mg by mouth daily., Disp: , Rfl:   Clinical Interview:   Cognitive Symptoms: Decreased short-term memory: Endorsed. He described himself as having "moderate to poor" memory. More specifically, he described repeating himself and previous actions often, as well as some trouble remembering the details of previous conversations. He and his son denied difficulties remembering the names of  familiar individuals or misplacing objects.  Decreased long-term memory: Denied. Decreased attention/concentration: Denied. Reduced processing speed: Denied. Difficulties with executive functions: Denied. Overt personality changes were likewise denied.  Difficulties with emotion regulation: Denied. Difficulties with receptive language: Denied assuming that he can hear the source of the sound adequately.  Difficulties with word finding: Denied. Decreased visuoperceptual ability: Denied.  Trajectory of deficits: Memory difficulties were described as somewhat longstanding in nature, but have worsened over the past few months. His son reported that his father experienced a spinal infection, potentially due to an insect bite, during the height of the most recent [place] virus outbreak. His son reported that cognitive deficits were experienced following this event, but seemed to stabilize over time.   Difficulties completing ADLs: Endorsed. His son manages and organizes his medications. His wife managed their finances for a long time; this has now also been taken over by his son. Mr. Ashanti Littles reported driving without issue; his son agreed with this assessment.   Additional Medical History: History of traumatic brain injury/concussion: Endorsed. Around the time his son was in high school, Mr. Kelson Queenan sustained a subdural hematoma requiring surgical intervention. It is was unclear what prompted this injury. He and his son noted that he boxed during his time in the [employer]	[place] and also had slipped and fallen around that time. Persisting deficits from this injury were not outwardly endorsed. No other potential concussive injuries were reported.  History of stroke: Denied. History of seizure activity: Denied. History of  known exposure to toxins: Denied. Symptoms of chronic pain: Denied. Experience of frequent headaches/migraines: Denied. Frequent instances of dizziness/vertigo:  Denied.  Sensory changes: He wears glasses with positive effect. He is also mildly hard of hearing. His son noted that he often does not wear hearing aids. Other sensory changes/difficulties (e.g., taste or smell) were denied.  Balance/coordination difficulties: Denied. He also denied a history of recent falls. His son did report a slowed, shuffling gait which led to concerns surrounding Parkinson's disease.  Other motor difficulties: Endorsed. He reported a very subtle tremor isolated to his right hand. His son noted that these symptoms have notably improved since starting a B12 supplement. Mr. Corbyn Wildey planned to start carbidopa-levodopa medications tomorrow (08/02/19).   Sleep History: Estimated hours obtained each night: 8 hours. Difficulties falling asleep: Endorsed "occasionally."  Difficulties staying asleep: Endorsed. He reported waking to use the restroom, as well as instances where he awakes in order to have a snack due to his history of diabetes. Overall, he described the sleep he is able to obtain as "broken" in nature.  Feels rested and refreshed upon awakening: Endorsed.  History of snoring: Endorsed. History of waking up gasping for air: Endorsed. Witnessed breath cessation while asleep: Endorsed. Medical records suggest a history of sleep apnea. However, these symptoms (including snoring) were said to have ceased following surgical repair for a deviated septum.   History of vivid dreaming: Denied. Excessive movement while asleep: Denied. Instances of acting out his dreams: Denied.  Psychiatric/Behavioral Health History: Depression: Denied. He described his current mood as "fairly well" and denied a history of mental health concerns or diagnoses. However, he did acknowledge previously working with a psychologist to address work-related stressors with success. Current or remote suicidal ideation, intent, or plan were denied.  Anxiety: Denied. Mania: Denied. Trauma History:  Denied. Visual/auditory hallucinations: Denied. Delusional thoughts: Denied.  Tobacco: Denied. He reported quitting in the distant past.  Alcohol: He reported infrequent alcohol consumption (maybe 1-2 beers per week) and denied a history of problematic alcohol abuse or dependence.  Recreational drugs: Denied. Caffeine: 1-2 cups of coffee in the morning.   Family History: Problem Relation Age of Onset  . Stroke Sister   . Lung cancer Brother   . Diabetes Mother   . Depression Mother   . Alcohol abuse Father    This information was confirmed by Mr. Demetreus Lothamer.  Academic/Vocational History: Highest level of educational attainment: 16 years. Mr. Lorris Carducci was born in [place] but was raised in [place]. 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 He described himself as an average (B) student in academic settings. No relative weaknesses were identified.  History of developmental delay: Denied. History of grade repetition: Denied. Enrollment in special education courses: Denied. History of LD/ADHD: Denied.  Employment: Retired. He previously served in the [employer]. 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 He was forced to retire early due to cognitive difficulties stemming from a spinal infection and potential [place] virus exposure.   Evaluation Results:   Behavioral Observations: Mr. Deloy Archey was accompanied by his son, arrived to his appointment on time, and was appropriately dressed and groomed. He appeared alert and oriented. He ambulated slowly with a very mild shuffling gait. A very subtle tremor was observed in his right hand. His affect was generally relaxed and positive, but did range appropriately given the subject being  discussed during the clinical interview or the task at hand during testing procedures. Spontaneous speech was fluent and word finding  difficulties were not observed during the clinical interview. However, the latter were observed during testing procedures. Thought processes were coherent, organized, and normal in content. Insight into his cognitive difficulties appeared limited at times. During testing, sustained attention was appropriate. Task engagement was adequate and he persisted when challenged. Task instructions were required to be repeated and there were some tasks where he appeared to have trouble comprehending task instructions. These were generally noted across more complex tasks. Overall, Mr. Yahir Tavano was cooperative with the clinical interview and subsequent testing procedures.   Adequacy of Effort: The validity of neuropsychological testing is limited by the extent to which the individual being tested may be assumed to have exerted adequate effort during testing. Mr. Navdeep Halt expressed his intention to perform to the best of his abilities and exhibited adequate task engagement and persistence. Scores across stand-alone and embedded performance validity measures were variable. However, below expectation scores are believed to represent true cognitive dysfunction rather than poor engagement or attempts to perform poorly. As such, the results of the current evaluation are believed to be a valid representation of Christian Morgan's current cognitive functioning.  Test Results: Mr. Sung Parodi was disoriented at the time of the current evaluation. He incorrectly stated his current age ("66"). He was also unable to recall his phone number, as well as the current year, month, date, or day of the week.  Intellectual abilities based upon educational and vocational attainment were estimated to be in the average range. Premorbid abilities were estimated to be within the average range based upon a single-word reading test.   Processing speed was exceptionally low to well below average, both when tested with and without a  motorized component. Basic attention was average. More complex attention (e.g., working memory) was below average to average. Executive functioning was exceptionally low and represented a noted weakness across the current evaluation.  Assessed receptive language abilities were exceptionally low. Assessed expressive language (e.g., verbal fluency and confrontation naming) was somewhat variable. Phonemic fluency was below average, while semantic fluency and confrontation naming were exceptionally low.     Assessed visuospatial/visuoconstructional abilities were variable, with scores ranging from the exceptionally low to average normative ranges.    Learning (i.e., encoding) of novel verbal information was exceptionally low. Spontaneous delayed recall (i.e., retrieval) of previously learned information was also exceptionally low. Retention rates were 0% across a story learning task, 0% across a list learning task, and 0% across a complex figure drawing task. Performance across recognition tasks was likewise poor, suggesting limited to minimal evidence for information consolidation.   Results of emotional screening instruments suggested that recent symptoms of generalized anxiety were in the minimal range, while symptoms of depression were within normal limits. A screening instrument assessing recent sleep quality suggested the presence of minimal sleep dysfunction. Across a Parkinson's disease symptom inventory, his most prominent area of difficulty ("mildly affected") was said to surround cognitive difficulties.   Tables of Scores:   Note: This summary of test scores accompanies the interpretive report and should not be considered in isolation without reference to the appropriate sections in the text. Descriptors are based on appropriate normative data and may be adjusted based on clinical judgment. The terms "impaired" and "within normal limits (WNL)" are used when a more specific level of functioning  cannot be determined.       Effort Testing:  DESCRIPTOR       Dot Counting Test: --- --- Within Expectation  RBANS Effort Index: --- --- Below Expectation  WAIS-IV Reliable Digit Span: --- --- Within Expectation       Orientation:      Raw Score Percentile   NAB Orientation, Form 1 19/29 --- ---       Cognitive Screening:          RBANS, Form A: Standard Score/ Scaled Score Percentile   Total Score 58 <1 Exceptionally Low  Immediate Memory 49 <1 Exceptionally Low    List Learning 1 <1 Exceptionally Low    Story Memory 3 1 Exceptionally Low  Visuospatial/Constructional 96 39 Average    Figure Copy 10 50 Average    Line Orientation 15/20 26-50 Average  Language 60 <1 Exceptionally Low    Picture Naming 8/10 3-9 Well Below Average    Semantic Fluency 2 <1 Exceptionally Low  Attention 91 27 Average    Digit Span 14 91 Above Average    Coding 3 1 Exceptionally Low  Delayed Memory 40 <1 Exceptionally Low    List Recall 0/10 <2 Exceptionally Low    List Recognition 8/20 <2 Exceptionally Low    Story Recall 1 <1 Exceptionally Low    Story Recognition 6/12 5-6 Well Below Average    Figure Recall 1 <1 Exceptionally Low    Figure Recognition 2/8 1-5 Well Below Average       Intellectual Functioning:           Standard Score Percentile   Test of Premorbid Functioning: 93 32 Average       Attention/Executive Function:          Trail Making Test (TMT): Raw Score (T Score) Percentile     Part A 70 secs.,  0 errors (33) 5 Well Below Average    Part B 300 secs.,  3 errors (21) <1 Exceptionally Low        Symbol Digit Modalities Test (SDMT): Raw Score (Z-Score) Percentile     Oral 24 (-2.13) 2 Exceptionally Low        Scaled Score Percentile   WAIS-IV Digit Span: 8 25 Average    Forward 9 37 Average    Backward 7 16 Below Average    Sequencing 9 37 Average       D-KEFS Color-Word Interference Test: Raw Score (Scaled Score) Percentile     Color Naming 48 secs. (5) 5 Well  Below Average    Word Reading 43 secs. (2) <1 Exceptionally Low    Inhibition 180 secs. (1) <1 Exceptionally Low      Total Errors 13 errors (3) 1 Exceptionally Low    Inhibition/Switching 180 secs. (1) <1 Exceptionally Low      Total Errors 18 errors (1) <1 Exceptionally Low       D-KEFS 20 Questions Test: Scaled Score Percentile     Total Weighted Achievement Score Discontinued --- Impaired    Initial Abstraction Score --- --- ---       NAB Executive Functions Module, Form 1: T Score Percentile     Judgment 23 <1 Exceptionally Low       Language:          Verbal Fluency Test: Raw Score (Scaled Score) Percentile     Phonemic Fluency (CFL) 20 (6) 9 Below Average    Category Fluency 11 (2) <1 Exceptionally Low  *Based on [employer]'s Older Normative Studies (MOANS)  NAB Language Module, Form 1: T Score Percentile     Auditory Comprehension 24 <1 Exceptionally Low    Naming 21/31 (19) <1 Exceptionally Low       Visuospatial/Visuoconstruction:      Raw Score Percentile   Clock Drawing: 8/10 --- Within Normal Limits       NAB Spatial Module, Form 1: T Score Percentile     Visual Discrimination 26 1 Exceptionally Low    Design Construction 37 9 Below Average        Scaled Score Percentile   WAIS-IV Matrix Reasoning: 10 50 Average       Mood and Personality:      Raw Score Percentile   Geriatric Depression Scale: 4 --- Within Normal Limits  Geriatric Anxiety Scale: 7 --- Minimal    Somatic 1 --- Minimal    Cognitive 5 --- Mild    Affective 1 --- Minimal       Additional Questionnaires:      Raw Score Percentile   PROMIS Sleep Disturbance Questionnaire: 22 --- None to Slight       Parkinson's Disease Questionnaire-39: Raw Score Percentile     Mobility 4 10 Minimally Affected    Activities of Daily Living 0 0 Minimally Affected    Emotional Well-Being 1 4 Minimally Affected    Stigma 0 0 Minimally Affected    Social Support 0 0 Minimally Affected    Cognitions 3 19  Mildly Affected    Communication 0 0 Minimally Affected    Bodily Discomfort 0 0 Minimally Affected   Informed Consent and Coding/Compliance:   Mr. Jefm PettyCorrea Morgan was provided with a verbal description of the nature and purpose of the present neuropsychological evaluation. Also reviewed were the foreseeable risks and/or discomforts and benefits of the procedure, limits of confidentiality, and mandatory reporting requirements of this provider. The patient was given the opportunity to ask questions and receive answers about the evaluation. Oral consent to participate was provided by the patient.   This evaluation was conducted by Newman NickelsZachary C. Zein Helbing, Ph.D., licensed clinical neuropsychologist. Mr. Jefm PettyCorrea Morgan completed a comprehensive clinical interview with Dr. Milbert CoulterMerz, billed as one unit [phone], and 165 minutes of cognitive testing and scoring, billed as one unit [phone] and five additional units [id number]. Psychometrist Wallace Kellerana Chamberlain, B.S., assisted Dr. Milbert CoulterMerz with test administration and scoring procedures. As a separate and discrete service, Dr. Milbert CoulterMerz spent a total of 180 minutes in interpretation and report writing billed as one unit [phone] and two units [id number].

## 2019-08-02 ENCOUNTER — Telehealth: Payer: Self-pay | Admitting: Neurology

## 2019-08-02 ENCOUNTER — Encounter: Payer: Self-pay | Admitting: Psychology

## 2019-08-02 DIAGNOSIS — F0390 Unspecified dementia without behavioral disturbance: Secondary | ICD-10-CM | POA: Insufficient documentation

## 2019-08-02 DIAGNOSIS — G9349 Other encephalopathy: Secondary | ICD-10-CM

## 2019-08-02 DIAGNOSIS — G20 Parkinson's disease: Secondary | ICD-10-CM

## 2019-08-02 HISTORY — DX: Unspecified dementia, unspecified severity, without behavioral disturbance, psychotic disturbance, mood disturbance, and anxiety: F03.90

## 2019-08-02 NOTE — Telephone Encounter (Signed)
Spoke with son and gave him Dr Don Perking recommendations. Son is agreeable with MRI of Brain. Informed son that once test is approved through the insurance a scheduler from [facility] will contact them to schedule the test. Son voiced understanding.   Orders placed.

## 2019-08-02 NOTE — Telephone Encounter (Signed)
Spoke with Dr. Milbert Coulter and we both agree that it would be benefical to have updated neuroimaging in this patient.  If agreeable, order MRI brain without gad.  Dx:  Amnesia; Parkinsons Disease ; chronic encephalopathy

## 2019-08-03 ENCOUNTER — Ambulatory Visit
Admission: RE | Admit: 2019-08-03 | Discharge: 2019-08-03 | Disposition: A | Discharge status: Home / Self Care | Payer: Medicare Other | Source: Ambulatory Visit | Attending: Neurology | Admitting: Neurology

## 2019-08-03 ENCOUNTER — Other Ambulatory Visit: Payer: Self-pay

## 2019-08-04 ENCOUNTER — Telehealth: Payer: Self-pay | Admitting: Neurology

## 2019-08-04 NOTE — Telephone Encounter (Signed)
Tried calling no answer at 350

## 2019-08-04 NOTE — Telephone Encounter (Signed)
Patients son states they are expecting a call from [name] today to discuss his fathers MRI results. Wanted to make [name] aware that he will not be able to answer the phone after 3pm, as he will be at work.

## 2019-08-04 NOTE — Telephone Encounter (Signed)
let pt/son know that brain has shrunk over the years (not uncommon with aging brains).  Nothing acute/new on it.

## 2019-08-07 NOTE — Telephone Encounter (Signed)
Pt son called an advised.

## 2019-08-09 ENCOUNTER — Other Ambulatory Visit: Payer: Self-pay

## 2019-08-09 ENCOUNTER — Telehealth: Payer: Self-pay | Admitting: Neurology

## 2019-08-09 ENCOUNTER — Ambulatory Visit (INDEPENDENT_AMBULATORY_CARE_PROVIDER_SITE_OTHER): Payer: Medicare Other | Admitting: Psychology

## 2019-08-09 DIAGNOSIS — F0150 Vascular dementia without behavioral disturbance: Secondary | ICD-10-CM | POA: Diagnosis not present

## 2019-08-09 DIAGNOSIS — F0390 Unspecified dementia without behavioral disturbance: Secondary | ICD-10-CM

## 2019-08-09 NOTE — Patient Instructions (Signed)
He should discuss cognitive-based medication options with [name] or his PCP given ongoing memory difficulties.  A repeat neuropsychological evaluation in 12-18 months (or sooner if functional decline is noted) is could be considered to assess the trajectory of future cognitive decline should it occur. This will also aid in future efforts towards improved diagnostic clarity.  Performance across neurocognitive testing is not a strong predictor of an individual's safety operating a motor vehicle. However, he did struggle with executive functioning and processing speed tasks, which does create concern surrounding his ability to drive safely. Should his family wish to pursue a formalized driving evaluation, they would be encouraged to [employer] in [place], [place] at [phone].Another option would be through [facility]; however, the latter would likely require a referral from a medical doctor. [facility] can be reached directly at [phone].  Should there be a progression of his current deficits over time, [name] is unlikely to regain any independent living skills lost. Therefore, it is recommended that he remain as involved as possible in all aspects of household chores, finances, and medication management, with supervision to ensure adequate performance. He will likely benefit from the establishment and maintenance of a routine in order to maximize his functional abilities over time.  It will be important for [name] to have another person with him when in situations where he may need to process information, weigh the pros and cons of different options, and make decisions, in order to ensure that he fully understands and recalls all information to be considered.  If not already done, [name] and his family may want to discuss his wishes regarding durable power of attorney and medical decision making, so that he can have input into  these choices. Additionally, they may wish to discuss future plans for caretaking and seek out community options for in home/residential care should they become necessary.  All information important to remember should be provided in written format in all instances. This should be placed in a highly visible and commonly frequented location to help promote recall.

## 2019-08-09 NOTE — Progress Notes (Signed)
   Neuropsychology Feedback Session Christian Morgan. [facility] [place] Department of Neurology  Reason for Referral:   [name] a 79 y.o. right-handed Hispanic male referred by Kerin Salen, D.O.,to characterize hiscurrent cognitive functioning and assist with diagnostic clarity and treatment planning in the context of subjective cognitive decline and the presence of parkinsonisms.   Feedback:   [name] completed a comprehensive neuropsychological evaluation on 08/01/2019. Please refer to that encounter for the full report and recommendations. Briefly, results suggested fairly diffuse cognitive impairment, often in the well below average to exceptionally low normative ranges. Most prominent areas of impairment surrounded learning and memory and executive functioning; however, processing speed and some aspects of receptive and expressive language also scored quite low. Overall, the etiology for his difficulties is unclear. 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 This condition could explain pronounced executive dysfunction and a more rapid progression of cognitive deficits. However, it is noteworthy that he did not report other physical symptoms which are common in this condition (e.g., frequent backwards falling, swallowing difficulties, or significant rigidity/bradykinesia). Across memory measures, he was fully amnestic and responded poorly across Y/N recognition trials. This, combined with poor confrontation naming, semantic worse than phonemic fluency, and ongoing executive dysfunction could also be concerning for Alzheimer's disease and this condition should be given some consideration as a primary etiology.  [name] was accompanied by his wife and son during the current telephone call. They were located at their residence while I was located within  my office. Content of the current session focused on the results of his neuropsychological evaluation. [name] and his family were given the opportunity to ask questions and their questions were answered. They were encouraged to reach out should additional questions arise. A copy of his report was mailed at the conclusion of the visit.      36 minutes were spent conducting the current feedback session with [name], billed as one unit [phone].

## 2019-08-09 NOTE — Telephone Encounter (Signed)
Please advise 

## 2019-08-09 NOTE — Telephone Encounter (Signed)
Patient's son called to ask if [name] has had a chance to review the results from his fathers neuropsych testing. And if so, does she want to see the patient sooner than 01/29/20? States his father hasn't been on the carbidopa-levodopa very long so he isn't sure if [name] will want to wait a little longer for patient to get adjusted to meds.

## 2019-08-10 NOTE — Telephone Encounter (Signed)
Patient called an advised  

## 2019-08-10 NOTE — Telephone Encounter (Signed)
Pt will get results from neuropsych testing directly from the neuropsychologist, if they haven't already Yes, needs to be on levodopa longer.  Will see him at scheduled f/u

## 2019-08-30 ENCOUNTER — Other Ambulatory Visit: Payer: Medicare Other

## 2019-09-11 ENCOUNTER — Telehealth: Payer: Self-pay | Admitting: Neurology

## 2019-09-11 NOTE — Telephone Encounter (Signed)
Ok with me 

## 2019-09-11 NOTE — Telephone Encounter (Signed)
That isn't a side effect of the medication.  The disease can cause constipation but the medication doesn't cause gas.  Here is some things I recommend for constipation if an issue:  Constipation and Parkinson's disease:  1.Rancho recipe for constipation in Parkinsons Disease:  -1 cup of unprocessed bran (need to get this at [employer], [employer] or similar type of store), 2 cups of applesauce in 1 cup of prune juice 2.  Increase fiber intake (Metamucil,vegetables) 3.  Regular, moderate exercise can be beneficial. 4.  Avoid medications causing constipation, such as medications like antacids with calcium or magnesium 5.  It's okay to take daily Miralax, and taper if stools become too loose or you experience diarrhea 6.  Stool softeners (Colace) can help with chronic constipation and I recommend you take this daily. 7.  Increase water intake.  You should be drinking 1/2 gallon of water a day as long as you have not been diagnosed with congestive heart failure or renal/kidney failure.  This is probably the single greatest thing that you can do to help your constipation.

## 2019-09-11 NOTE — Telephone Encounter (Signed)
Patients son and patient has been notified and voiced understanding. He stated the patient takes apple cider vinegar (a spoonful wit juice) and wants to know if this ok to continue take. Son states it helped everyone in the household and wants to know if its ok for the patient to continue taking.

## 2019-09-11 NOTE — Telephone Encounter (Signed)
Carbidopa-levodopa is giving the patient "too much gas". He would like to see if there is something he can do? He requested a call back tomorrow due to the son being at work.

## 2019-09-12 NOTE — Telephone Encounter (Signed)
Patients son voiced understanding.   Patients son states they will be going to the pharmacy to discuss medications that will help with gas and thanked me for calling.

## 2020-01-03 ENCOUNTER — Encounter: Payer: Self-pay | Admitting: Podiatry

## 2020-01-03 ENCOUNTER — Ambulatory Visit: Payer: Medicare Other | Admitting: Podiatry

## 2020-01-03 ENCOUNTER — Other Ambulatory Visit: Payer: Self-pay

## 2020-01-03 DIAGNOSIS — E119 Type 2 diabetes mellitus without complications: Secondary | ICD-10-CM | POA: Insufficient documentation

## 2020-01-03 DIAGNOSIS — M79675 Pain in left toe(s): Secondary | ICD-10-CM

## 2020-01-03 DIAGNOSIS — B351 Tinea unguium: Secondary | ICD-10-CM | POA: Diagnosis not present

## 2020-01-03 DIAGNOSIS — M79674 Pain in right toe(s): Secondary | ICD-10-CM | POA: Diagnosis not present

## 2020-01-03 NOTE — Progress Notes (Signed)
This patient returns to my office for at risk foot care.  This patient requires this care by a professional since this patient will be at risk due to having type 1 diabetes. He presents to the office with his son. This patient is unable to cut nails himself since the patient cannot reach his nails.These nails are painful walking and wearing shoes.  This patient presents for at risk foot care today.  General Appearance  Alert, conversant and in no acute stress.  Vascular  Dorsalis pedis and posterior tibial  pulses are palpable  Right.  Weakly palpable pulses left foot..  Capillary return is within normal limits  bilaterally. Temperature is within normal limits  bilaterally.  Neurologic  Senn-Weinstein monofilament wire test within normal limits  bilaterally. Muscle power within normal limits bilaterally.  Nails Thick disfigured discolored nails with subungual debris  from hallux to fifth toes bilaterally. No evidence of bacterial infection or drainage bilaterally.  Orthopedic  No limitations of motion  feet .  No crepitus or effusions noted.  No bony pathology or digital deformities noted.  Skin  normotropic skin with no porokeratosis noted bilaterally.  No signs of infections or ulcers noted.     Onychomycosis  Pain in right toes  Pain in left toes  Consent was obtained for treatment procedures.   Mechanical debridement of nails 1-5  bilaterally performed with a nail nipper.  Filed with dremel without incident. Diabetic foot exam performed.   Return office visit  4 months                     Told patient to return for periodic foot care and evaluation due to potential at risk complications.   Helane Gunther DPM

## 2020-01-22 NOTE — Progress Notes (Signed)
Assessment/Plan:   1.  Parkinsons Disease   -Take carbidopa/levodopa 25/100, 1 tablet 3 times per day.  2.  Dementia  -Neurocognitive testing in June, 2021 with evidence of dementia, both parkinsonian and Alzheimer's.  -Patient should not be driving but is so I reiterated that no driving should be done.  -Meds should be monitored.  -discussed aricept.  Pt/son would like to try.  R/B/SE were discussed.  The opportunity to ask questions was given and they were answered to the best of my ability.  The patient expressed understanding and willingness to follow the outlined treatment protocols.  -regular daily scheduled discussed  3.  B12 deficiency  -Continue over-the-counter B12. Subjective:   Christian Morgan was seen today in follow up for parkinsonism.  My previous records were reviewed prior to todays visit as well as outside records available to me.  Patient accompanied by son who supplements the history.  Levodopa was started last visit and patient reports that the medication has helped.  It has less tremor.  We also started him on a B12 supplement given his deficiency.  He had neurocognitive testing with Dr. Milbert Coulter in June, 2021.  That was reviewed and discussed with Dr. Milbert Coulter.  There was evidence of dementia, possibly Alzheimer's dementia combined with possible parkinsonian related dementia ( ?PSP).  Patient lives with son but he works but wife in the home and her memory isn't as bad (she helps with meds).  Pt denies falls.  Pt denies lightheadedness, near syncope.  No hallucinations.  Mood has been good.  Son off work this week and noted when patient was put on regular schedule and not sleeping in day and awake at night, did much better.  However, son works nights and comes home in middle of the the night and pt wakes up with him and wants to eat and then sleep in the day.    Current prescribed movement disorder medications: Carbidopa/levodopa 25/100, 1 tablet 3 times per day started  last visit (son fills pill box and then calls as a reminder to take - lives with wife) B12 (started last visit)   ALLERGIES:   Allergies  Allergen Reactions  . Lipitor [Atorvastatin] Other (See Comments)    Increased liver enzymes and joint pain from numerous statins  . Penicillins Hives and Rash    Did it involve swelling of the face/tongue/throat, SOB, or low BP? No Did it involve sudden or severe rash/hives, skin peeling, or any reaction on the inside of your mouth or nose? No Did you need to seek medical attention at a hospital or doctor's office? yes When did it last happen? 30 years If all above answers are "NO", may proceed with cephalosporin use.  . Amitriptyline Hcl     Unknown reaction  . Cyclobenzaprine     Burning in stomach  . Gabapentin     Dizziness   . Other Other (See Comments)    [place]: "Terrible migraines."  . Zetia [Ezetimibe]     Joint pain  . Aspirin Other (See Comments)    Upset stomach  . Doxycycline Hives    Reaction took 4-5 days  . Iodinated Diagnostic Agents Hives    Developed hives after contrast "50 years ago when I had a subdural hematoma" at age 53.  Tolerates now with Benadryl 50mg  PO one hour before contrast.  . Levofloxacin Hives    Delayed allergic reaction    CURRENT MEDICATIONS:  Outpatient Encounter Medications as of 01/29/2020  Medication Sig  . acetaminophen (TYLENOL) 500 MG tablet Take 500 mg by mouth every 6 (six) hours as needed for moderate pain or headache.  . betamethasone dipropionate 0.05 % cream Apply 1 application topically 2 (two) times daily.  . carbidopa-levodopa (SINEMET IR) 25-100 MG tablet Take 1 tablet by mouth 3 (three) times daily.  Marland Kitchen escitalopram (LEXAPRO) 10 MG tablet Take 5 mg by mouth daily.   Sherlynn Stalls Mineral Oil-Mineral Oil (RETAINE MGD OP) Place 1 drop into the right eye daily as needed (watery eye).  Marland Kitchen losartan (COZAAR) 100 MG tablet Take 100 mg by mouth daily.  . metFORMIN (GLUCOPHAGE-XR) 500 MG 24  hr tablet Take 1,000 mg by mouth 2 (two) times daily.  . rosuvastatin (CRESTOR) 5 MG tablet Take 5 mg by mouth daily.  . vitamin B-12 (CYANOCOBALAMIN) 1000 MCG tablet Take 1,000 mcg by mouth daily.  Marland Kitchen levocetirizine (XYZAL) 5 MG tablet Take 5 mg by mouth every evening. (Patient not taking: Reported on 01/29/2020)  . loratadine (CLARITIN) 10 MG tablet Take 10 mg by mouth daily. (Patient not taking: Reported on 01/29/2020)  . [DISCONTINUED] Multiple Vitamins-Minerals (MULTIVITAMIN ADULT PO) Take 1 tablet by mouth every Monday, Wednesday, and Friday.  (Patient not taking: Reported on 01/29/2020)   No facility-administered encounter medications on file as of 01/29/2020.    Objective:   PHYSICAL EXAMINATION:    VITALS:   Vitals:   01/29/20 0818  BP: (!) 145/75  Pulse: 80  SpO2: 98%  Weight: 222 lb (100.7 kg)  Height: 5\' 6"  (1.676 m)    GEN:  The patient appears stated age and is in NAD. HEENT:  Normocephalic, atraumatic.  The mucous membranes are moist. The superficial temporal arteries are without ropiness or tenderness. CV:  RRR Lungs:  CTAB Neck/HEME:  There are no carotid bruits bilaterally.  Neurological examination:  Orientation: The patient is alert and oriented x2. Cranial nerves: There is good facial symmetry with mild facial hypomimia. The speech is fluent and clear. Soft palate rises symmetrically and there is no tongue deviation. Hearing is intact to conversational tone. Sensation: Sensation is intact to light touch throughout Motor: Strength is at least antigravity x4.  Movement examination: Tone: There is minimal increased tone in the RUE.  Tone is normal in the LUE/LLE Abnormal movements: there is rare RUE rest tremor Coordination:  There is no significant decremation with rams Gait and Station: The patient has no difficulty arising out of a deep-seated chair without the use of the hands. The patient's stride length is decreased  I have reviewed and interpreted  the following labs independently    Chemistry      Component Value Date/Time   NA 139 01/11/2019 0649   K 4.0 01/11/2019 0649   CL 106 01/11/2019 0649   CO2 24 01/11/2019 0649   BUN 23 01/11/2019 0649   CREATININE 1.06 01/11/2019 0649      Component Value Date/Time   CALCIUM 9.2 01/11/2019 0649       Lab Results  Component Value Date   WBC 7.2 01/11/2019   HGB 12.3 (L) 01/11/2019   HCT 37.9 (L) 01/11/2019   MCV 94.3 01/11/2019   PLT 257 01/11/2019    No results found for: TSH   Total time spent on today's visit was 30 minutes, including both face-to-face time and nonface-to-face time.  Time included that spent on review of records (prior notes available to me/labs/imaging if pertinent), discussing treatment and goals, answering patient's questions and coordinating care.  Cc:  [name], MD

## 2020-01-29 ENCOUNTER — Encounter: Payer: Self-pay | Admitting: Neurology

## 2020-01-29 ENCOUNTER — Other Ambulatory Visit: Payer: Self-pay

## 2020-01-29 ENCOUNTER — Ambulatory Visit: Payer: Medicare Other | Admitting: Neurology

## 2020-01-29 VITALS — BP 145/75 | HR 80 | Ht 66.0 in | Wt 222.0 lb

## 2020-01-29 DIAGNOSIS — G20 Parkinson's disease: Secondary | ICD-10-CM

## 2020-01-29 DIAGNOSIS — G20A1 Parkinson's disease without dyskinesia, without mention of fluctuations: Secondary | ICD-10-CM

## 2020-01-29 MED ORDER — CARBIDOPA-LEVODOPA 25-100 MG PO TABS: 1.0000 | ORAL_TABLET | Freq: Three times a day (TID) | ORAL | 1 refills | Status: DC

## 2020-01-29 MED ORDER — DONEPEZIL HCL 5 MG PO TABS: 5.0000 mg | ORAL_TABLET | Freq: Every day | ORAL | 1 refills | Status: DC

## 2020-01-29 NOTE — Patient Instructions (Addendum)
1.  Start donepezil, 5 mg in the AM 2.  Continue carbidopa/levodopa 25/100, 1 tablet at 8am/noon/4pm 3.  Continue oral b12 4.  Keep a regular daily schedule 5.  No driving  The physicians and staff at [facility] are committed to providing excellent care. You may receive a survey requesting feedback about your experience at our office. We strive to receive "very good" responses to the survey questions. If you feel that your experience would prevent you from giving the office a "very good " response, please contact our office to try to remedy the situation. We may be reached at [phone]. Thank you for taking the time out of your busy day to complete the survey.

## 2020-02-05 ENCOUNTER — Telehealth: Payer: Self-pay

## 2020-02-05 NOTE — Telephone Encounter (Signed)
No.  Discussed at visit.  Dr. Milbert Coulter discussed as well I believe.  Thank you for asking.

## 2020-02-05 NOTE — Telephone Encounter (Signed)
Advised 

## 2020-02-05 NOTE — Telephone Encounter (Signed)
Patient wants to know if her can drive a few minutes up the road to walk his dog at the park.    He states he is not going far and will not get lost.

## 2020-03-05 ENCOUNTER — Telehealth: Payer: Self-pay | Admitting: Neurology

## 2020-03-05 NOTE — Telephone Encounter (Signed)
Patients son notified directly and voiced understanding. 

## 2020-03-05 NOTE — Telephone Encounter (Signed)
Left message for patient son to contact office.

## 2020-03-05 NOTE — Telephone Encounter (Signed)
Patient's son called and said, "For the last two days my father seems to be having trouble picking up his feet. He seems a little sluggish. Today he seems better. I don't know if he needs an appointment or not but I'd like a call back about this."

## 2020-03-05 NOTE — Telephone Encounter (Signed)
The sx's of Parkinsons Disease can definitely wax and wane, esp picking up feet vs shuffling.  However, if sluggish, they should f/u with PCP to make sure no UTI, etc.

## 2020-03-05 NOTE — Telephone Encounter (Signed)
I would doubt that especially if feeling better today.  He can hold it if they would like but again if feeling better today, it is unlikely

## 2020-03-05 NOTE — Telephone Encounter (Signed)
Patient's son and the patient called in together. The patient said he is feeling much better today, but was wondering if it could be his new donepezil?

## 2020-03-06 DIAGNOSIS — M25561 Pain in right knee: Secondary | ICD-10-CM | POA: Diagnosis not present

## 2020-03-06 DIAGNOSIS — R52 Pain, unspecified: Secondary | ICD-10-CM | POA: Diagnosis not present

## 2020-03-06 DIAGNOSIS — M25562 Pain in left knee: Secondary | ICD-10-CM | POA: Diagnosis not present

## 2020-03-11 DIAGNOSIS — M545 Low back pain, unspecified: Secondary | ICD-10-CM | POA: Diagnosis not present

## 2020-03-11 DIAGNOSIS — M25552 Pain in left hip: Secondary | ICD-10-CM | POA: Diagnosis not present

## 2020-03-14 DIAGNOSIS — M25561 Pain in right knee: Secondary | ICD-10-CM | POA: Diagnosis not present

## 2020-03-25 DIAGNOSIS — M25561 Pain in right knee: Secondary | ICD-10-CM | POA: Diagnosis not present

## 2020-04-01 DIAGNOSIS — E1165 Type 2 diabetes mellitus with hyperglycemia: Secondary | ICD-10-CM | POA: Diagnosis not present

## 2020-04-01 DIAGNOSIS — E782 Mixed hyperlipidemia: Secondary | ICD-10-CM | POA: Diagnosis not present

## 2020-04-01 DIAGNOSIS — E1169 Type 2 diabetes mellitus with other specified complication: Secondary | ICD-10-CM | POA: Diagnosis not present

## 2020-04-01 DIAGNOSIS — G20 Parkinson's disease: Secondary | ICD-10-CM | POA: Diagnosis not present

## 2020-04-01 DIAGNOSIS — E11649 Type 2 diabetes mellitus with hypoglycemia without coma: Secondary | ICD-10-CM | POA: Diagnosis not present

## 2020-04-01 DIAGNOSIS — E7800 Pure hypercholesterolemia, unspecified: Secondary | ICD-10-CM | POA: Diagnosis not present

## 2020-04-01 DIAGNOSIS — E119 Type 2 diabetes mellitus without complications: Secondary | ICD-10-CM | POA: Diagnosis not present

## 2020-04-01 DIAGNOSIS — G4700 Insomnia, unspecified: Secondary | ICD-10-CM | POA: Diagnosis not present

## 2020-04-01 DIAGNOSIS — I10 Essential (primary) hypertension: Secondary | ICD-10-CM | POA: Diagnosis not present

## 2020-04-04 DIAGNOSIS — M1711 Unilateral primary osteoarthritis, right knee: Secondary | ICD-10-CM | POA: Diagnosis not present

## 2020-04-05 DIAGNOSIS — R5383 Other fatigue: Secondary | ICD-10-CM | POA: Diagnosis not present

## 2020-04-05 DIAGNOSIS — M25569 Pain in unspecified knee: Secondary | ICD-10-CM | POA: Diagnosis not present

## 2020-04-11 DIAGNOSIS — M1711 Unilateral primary osteoarthritis, right knee: Secondary | ICD-10-CM | POA: Diagnosis not present

## 2020-04-15 DIAGNOSIS — R634 Abnormal weight loss: Secondary | ICD-10-CM | POA: Diagnosis not present

## 2020-04-15 DIAGNOSIS — D649 Anemia, unspecified: Secondary | ICD-10-CM | POA: Diagnosis not present

## 2020-04-18 DIAGNOSIS — M1711 Unilateral primary osteoarthritis, right knee: Secondary | ICD-10-CM | POA: Diagnosis not present

## 2020-04-30 DIAGNOSIS — D649 Anemia, unspecified: Secondary | ICD-10-CM | POA: Diagnosis not present

## 2020-04-30 DIAGNOSIS — R634 Abnormal weight loss: Secondary | ICD-10-CM | POA: Diagnosis not present

## 2020-05-03 ENCOUNTER — Other Ambulatory Visit: Payer: Self-pay

## 2020-05-03 ENCOUNTER — Encounter: Payer: Self-pay | Admitting: Podiatry

## 2020-05-03 ENCOUNTER — Ambulatory Visit: Payer: Medicare Other | Admitting: Podiatry

## 2020-05-03 DIAGNOSIS — M79675 Pain in left toe(s): Secondary | ICD-10-CM

## 2020-05-03 DIAGNOSIS — B351 Tinea unguium: Secondary | ICD-10-CM

## 2020-05-03 DIAGNOSIS — E119 Type 2 diabetes mellitus without complications: Secondary | ICD-10-CM

## 2020-05-03 DIAGNOSIS — M79674 Pain in right toe(s): Secondary | ICD-10-CM | POA: Diagnosis not present

## 2020-05-03 NOTE — Progress Notes (Signed)
This patient returns to my office for at risk foot care.  This patient requires this care by a professional since this patient will be at risk due to having type 1 diabetes. He presents to the office with his daughter..  This patient is unable to cut nails himself since the patient cannot reach his nails.These nails are painful walking and wearing shoes.  This patient presents for at risk foot care today.  General Appearance  Alert, conversant and in no acute stress.  Vascular  Dorsalis pedis and posterior tibial  pulses are palpable  Right.  Weakly palpable pulses left foot..  Capillary return is within normal limits  bilaterally. Temperature is within normal limits  bilaterally.  Neurologic  Senn-Weinstein monofilament wire test within normal limits  bilaterally. Muscle power within normal limits bilaterally.  Nails Thick disfigured discolored nails with subungual debris  from hallux to fifth toes bilaterally. No evidence of bacterial infection or drainage bilaterally.  Orthopedic  No limitations of motion  feet .  No crepitus or effusions noted.  No bony pathology or digital deformities noted.  Skin  normotropic skin with no porokeratosis noted bilaterally.  No signs of infections or ulcers noted.     Onychomycosis  Pain in right toes  Pain in left toes  Consent was obtained for treatment procedures.   Mechanical debridement of nails 1-5  bilaterally performed with a nail nipper.  Filed with dremel without incident. Diabetic foot exam performed.   Return office visit 3 months                     Told patient to return for periodic foot care and evaluation due to potential at risk complications.   Helane Gunther DPM

## 2020-05-22 DIAGNOSIS — E1169 Type 2 diabetes mellitus with other specified complication: Secondary | ICD-10-CM | POA: Diagnosis not present

## 2020-05-22 DIAGNOSIS — E7800 Pure hypercholesterolemia, unspecified: Secondary | ICD-10-CM | POA: Diagnosis not present

## 2020-05-22 DIAGNOSIS — Z23 Encounter for immunization: Secondary | ICD-10-CM | POA: Diagnosis not present

## 2020-05-22 DIAGNOSIS — Z0000 Encounter for general adult medical examination without abnormal findings: Secondary | ICD-10-CM | POA: Diagnosis not present

## 2020-05-22 DIAGNOSIS — Z7984 Long term (current) use of oral hypoglycemic drugs: Secondary | ICD-10-CM | POA: Diagnosis not present

## 2020-05-22 DIAGNOSIS — G20 Parkinson's disease: Secondary | ICD-10-CM | POA: Diagnosis not present

## 2020-05-22 DIAGNOSIS — I10 Essential (primary) hypertension: Secondary | ICD-10-CM | POA: Diagnosis not present

## 2020-07-23 ENCOUNTER — Other Ambulatory Visit: Payer: Self-pay | Admitting: Neurology

## 2020-07-30 DIAGNOSIS — E119 Type 2 diabetes mellitus without complications: Secondary | ICD-10-CM | POA: Diagnosis not present

## 2020-07-30 DIAGNOSIS — E1169 Type 2 diabetes mellitus with other specified complication: Secondary | ICD-10-CM | POA: Diagnosis not present

## 2020-07-30 DIAGNOSIS — I10 Essential (primary) hypertension: Secondary | ICD-10-CM | POA: Diagnosis not present

## 2020-07-30 DIAGNOSIS — E782 Mixed hyperlipidemia: Secondary | ICD-10-CM | POA: Diagnosis not present

## 2020-07-30 DIAGNOSIS — E1165 Type 2 diabetes mellitus with hyperglycemia: Secondary | ICD-10-CM | POA: Diagnosis not present

## 2020-07-30 DIAGNOSIS — G4700 Insomnia, unspecified: Secondary | ICD-10-CM | POA: Diagnosis not present

## 2020-07-30 DIAGNOSIS — E11649 Type 2 diabetes mellitus with hypoglycemia without coma: Secondary | ICD-10-CM | POA: Diagnosis not present

## 2020-07-30 DIAGNOSIS — G20 Parkinson's disease: Secondary | ICD-10-CM | POA: Diagnosis not present

## 2020-08-06 NOTE — Progress Notes (Signed)
Assessment/Plan:   1.  Parkinsons Disease  -Carbidopa/levodopa 25/100, 1 tablet 3 times per day  -discussed PT - pt is homebound - pt/son agreeable as long as affordable  2.  Dementia  -Had neurocognitive testing in June, 2021 with evidence of dementia, both parkinsonism and Alzheimer's.  -Discussed again that he should not be driving.  -Increase donepezil, 10 mg daily.  -sleep schedule continues to be an issue.  Son works at night and comes home middle of the night and pt wants to get up and eat then and then sleeps some in day.    3.  B12 deficiency  -Continue over-the-counter B12  Subjective:   Christian Morgan was seen today in follow up for parkinsonism.  My previous records were reviewed prior to todays visit as well as outside records available to me.  Patient is with his son who supplements the history.  They admit that when patient sleep/wake cycles are proper, he functions much better.  Son works at night and comes home in the middle of the night and patient wants to wake up and interact with his son and eat with him in the middle of the night, which has been problematic.  Pt still wants to sleep in the day and keep sons sleep schedule.  Pt denies falls.  Pt denies lightheadedness, near syncope.  No hallucinations.  Mood has been good.  Occasional walking for exercise.  Started donepezil last visit.  He is tolerating it well.  Discussed importance last visit of no driving.  Report that he is not driving.    Current prescribed movement disorder medications: Carbidopa/levodopa 25/100, 1 tablet 3 times per day (taking it variable times of day - seems like 10am/3pm/8pm) Donepezil, 5 mg daily (started last visit) - tolerating it B12 supplement  PREVIOUS MEDICATIONS: Sinemet  ALLERGIES:   Allergies  Allergen Reactions   Lipitor [Atorvastatin] Other (See Comments)    Increased liver enzymes and joint pain from numerous statins   Penicillins Hives and Rash    Did it involve  swelling of the face/tongue/throat, SOB, or low BP? No Did it involve sudden or severe rash/hives, skin peeling, or any reaction on the inside of your mouth or nose? No Did you need to seek medical attention at a hospital or doctor's office? yes When did it last happen?       30 years If all above answers are "NO", may proceed with cephalosporin use.   Amitriptyline Hcl     Unknown reaction   Cyclobenzaprine     Burning in stomach   Gabapentin     Dizziness    Other Other (See Comments)    [place]: "Terrible migraines."   Zetia [Ezetimibe]     Joint pain   Aspirin Other (See Comments)    Upset stomach   Doxycycline Hives    Reaction took 4-5 days   Iodinated Diagnostic Agents Hives    Developed hives after contrast "50 years ago when I had a subdural hematoma" at age 33.  Tolerates now with Benadryl 50mg  PO one hour before contrast.   Levofloxacin Hives    Delayed allergic reaction    CURRENT MEDICATIONS:  Outpatient Encounter Medications as of 08/12/2020  Medication Sig   acetaminophen (TYLENOL) 500 MG tablet Take 500 mg by mouth every 6 (six) hours as needed for moderate pain or headache.   betamethasone dipropionate 0.05 % cream Apply 1 application topically 2 (two) times daily.   carbidopa-levodopa (SINEMET IR) 25-100  MG tablet TAKE 1 TABLET BY MOUTH THREE TIMES A DAY   donepezil (ARICEPT) 5 MG tablet TAKE 1 TABLET BY MOUTH EVERYDAY AT BEDTIME   escitalopram (LEXAPRO) 10 MG tablet Take 5 mg by mouth daily.    levocetirizine (XYZAL) 5 MG tablet Take 5 mg by mouth every evening.   Light Mineral Oil-Mineral Oil (RETAINE MGD OP) Place 1 drop into the right eye daily as needed (watery eye).   loratadine (CLARITIN) 10 MG tablet Take 10 mg by mouth daily.   losartan (COZAAR) 100 MG tablet Take 100 mg by mouth daily.   metFORMIN (GLUCOPHAGE-XR) 500 MG 24 hr tablet Take 1,000 mg by mouth 2 (two) times daily.   rosuvastatin (CRESTOR) 5 MG tablet Take 5 mg by mouth daily.   sildenafil  (REVATIO) 20 MG tablet 1-5   Turmeric 500 MG TABS 2 tablets   vitamin B-12 (CYANOCOBALAMIN) 1000 MCG tablet Take 1,000 mcg by mouth daily.   traMADol (ULTRAM) 50 MG tablet Take 50 mg by mouth every 6 (six) hours as needed. (Patient not taking: Reported on 08/12/2020)   triamcinolone acetonide (KENALOG-40) 40 MG/ML injection Inject 2 mg into the muscle once. (Patient not taking: Reported on 08/12/2020)   No facility-administered encounter medications on file as of 08/12/2020.    Objective:   PHYSICAL EXAMINATION:    VITALS:   Vitals:   08/12/20 0809  BP: 130/68  Pulse: 74  SpO2: 97%  Weight: 206 lb (93.4 kg)  Height: 5\' 6"  (1.676 m)    GEN:  The patient appears stated age and is in NAD. HEENT:  Normocephalic, atraumatic.  The mucous membranes are moist. The superficial temporal arteries are without ropiness or tenderness. CV:  RRR Lungs:  CTAB Neck/HEME:  There are no carotid bruits bilaterally.  Neurological examination:  Orientation: The patient is alert and oriented x2. Cranial nerves: There is good facial symmetry with mild facial hypomimia. The speech is fluent and clear. Soft palate rises symmetrically and there is no tongue deviation. Hearing is intact to conversational tone. Sensation: Sensation is intact to light touch throughout Motor: Strength is at least antigravity x4.  Movement examination: Tone: There is minimal increased tone in the RUE.  Tone is normal in the LUE/LLE Abnormal movements: there is rare RUE rest tremor Coordination:  There is no significant decremation with rams Gait and Station: The patient has no difficulty arising out of a deep-seated chair without the use of the hands. The patient's stride length is decreased with RUE rest tremor.  He shuffles a little bit  I have reviewed and interpreted the following labs independently    Chemistry      Component Value Date/Time   NA 139 01/11/2019 0649   K 4.0 01/11/2019 0649   CL 106 01/11/2019 0649    CO2 24 01/11/2019 0649   BUN 23 01/11/2019 0649   CREATININE 1.06 01/11/2019 0649      Component Value Date/Time   CALCIUM 9.2 01/11/2019 0649       Lab Results  Component Value Date   WBC 7.2 01/11/2019   HGB 12.3 (L) 01/11/2019   HCT 37.9 (L) 01/11/2019   MCV 94.3 01/11/2019   PLT 257 01/11/2019    No results found for: TSH   Total time spent on today's visit was 21 minutes, including both face-to-face time and nonface-to-face time.  Time included that spent on review of records (prior notes available to me/labs/imaging if pertinent), discussing treatment and goals, answering patient's questions and  coordinating care.  Cc:  [name], MD

## 2020-08-07 ENCOUNTER — Encounter: Payer: Self-pay | Admitting: Podiatry

## 2020-08-07 ENCOUNTER — Ambulatory Visit: Payer: Medicare Other | Admitting: Podiatry

## 2020-08-07 ENCOUNTER — Other Ambulatory Visit: Payer: Self-pay

## 2020-08-07 DIAGNOSIS — I359 Nonrheumatic aortic valve disorder, unspecified: Secondary | ICD-10-CM | POA: Insufficient documentation

## 2020-08-07 DIAGNOSIS — L209 Atopic dermatitis, unspecified: Secondary | ICD-10-CM | POA: Insufficient documentation

## 2020-08-07 DIAGNOSIS — E119 Type 2 diabetes mellitus without complications: Secondary | ICD-10-CM | POA: Diagnosis not present

## 2020-08-07 DIAGNOSIS — E663 Overweight: Secondary | ICD-10-CM | POA: Insufficient documentation

## 2020-08-07 DIAGNOSIS — G20 Parkinson's disease: Secondary | ICD-10-CM | POA: Insufficient documentation

## 2020-08-07 DIAGNOSIS — M79674 Pain in right toe(s): Secondary | ICD-10-CM

## 2020-08-07 DIAGNOSIS — M79675 Pain in left toe(s): Secondary | ICD-10-CM | POA: Diagnosis not present

## 2020-08-07 DIAGNOSIS — G4700 Insomnia, unspecified: Secondary | ICD-10-CM | POA: Insufficient documentation

## 2020-08-07 DIAGNOSIS — F419 Anxiety disorder, unspecified: Secondary | ICD-10-CM | POA: Insufficient documentation

## 2020-08-07 DIAGNOSIS — E7800 Pure hypercholesterolemia, unspecified: Secondary | ICD-10-CM | POA: Insufficient documentation

## 2020-08-07 DIAGNOSIS — F0390 Unspecified dementia without behavioral disturbance: Secondary | ICD-10-CM | POA: Insufficient documentation

## 2020-08-07 DIAGNOSIS — N200 Calculus of kidney: Secondary | ICD-10-CM | POA: Insufficient documentation

## 2020-08-07 DIAGNOSIS — B0229 Other postherpetic nervous system involvement: Secondary | ICD-10-CM | POA: Insufficient documentation

## 2020-08-07 DIAGNOSIS — B351 Tinea unguium: Secondary | ICD-10-CM

## 2020-08-07 DIAGNOSIS — G20A1 Parkinson's disease without dyskinesia, without mention of fluctuations: Secondary | ICD-10-CM | POA: Insufficient documentation

## 2020-08-07 DIAGNOSIS — N529 Male erectile dysfunction, unspecified: Secondary | ICD-10-CM | POA: Insufficient documentation

## 2020-08-07 DIAGNOSIS — F3340 Major depressive disorder, recurrent, in remission, unspecified: Secondary | ICD-10-CM | POA: Insufficient documentation

## 2020-08-07 DIAGNOSIS — K5900 Constipation, unspecified: Secondary | ICD-10-CM | POA: Insufficient documentation

## 2020-08-07 DIAGNOSIS — R413 Other amnesia: Secondary | ICD-10-CM | POA: Insufficient documentation

## 2020-08-07 DIAGNOSIS — F331 Major depressive disorder, recurrent, moderate: Secondary | ICD-10-CM | POA: Insufficient documentation

## 2020-08-07 DIAGNOSIS — N400 Enlarged prostate without lower urinary tract symptoms: Secondary | ICD-10-CM | POA: Insufficient documentation

## 2020-08-07 NOTE — Progress Notes (Signed)
This patient returns to my office for at risk foot care.  This patient requires this care by a professional since this patient will be at risk due to having type 1 diabetes. He presents to the office with his daughter..  This patient is unable to cut nails himself since the patient cannot reach his nails.These nails are painful walking and wearing shoes.  This patient presents for at risk foot care today.  General Appearance  Alert, conversant and in no acute stress.  Vascular  Dorsalis pedis and posterior tibial  pulses are palpable  Right.  Weakly palpable pulses left foot..  Capillary return is within normal limits  bilaterally. Temperature is within normal limits  bilaterally.  Neurologic  Senn-Weinstein monofilament wire test within normal limits  bilaterally. Muscle power within normal limits bilaterally.  Nails Thick disfigured discolored nails with subungual debris  from hallux to fifth toes bilaterally. No evidence of bacterial infection or drainage bilaterally.  Orthopedic  No limitations of motion  feet .  No crepitus or effusions noted.  No bony pathology or digital deformities noted.  Skin  normotropic skin with no porokeratosis noted bilaterally.  No signs of infections or ulcers noted.     Onychomycosis  Pain in right toes  Pain in left toes  Consent was obtained for treatment procedures.   Mechanical debridement of nails 1-5  bilaterally performed with a nail nipper.  Filed with dremel without incident. Diabetic foot exam performed.   Return office visit 3 months                     Told patient to return for periodic foot care and evaluation due to potential at risk complications.   Helane Gunther DPM

## 2020-08-12 ENCOUNTER — Encounter: Payer: Self-pay | Admitting: Neurology

## 2020-08-12 ENCOUNTER — Other Ambulatory Visit: Payer: Self-pay

## 2020-08-12 ENCOUNTER — Ambulatory Visit: Payer: Medicare Other | Admitting: Neurology

## 2020-08-12 VITALS — BP 130/68 | HR 74 | Ht 66.0 in | Wt 206.0 lb

## 2020-08-12 DIAGNOSIS — G20 Parkinson's disease: Secondary | ICD-10-CM

## 2020-08-12 DIAGNOSIS — F0390 Unspecified dementia without behavioral disturbance: Secondary | ICD-10-CM | POA: Diagnosis not present

## 2020-08-12 MED ORDER — CARBIDOPA-LEVODOPA 25-100 MG PO TABS: 1.0000 | ORAL_TABLET | Freq: Three times a day (TID) | ORAL | 1 refills | Status: DC

## 2020-08-12 MED ORDER — DONEPEZIL HCL 10 MG PO TABS: 10.0000 mg | ORAL_TABLET | Freq: Every day | ORAL | 3 refills | Status: DC

## 2020-08-12 NOTE — Patient Instructions (Addendum)
Continue carbidopa/levodopa 25/100 three times per day Increase donepezil to 10 mg daily (you can take two of the 5 mg until you run out) We will have PT call you to set up home PT

## 2020-08-15 ENCOUNTER — Other Ambulatory Visit: Payer: Self-pay | Admitting: Neurology

## 2020-08-15 DIAGNOSIS — M25512 Pain in left shoulder: Secondary | ICD-10-CM | POA: Diagnosis not present

## 2020-09-13 ENCOUNTER — Telehealth: Payer: Self-pay | Admitting: Neurology

## 2020-09-13 DIAGNOSIS — R4182 Altered mental status, unspecified: Secondary | ICD-10-CM

## 2020-09-13 NOTE — Telephone Encounter (Signed)
Patient's son called to report the patient had an event yesterday where he could not remember who was around him. He didn't remember for a short while. EMS was called and called her son at work.  Patient is okay today and he was okay shortly after the incident. Patient had just woken up from a nap. Patient declined to go to ED.

## 2020-09-14 NOTE — Telephone Encounter (Signed)
Glad they contacted EMS but its unfortunate that they declined ED.  Difficult to say what this was, especially given his fairly significant baseline memory issues.  Lets go ahead and do CT brain without just to make sure nothing acute/new.  Dx:  mental status change.

## 2020-09-16 NOTE — Telephone Encounter (Signed)
Pt son called in and would like a call back. He still needs answers and would like Tat to call them back. [phone] or after 330pm deliha [phone]

## 2020-09-17 NOTE — Telephone Encounter (Signed)
Called and spoke to patients son Christian Morgan. Informed him that [name] was glad that they contacted EMS but its unfortunate that they declined ED.  Informed Christian Morgan that [name] stated it was difficult to say what this was, especially given his fairly significant baseline memory issues. Informed Christian Morgan that [name] would like to order a CT brain without just to make sure nothing acute/new. Patients son is ok with CT being ordered and was informed that [name] will do the PA and once approved they will contact him to schedule patients CT.  Patients son verbalized understanding and had no further questions or concerns.

## 2020-09-18 DIAGNOSIS — I10 Essential (primary) hypertension: Secondary | ICD-10-CM | POA: Diagnosis not present

## 2020-09-18 DIAGNOSIS — E1169 Type 2 diabetes mellitus with other specified complication: Secondary | ICD-10-CM | POA: Diagnosis not present

## 2020-09-18 DIAGNOSIS — G20 Parkinson's disease: Secondary | ICD-10-CM | POA: Diagnosis not present

## 2020-09-18 DIAGNOSIS — Z7984 Long term (current) use of oral hypoglycemic drugs: Secondary | ICD-10-CM | POA: Diagnosis not present

## 2020-09-18 DIAGNOSIS — E7800 Pure hypercholesterolemia, unspecified: Secondary | ICD-10-CM | POA: Diagnosis not present

## 2020-09-19 ENCOUNTER — Other Ambulatory Visit: Payer: Self-pay

## 2020-09-19 ENCOUNTER — Ambulatory Visit
Admission: RE | Admit: 2020-09-19 | Discharge: 2020-09-19 | Disposition: A | Discharge status: Home / Self Care | Payer: Medicare Other | Source: Ambulatory Visit | Attending: Neurology | Admitting: Neurology

## 2020-09-19 DIAGNOSIS — R4182 Altered mental status, unspecified: Secondary | ICD-10-CM

## 2020-09-19 DIAGNOSIS — R410 Disorientation, unspecified: Secondary | ICD-10-CM | POA: Diagnosis not present

## 2020-09-20 ENCOUNTER — Telehealth: Payer: Self-pay | Admitting: Neurology

## 2020-09-20 NOTE — Telephone Encounter (Signed)
Pt's daughter called in to get results of CT Scan

## 2020-09-23 NOTE — Telephone Encounter (Signed)
Ct Results 09/19/20: Let pt/son know that CT brain is good/stable.  No new changes

## 2020-09-23 NOTE — Telephone Encounter (Signed)
Spoke to patients daughter and informed her of CT resuults and Patients daughter asked if [name] wanted them to do anything? Patients daughter stated that patient is acting normal now. Patients daughter states that patient had that weird incident where he did not remember anyone  He did not remember he had children,  didn't remember his grand children, or anything that happened in life. Patients daughter stated that when they called EMTs they checked him and said he was ok and said the hospital wouldn't do anything. Patients daughter also stated that patient sees his son everyday and he could not remember his sons name and was shown pictures and could not remember anyone in photographs.  Informed patients daughter that I would pass this info along to [name] and give her a call back once I hear back.

## 2020-09-23 NOTE — Telephone Encounter (Signed)
Could have been a TGA (transient global amnesia) but difficult to say given patients profound baseline memory issues.  Would not do anything further right now since imaging ok.

## 2020-09-24 NOTE — Telephone Encounter (Signed)
Called patients daughter  and informed her per [name] that patient Could have had a TGA (transient global amnesia) but difficult to say given patients profound baseline memory issues.  Informed patients daughter that [name] would not do anything further right now since imaging ok.  Patients daughter verbalized understanding and thanked [place] for the call and had no further questions or concerns.

## 2020-10-23 DIAGNOSIS — J309 Allergic rhinitis, unspecified: Secondary | ICD-10-CM | POA: Diagnosis not present

## 2020-10-23 DIAGNOSIS — J3089 Other allergic rhinitis: Secondary | ICD-10-CM | POA: Diagnosis not present

## 2020-10-30 DIAGNOSIS — E1165 Type 2 diabetes mellitus with hyperglycemia: Secondary | ICD-10-CM | POA: Diagnosis not present

## 2020-10-30 DIAGNOSIS — E119 Type 2 diabetes mellitus without complications: Secondary | ICD-10-CM | POA: Diagnosis not present

## 2020-10-30 DIAGNOSIS — G20 Parkinson's disease: Secondary | ICD-10-CM | POA: Diagnosis not present

## 2020-10-30 DIAGNOSIS — E7800 Pure hypercholesterolemia, unspecified: Secondary | ICD-10-CM | POA: Diagnosis not present

## 2020-10-30 DIAGNOSIS — G4700 Insomnia, unspecified: Secondary | ICD-10-CM | POA: Diagnosis not present

## 2020-10-30 DIAGNOSIS — E782 Mixed hyperlipidemia: Secondary | ICD-10-CM | POA: Diagnosis not present

## 2020-10-30 DIAGNOSIS — I10 Essential (primary) hypertension: Secondary | ICD-10-CM | POA: Diagnosis not present

## 2020-10-30 DIAGNOSIS — E11649 Type 2 diabetes mellitus with hypoglycemia without coma: Secondary | ICD-10-CM | POA: Diagnosis not present

## 2020-11-07 DIAGNOSIS — R5383 Other fatigue: Secondary | ICD-10-CM | POA: Diagnosis not present

## 2020-11-08 ENCOUNTER — Ambulatory Visit: Payer: Medicare Other | Admitting: Podiatry

## 2020-11-08 ENCOUNTER — Encounter: Payer: Self-pay | Admitting: Podiatry

## 2020-11-08 ENCOUNTER — Other Ambulatory Visit: Payer: Self-pay

## 2020-11-08 DIAGNOSIS — E119 Type 2 diabetes mellitus without complications: Secondary | ICD-10-CM

## 2020-11-08 DIAGNOSIS — M79675 Pain in left toe(s): Secondary | ICD-10-CM

## 2020-11-08 DIAGNOSIS — M79674 Pain in right toe(s): Secondary | ICD-10-CM | POA: Diagnosis not present

## 2020-11-08 DIAGNOSIS — B351 Tinea unguium: Secondary | ICD-10-CM | POA: Diagnosis not present

## 2020-11-08 NOTE — Progress Notes (Signed)
This patient returns to my office for at risk foot care.  This patient requires this care by a professional since this patient will be at risk due to having type 1 diabetes. He presents to the office with his daughter..  This patient is unable to cut nails himself since the patient cannot reach his nails.These nails are painful walking and wearing shoes.  This patient presents for at risk foot care today.  General Appearance  Alert, conversant and in no acute stress.  Vascular  Dorsalis pedis and posterior tibial  pulses are palpable  Right.  Weakly palpable pulses left foot..  Capillary return is within normal limits  bilaterally. Temperature is within normal limits  bilaterally.  Neurologic  Senn-Weinstein monofilament wire test within normal limits  bilaterally. Muscle power within normal limits bilaterally.  Nails Thick disfigured discolored nails with subungual debris  from hallux to fifth toes bilaterally. No evidence of bacterial infection or drainage bilaterally.  Orthopedic  No limitations of motion  feet .  No crepitus or effusions noted.  No bony pathology or digital deformities noted.  Skin  normotropic skin with no porokeratosis noted bilaterally.  No signs of infections or ulcers noted.     Onychomycosis  Pain in right toes  Pain in left toes  Consent was obtained for treatment procedures.   Mechanical debridement of nails 1-5  bilaterally performed with a nail nipper.  Filed with dremel without incident.    Return office visit 3 months                     Told patient to return for periodic foot care and evaluation due to potential at risk complications.   Helane Gunther DPM

## 2020-11-11 ENCOUNTER — Other Ambulatory Visit: Payer: Self-pay | Admitting: Neurology

## 2020-11-11 DIAGNOSIS — G20 Parkinson's disease: Secondary | ICD-10-CM

## 2020-11-22 DIAGNOSIS — J302 Other seasonal allergic rhinitis: Secondary | ICD-10-CM | POA: Diagnosis not present

## 2020-12-06 DIAGNOSIS — G20 Parkinson's disease: Secondary | ICD-10-CM | POA: Diagnosis not present

## 2020-12-06 DIAGNOSIS — E1169 Type 2 diabetes mellitus with other specified complication: Secondary | ICD-10-CM | POA: Diagnosis not present

## 2020-12-06 DIAGNOSIS — E11649 Type 2 diabetes mellitus with hypoglycemia without coma: Secondary | ICD-10-CM | POA: Diagnosis not present

## 2020-12-06 DIAGNOSIS — E119 Type 2 diabetes mellitus without complications: Secondary | ICD-10-CM | POA: Diagnosis not present

## 2020-12-06 DIAGNOSIS — I10 Essential (primary) hypertension: Secondary | ICD-10-CM | POA: Diagnosis not present

## 2020-12-06 DIAGNOSIS — E1165 Type 2 diabetes mellitus with hyperglycemia: Secondary | ICD-10-CM | POA: Diagnosis not present

## 2020-12-06 DIAGNOSIS — E782 Mixed hyperlipidemia: Secondary | ICD-10-CM | POA: Diagnosis not present

## 2020-12-06 DIAGNOSIS — G4700 Insomnia, unspecified: Secondary | ICD-10-CM | POA: Diagnosis not present

## 2020-12-12 ENCOUNTER — Telehealth: Payer: Self-pay | Admitting: Neurology

## 2020-12-12 NOTE — Telephone Encounter (Signed)
Patient's son, Javid, called and said the patient is needing something like a letter stating that the patient cannot drive and the reasons as to why for the patient.  He'd like the letter addressed to him directly so he can review it with the patient.   He said the patient has forgotten that he's been told not to drive by [name].   Attn: Deidre Ala, II

## 2020-12-12 NOTE — Telephone Encounter (Signed)
Helped pt son set up mychart and he printed off office not from 08/12/20

## 2020-12-23 DIAGNOSIS — H04123 Dry eye syndrome of bilateral lacrimal glands: Secondary | ICD-10-CM | POA: Diagnosis not present

## 2020-12-23 DIAGNOSIS — I10 Essential (primary) hypertension: Secondary | ICD-10-CM | POA: Diagnosis not present

## 2020-12-23 DIAGNOSIS — E119 Type 2 diabetes mellitus without complications: Secondary | ICD-10-CM | POA: Diagnosis not present

## 2021-01-13 DIAGNOSIS — I10 Essential (primary) hypertension: Secondary | ICD-10-CM | POA: Diagnosis not present

## 2021-01-13 DIAGNOSIS — Z7984 Long term (current) use of oral hypoglycemic drugs: Secondary | ICD-10-CM | POA: Diagnosis not present

## 2021-01-13 DIAGNOSIS — Z23 Encounter for immunization: Secondary | ICD-10-CM | POA: Diagnosis not present

## 2021-01-13 DIAGNOSIS — E7800 Pure hypercholesterolemia, unspecified: Secondary | ICD-10-CM | POA: Diagnosis not present

## 2021-01-13 DIAGNOSIS — E1169 Type 2 diabetes mellitus with other specified complication: Secondary | ICD-10-CM | POA: Diagnosis not present

## 2021-02-09 ENCOUNTER — Other Ambulatory Visit: Payer: Self-pay | Admitting: Neurology

## 2021-02-10 ENCOUNTER — Other Ambulatory Visit: Payer: Self-pay

## 2021-02-10 ENCOUNTER — Ambulatory Visit: Payer: Medicare Other | Admitting: Podiatry

## 2021-02-10 DIAGNOSIS — M79674 Pain in right toe(s): Secondary | ICD-10-CM

## 2021-02-10 DIAGNOSIS — B351 Tinea unguium: Secondary | ICD-10-CM | POA: Diagnosis not present

## 2021-02-10 DIAGNOSIS — M79675 Pain in left toe(s): Secondary | ICD-10-CM | POA: Diagnosis not present

## 2021-02-10 DIAGNOSIS — E119 Type 2 diabetes mellitus without complications: Secondary | ICD-10-CM

## 2021-02-10 NOTE — Progress Notes (Signed)
This patient returns to my office for at risk foot care.  This patient requires this care by a professional since this patient will be at risk due to having type 1 diabetes. He presents to the office with his daughter..  This patient is unable to cut nails himself since the patient cannot reach his nails.These nails are painful walking and wearing shoes.  This patient presents for at risk foot care today.  General Appearance  Alert, conversant and in no acute stress.  Vascular  Dorsalis pedis and posterior tibial  pulses are palpable  Right.  Weakly palpable pulses left foot..  Capillary return is within normal limits  bilaterally. Temperature is within normal limits  bilaterally.  Neurologic  Senn-Weinstein monofilament wire test within normal limits  bilaterally. Muscle power within normal limits bilaterally.  Nails Thick disfigured discolored nails with subungual debris  from hallux to fifth toes bilaterally. No evidence of bacterial infection or drainage bilaterally.  Orthopedic  No limitations of motion  feet .  No crepitus or effusions noted.  No bony pathology or digital deformities noted.  Skin  normotropic skin with no porokeratosis noted bilaterally.  No signs of infections or ulcers noted.     Onychomycosis  Pain in right toes  Pain in left toes  Consent was obtained for treatment procedures.   Mechanical debridement of nails 1-5  bilaterally performed with a nail nipper.  Filed with dremel without incident.    Return office visit 3 months                     Told patient to return for periodic foot care and evaluation due to potential at risk complications.   Helane Gunther DPM

## 2021-02-17 NOTE — Progress Notes (Signed)
Assessment/Plan:   1.  Parkinsons Disease  -Continue carbidopa/levodopa 25/100, 1 tablet 3 times per day.  Levodopa is taking at strange times a day because of son's work schedule.   2.  Dementia  -Had neurocognitive testing in June, 2021 with evidence of dementia, both parkinsonism and Alzheimer's.  -Discussed again that he should not be driving.  -Continue donepezil, 10 mg daily.  -sleep schedule continues to be an issue.  Son works at night and comes home middle of the night and pt wants to get up and eat then and then sleeps some in day.    3.  B12 deficiency  -Continue over-the-counter B12  4.  ? TGA  -Patient had an episode in July, 2022 where he had trouble remembering the people around him.  Patient does have baseline dementia, so difficult exactly to say what this was.  CT brain was negative.  Subjective:   Christian Morgan was seen today in follow up for parkinsonism.  My previous records were reviewed prior to todays visit as well as outside records available to me.  Patient is with his son who supplements the history.  Patients son called me in July, not long after I saw them, and stated that they had an incident where the patient could not remember who was around him.  They called EMS but declined going to the emergency room.  They subsequently called here and asked me what to do.  Told them that it was difficult to say what it was, given the patient's significant baseline memory issues.  We ended up doing a CT of the brain that was nonacute.  He has had no further spells.  Regarding falls, pt was trying to climb a chair to get peanut butter from high shelf and fell on butt trying to get on chair.  Didn't get hurt.  Was only fall.  Started coconut oil for tx of memory per son.  Son states that tremor is worse when he is at our office - not bad at home.    Current prescribed movement disorder medications: Carbidopa/levodopa 25/100, 1 tablet 3 times per day  Donepezil,  10 mg daily (increased last visit) B12 supplement  PREVIOUS MEDICATIONS: Sinemet  ALLERGIES:   Allergies  Allergen Reactions   Lipitor [Atorvastatin] Other (See Comments)    Increased liver enzymes and joint pain from numerous statins   Penicillins Hives and Rash    Did it involve swelling of the face/tongue/throat, SOB, or low BP? No Did it involve sudden or severe rash/hives, skin peeling, or any reaction on the inside of your mouth or nose? No Did you need to seek medical attention at a hospital or doctor's office? yes When did it last happen?       30 years If all above answers are NO, may proceed with cephalosporin use.   Amitriptyline Hcl     Unknown reaction   Cyclobenzaprine     Burning in stomach   Gabapentin     Dizziness    Other Other (See Comments)    [place]: "Terrible migraines."   Zetia [Ezetimibe]     Joint pain   Aspirin Other (See Comments)    Upset stomach   Doxycycline Hives    Reaction took 4-5 days   Iodinated Diagnostic Agents Hives    Developed hives after contrast "50 years ago when I had a subdural hematoma" at age 37.  Tolerates now with Benadryl 50mg  PO one hour before contrast.  Levofloxacin Hives    Delayed allergic reaction    CURRENT MEDICATIONS:  Outpatient Encounter Medications as of 02/18/2021  Medication Sig   carbidopa-levodopa (SINEMET IR) 25-100 MG tablet TAKE 1 TABLET BY MOUTH THREE TIMES A DAY   Coconut Oil 1000 MG CAPS Take by mouth.   donepezil (ARICEPT) 10 MG tablet TAKE 1 TABLET BY MOUTH EVERYDAY AT BEDTIME   escitalopram (LEXAPRO) 10 MG tablet Take 5 mg by mouth daily.    levocetirizine (XYZAL) 5 MG tablet Take 5 mg by mouth every evening.   Light Mineral Oil-Mineral Oil (RETAINE MGD OP) Place 1 drop into the right eye daily as needed (watery eye).   loratadine (CLARITIN) 10 MG tablet Take 10 mg by mouth daily.   losartan (COZAAR) 100 MG tablet Take 100 mg by mouth daily.   metFORMIN (GLUCOPHAGE-XR) 500 MG 24 hr tablet  Take 1,000 mg by mouth 2 (two) times daily.   rosuvastatin (CRESTOR) 5 MG tablet Take 5 mg by mouth daily.   sildenafil (REVATIO) 20 MG tablet 1-5   Turmeric 500 MG TABS 2 tablets   vitamin B-12 (CYANOCOBALAMIN) 1000 MCG tablet Take 1,000 mcg by mouth daily.   acetaminophen (TYLENOL) 500 MG tablet Take 500 mg by mouth every 6 (six) hours as needed for moderate pain or headache.   betamethasone dipropionate 0.05 % cream Apply 1 application topically 2 (two) times daily.   triamcinolone acetonide (KENALOG-40) 40 MG/ML injection Inject 2 mg into the muscle once. (Patient not taking: Reported on 08/12/2020)   [DISCONTINUED] traMADol (ULTRAM) 50 MG tablet Take 50 mg by mouth every 6 (six) hours as needed. (Patient not taking: Reported on 08/12/2020)   No facility-administered encounter medications on file as of 02/18/2021.    Objective:   PHYSICAL EXAMINATION:    VITALS:   Vitals:   02/18/21 1436  BP: 136/82  Pulse: 65  SpO2: 98%  Weight: 225 lb 9.6 oz (102.3 kg)  Height: 5\' 6"  (1.676 m)     GEN:  The patient appears stated age and is in NAD. HEENT:  Normocephalic, atraumatic.  The mucous membranes are moist.   Neurological examination:  Orientation: The patient is alert and oriented x2. Cranial nerves: There is good facial symmetry with mild facial hypomimia. The speech is fluent and clear. Soft palate rises symmetrically and there is no tongue deviation. Hearing is intact to conversational tone. Sensation: Sensation is intact to light touch throughout Motor: Strength is at least antigravity x4.  Movement examination: Tone: There is minimal increased tone in the RUE.  Tone is normal in the LUE/LLE Abnormal movements: there is mild RUE rest tremor, worse with ambulation Coordination:  There is no significant decremation with rams Gait and Station: The patient pushes off to arise. The patient's stride length is decreased with RUE rest tremor.  He shuffles and is forward flexed  I  have reviewed and interpreted the following labs independently    Chemistry      Component Value Date/Time   NA 139 01/11/2019 0649   K 4.0 01/11/2019 0649   CL 106 01/11/2019 0649   CO2 24 01/11/2019 0649   BUN 23 01/11/2019 0649   CREATININE 1.06 01/11/2019 0649      Component Value Date/Time   CALCIUM 9.2 01/11/2019 0649       Lab Results  Component Value Date   WBC 7.2 01/11/2019   HGB 12.3 (L) 01/11/2019   HCT 37.9 (L) 01/11/2019   MCV 94.3 01/11/2019   PLT  257 01/11/2019    No results found for: TSH    Cc:  [name], MD

## 2021-02-18 ENCOUNTER — Other Ambulatory Visit: Payer: Self-pay

## 2021-02-18 ENCOUNTER — Ambulatory Visit: Payer: Medicare Other | Admitting: Neurology

## 2021-02-18 ENCOUNTER — Encounter: Payer: Self-pay | Admitting: Neurology

## 2021-02-18 VITALS — BP 136/82 | HR 65 | Ht 66.0 in | Wt 225.6 lb

## 2021-02-18 DIAGNOSIS — F0390 Unspecified dementia without behavioral disturbance: Secondary | ICD-10-CM

## 2021-02-18 DIAGNOSIS — G20 Parkinson's disease: Secondary | ICD-10-CM | POA: Diagnosis not present

## 2021-02-18 NOTE — Patient Instructions (Signed)
Parkinson's Disease °Parkinson's disease is a movement disorder. It is a long-term condition that gets worse over time. Each person with Parkinson's disease is affected differently. °This condition limits a person's ability to control movements and move the body normally. The condition can range from mild to severe. Parkinson's disease tends to get worse slowly over several years. °What are the causes? °Parkinson's disease is caused by a loss of brain cells (neurons) that make a brain chemical called dopamine. Dopamine is needed to control movement. As the condition gets worse, more neurons that make dopamine die. This makes it hard to move or control your movements. °The exact cause of the loss of neurons is not known. Genes and the environment may contribute to the cause of Parkinson's disease. °What increases the risk? °The following factors may make you more likely to develop this condition: °Being male. °Being age 60 or older. °Having a family history of Parkinson's disease. °Having had a traumatic brain injury. °Having been exposed to toxins, such as pesticides. °Having depression. °What are the signs or symptoms? °Symptoms of this condition can vary. The main symptoms are related to movement. These include: °A tremor or shaking while you are resting. You cannot control the shaking. °Stiffness in your arms and legs (rigidity). °Slowing of movement. You may lose facial expressions and have trouble making small movements that are needed to button clothing or brush your teeth. °An abnormal walk. You may walk with short, shuffling steps. °Loss of balance and stability when standing. You may sway, fall backward, and have trouble making turns. °Other symptoms include: °Mental or cognitive changes, including: °Depression or anxiety. °Having false beliefs (delusions). °Seeing, hearing, or feeling things that do not exist (hallucinations). °Trouble speaking or swallowing. °Changes in bowel or bladder functions,  including constipation, having to go urgently or frequently, or not being able to control your bowel or bladder. °Changes in sleep habits, acting out dreams, or trouble sleeping. °Depending on the severity of the symptoms, Parkinson's disease may be mild, moderate, or advanced. Parkinson's disease progression is different for everyone. Some people may not progress to the advanced stage. °Mild Parkinson's disease involves: °Movement problems that do not affect daily activities. °Movement problems on one side of the body. °Moderate Parkinson's disease involves: °Movement problems on both sides of the body. °Slowing of movement. °Coordination and balance problems. °Advanced Parkinson's disease involves: °Extreme difficulty walking. °Inability to live alone safely. °Signs of dementia, such as having trouble remembering things, doing daily tasks such as getting dressed, and problem solving. °How is this diagnosed? °This condition is diagnosed by a specialist. A diagnosis may be made based on symptoms, your medical history, and a physical exam. You may also have brain imaging tests to check for loss of neurons in the brain. °How is this treated? °There is no cure for Parkinson's disease. Treatment focuses on managing your symptoms. Treatment may include: °Medicines. Everyone responds to medicines differently. Your response may change over time. Work with your health care provider to find the best medicines for you. °Speech, occupational, and physical therapy. °Deep brain stimulation surgery to reduce tremors and other involuntary movements. °Follow these instructions at home: °Medicines °Take over-the-counter and prescription medicines only as told by your health care provider. °Avoid taking medicines that can affect thinking, such as pain or sleeping medicines. °Eating and drinking °Follow instructions from your health care provider about eating or drinking restrictions. °Do not drink alcohol. °Activity °Ask your health  care provider if it is safe for you   to drive. °Do exercises as told by your health care provider or physical therapist. °Lifestyle ° °Install grab bars and railings in your home to prevent falls. °Do not use any products that contain nicotine or tobacco. These products include cigarettes, chewing tobacco, and vaping devices, such as e-cigarettes. If you need help quitting, ask your health care provider. °Consider joining a support group for people with Parkinson's disease. °General instructions °Work with your health care provider to know the kind of day-to-day help that you may need and what to do to stay safe. °Keep all follow-up visits. This is important. Follow-up visits include any visits with a physical therapist, speech therapist, or occupational therapist. °Where to find more information °National Institute of Neurological Disorders and Stroke: www.ninds.nih.gov °Parkinson's Foundation: www.parkinson.org °Contact a health care provider if: °Medicines do not help your symptoms. °You are unsteady or have fallen at home. °You need more support to function well at home. °You have trouble swallowing. °You have severe constipation. °You are having problems with side effects from your medicines. °You feel confused, anxious, depressed, or have hallucinations. °Get help right away if you: °Are injured after a fall. °Cannot swallow without choking. °Have chest pain or trouble breathing. °Do not feel safe at home. °Have thoughts about hurting yourself or others. °These symptoms may represent a serious problem that is an emergency. Do not wait to see if the symptoms will go away. Get medical help right away. Call your local emergency services (911 in the [place] Do not drive yourself to the hospital. °If you ever feel like you may hurt yourself or others, or have thoughts about taking your own life, get help right away. Go to your nearest emergency department or: °Call your local emergency services (911 in the  [place] °Call a suicide crisis helpline, such as the National Suicide Prevention Lifeline at 1-800-273-8255 or 988 in the [place] This is open 24 hours a day in the [place] °Text the Crisis Text Line at 741741 (in the [place] °Summary °Parkinson's disease is a long-term condition that gets worse over time. This condition limits your ability to control your movements and move your body normally. °There is no cure for Parkinson's disease. Treatment focuses on managing your symptoms. °Work with your health care provider to know the kind of day-to-day help that you may need and what to do to stay safe. °Keep all follow-up visits, including any visits with a physical therapist, speech therapist, or occupational therapist. This is important. °This information is not intended to replace advice given to you by your health care provider. Make sure you discuss any questions you have with your health care provider. °Document Revised: 09/11/2020 Document Reviewed: 06/03/2020 °Elsevier Patient Education © 2022 Elsevier Inc. ° °

## 2021-04-10 DIAGNOSIS — U071 COVID-19: Secondary | ICD-10-CM | POA: Diagnosis not present

## 2021-04-10 DIAGNOSIS — R531 Weakness: Secondary | ICD-10-CM | POA: Diagnosis not present

## 2021-05-10 ENCOUNTER — Other Ambulatory Visit: Payer: Self-pay | Admitting: Neurology

## 2021-05-10 DIAGNOSIS — G20 Parkinson's disease: Secondary | ICD-10-CM

## 2021-05-12 ENCOUNTER — Other Ambulatory Visit: Payer: Self-pay

## 2021-05-14 ENCOUNTER — Other Ambulatory Visit: Payer: Self-pay | Admitting: Neurology

## 2021-05-14 DIAGNOSIS — G20 Parkinson's disease: Secondary | ICD-10-CM

## 2021-05-21 ENCOUNTER — Encounter: Payer: Self-pay | Admitting: Podiatry

## 2021-05-21 ENCOUNTER — Ambulatory Visit: Payer: Medicare Other | Admitting: Podiatry

## 2021-05-21 ENCOUNTER — Other Ambulatory Visit: Payer: Self-pay

## 2021-05-21 DIAGNOSIS — M79674 Pain in right toe(s): Secondary | ICD-10-CM | POA: Diagnosis not present

## 2021-05-21 DIAGNOSIS — M79675 Pain in left toe(s): Secondary | ICD-10-CM | POA: Diagnosis not present

## 2021-05-21 DIAGNOSIS — B351 Tinea unguium: Secondary | ICD-10-CM | POA: Diagnosis not present

## 2021-05-21 DIAGNOSIS — E119 Type 2 diabetes mellitus without complications: Secondary | ICD-10-CM

## 2021-05-21 DIAGNOSIS — R4182 Altered mental status, unspecified: Secondary | ICD-10-CM | POA: Insufficient documentation

## 2021-05-21 NOTE — Progress Notes (Signed)
This patient returns to my office for at risk foot care.  This patient requires this care by a professional since this patient will be at risk due to having type 1 diabetes. He presents to the office with his daughter..  This patient is unable to cut nails himself since the patient cannot reach his nails.These nails are painful walking and wearing shoes.  This patient presents for at risk foot care today.  General Appearance  Alert, conversant and in no acute stress.  Vascular  Dorsalis pedis and posterior tibial  pulses are palpable  Right.  Weakly palpable pulses left foot..  Capillary return is within normal limits  bilaterally. Temperature is within normal limits  bilaterally.  Neurologic  Senn-Weinstein monofilament wire test within normal limits  bilaterally. Muscle power within normal limits bilaterally.  Nails Thick disfigured discolored nails with subungual debris  from hallux to fifth toes bilaterally. No evidence of bacterial infection or drainage bilaterally.  Orthopedic  No limitations of motion  feet .  No crepitus or effusions noted.  No bony pathology or digital deformities noted.  Skin  normotropic skin with no porokeratosis noted bilaterally.  No signs of infections or ulcers noted.     Onychomycosis  Pain in right toes  Pain in left toes  Consent was obtained for treatment procedures.   Mechanical debridement of nails 1-5  bilaterally performed with a nail nipper.  Filed with dremel without incident.    Return office visit 3 months                     Told patient to return for periodic foot care and evaluation due to potential at risk complications.   Tarron Krolak DPM  

## 2021-08-07 ENCOUNTER — Other Ambulatory Visit: Payer: Self-pay | Admitting: Neurology

## 2021-08-07 DIAGNOSIS — G20 Parkinson's disease: Secondary | ICD-10-CM

## 2021-08-09 ENCOUNTER — Other Ambulatory Visit: Payer: Self-pay | Admitting: Neurology

## 2021-08-09 DIAGNOSIS — G20 Parkinson's disease: Secondary | ICD-10-CM

## 2021-08-12 ENCOUNTER — Other Ambulatory Visit: Payer: Self-pay

## 2021-08-12 DIAGNOSIS — G20 Parkinson's disease: Secondary | ICD-10-CM

## 2021-08-12 MED ORDER — DONEPEZIL HCL 10 MG PO TABS: ORAL_TABLET | ORAL | 0 refills | Status: DC

## 2021-08-18 NOTE — Progress Notes (Unsigned)
Assessment/Plan:   1.  Parkinsons Disease  -Continue carbidopa/levodopa 25/100, 1 tablet 3 times per day.  Levodopa is taking at strange times a day because of son's work schedule.   2.  Dementia  -Had neurocognitive testing in June, 2021 with evidence of dementia, both parkinsonism and Alzheimer's.  -Discussed again that he should not be driving.  -Continue donepezil, 10 mg daily.  -sleep schedule continues to be an issue.  Son works at night and comes home middle of the night and pt wants to get up and eat then and then sleeps some in day.    3.  B12 deficiency  -Continue over-the-counter B12  4.  ? TGA  -Patient had an episode in July, 2022 where he had trouble remembering the people around him.  Patient does have baseline dementia, so difficult exactly to say what this was.  CT brain was negative.  Subjective:   Christian Morgan was seen today in follow up for parkinsonism.  My previous records were reviewed prior to todays visit as well as outside records available to me.  Patient is with his son who supplements the history.  Pt denies falls.  Pt denies lightheadedness, near syncope.  No hallucinations.  Mood has been good.  Current prescribed movement disorder medications: Carbidopa/levodopa 25/100, 1 tablet 3 times per day  Donepezil, 10 mg daily (increased last visit) B12 supplement  PREVIOUS MEDICATIONS: Sinemet  ALLERGIES:   Allergies  Allergen Reactions   Lipitor [Atorvastatin] Other (See Comments)    Increased liver enzymes and joint pain from numerous statins   Penicillins Hives and Rash    Did it involve swelling of the face/tongue/throat, SOB, or low BP? No Did it involve sudden or severe rash/hives, skin peeling, or any reaction on the inside of your mouth or nose? No Did you need to seek medical attention at a hospital or doctor's office? yes When did it last happen?       30 years If all above answers are "NO", may proceed with cephalosporin use.    Amitriptyline Hcl     Unknown reaction   Cyclobenzaprine     Burning in stomach   Gabapentin     Dizziness    Other Other (See Comments)    [place]: "Terrible migraines."   Zetia [Ezetimibe]     Joint pain   Aspirin Other (See Comments)    Upset stomach   Doxycycline Hives    Reaction took 4-5 days   Iodinated Contrast Media Hives    Developed hives after contrast "50 years ago when I had a subdural hematoma" at age 24.  Tolerates now with Benadryl 50mg  PO one hour before contrast.   Levofloxacin Hives    Delayed allergic reaction    CURRENT MEDICATIONS:  Outpatient Encounter Medications as of 08/20/2021  Medication Sig   acetaminophen (TYLENOL) 500 MG tablet Take 500 mg by mouth every 6 (six) hours as needed for moderate pain or headache.   betamethasone dipropionate 0.05 % cream Apply 1 application topically 2 (two) times daily.   carbidopa-levodopa (SINEMET IR) 25-100 MG tablet TAKE 1 TABLET BY MOUTH THREE TIMES A DAY   Coconut Oil 1000 MG CAPS Take by mouth.   donepezil (ARICEPT) 10 MG tablet TAKE 1 TABLET BY MOUTH EVERYDAY AT BEDTIME   donepezil (ARICEPT) 10 MG tablet TAKE 1 TABLET BY MOUTH EVERYDAY AT BEDTIME   escitalopram (LEXAPRO) 10 MG tablet Take 5 mg by mouth daily.    levocetirizine (XYZAL)  5 MG tablet Take 5 mg by mouth every evening.   Light Mineral Oil-Mineral Oil (RETAINE MGD OP) Place 1 drop into the right eye daily as needed (watery eye).   loratadine (CLARITIN) 10 MG tablet Take 10 mg by mouth daily.   losartan (COZAAR) 100 MG tablet Take 100 mg by mouth daily.   metFORMIN (GLUCOPHAGE-XR) 500 MG 24 hr tablet Take 1,000 mg by mouth 2 (two) times daily.   rosuvastatin (CRESTOR) 5 MG tablet Take 5 mg by mouth daily.   sildenafil (REVATIO) 20 MG tablet 1-5   triamcinolone acetonide (KENALOG-40) 40 MG/ML injection Inject 2 mg into the muscle once. (Patient not taking: Reported on 08/12/2020)   Turmeric 500 MG TABS 2 tablets   vitamin B-12 (CYANOCOBALAMIN) 1000 MCG  tablet Take 1,000 mcg by mouth daily.   No facility-administered encounter medications on file as of 08/20/2021.    Objective:   PHYSICAL EXAMINATION:    VITALS:   There were no vitals filed for this visit.    GEN:  The patient appears stated age and is in NAD. HEENT:  Normocephalic, atraumatic.  The mucous membranes are moist.   Neurological examination:  Orientation: The patient is alert and oriented x2. Cranial nerves: There is good facial symmetry with mild facial hypomimia. The speech is fluent and clear. Soft palate rises symmetrically and there is no tongue deviation. Hearing is intact to conversational tone. Sensation: Sensation is intact to light touch throughout Motor: Strength is at least antigravity x4.  Movement examination: Tone: There is minimal increased tone in the RUE.  Tone is normal in the LUE/LLE Abnormal movements: there is mild RUE rest tremor, worse with ambulation Coordination:  There is no significant decremation with rams Gait and Station: The patient pushes off to arise. The patient's stride length is decreased with RUE rest tremor.  He shuffles and is forward flexed  I have reviewed and interpreted the following labs independently    Chemistry      Component Value Date/Time   NA 139 01/11/2019 0649   K 4.0 01/11/2019 0649   CL 106 01/11/2019 0649   CO2 24 01/11/2019 0649   BUN 23 01/11/2019 0649   CREATININE 1.06 01/11/2019 0649      Component Value Date/Time   CALCIUM 9.2 01/11/2019 0649       Lab Results  Component Value Date   WBC 7.2 01/11/2019   HGB 12.3 (L) 01/11/2019   HCT 37.9 (L) 01/11/2019   MCV 94.3 01/11/2019   PLT 257 01/11/2019    No results found for: "TSH"  Total time spent on today's visit was *** minutes, including both face-to-face time and nonface-to-face time.  Time included that spent on review of records (prior notes available to me/labs/imaging if pertinent), discussing treatment and goals, answering  patient's questions and coordinating care.   Cc:  [name], MD

## 2021-08-20 ENCOUNTER — Ambulatory Visit: Payer: Medicare Other | Admitting: Neurology

## 2021-08-20 ENCOUNTER — Encounter: Payer: Self-pay | Admitting: Neurology

## 2021-08-20 VITALS — BP 120/60 | HR 72 | Ht 66.0 in | Wt 227.8 lb

## 2021-08-20 DIAGNOSIS — G20 Parkinson's disease: Secondary | ICD-10-CM | POA: Diagnosis not present

## 2021-08-20 NOTE — Patient Instructions (Addendum)
Local and Online Resources for Power over Parkinson's Group June 2023  LOCAL [place] PARKINSON'S GROUPS  Power over Parkinson's Group:   Power Over Parkinson's Patient Education Group will be Wednesday, June 14th-*Hybrid meting*- in person at [facility] location and via WEBEX at 2:00 pm.   Upcoming Power over Parkinson's Meetings:  2nd Wednesdays of the month at 2 pm:   June 14th, July 12th Contact Amy Marriott at [email or web address] if interested in participating in this group Parkinson's Care [facility] Group:    3rd Mondays, Contact Misty Paladino Atypical Parkinsonian Patient Group:   4th Wednesdays, Contact Misty Paladino If you are interested in participating in these groups with Misty, please contact her directly for how to join those meetings.  Her contact information is [email or web address].    LOCAL EVENTS AND NEW OFFERINGS Dance Class for People with Parkinson's at [employer].  Friday, June 9th at 2 pm.  Led by [employer] DPT students.  Contact [email or web address] to register or with questions. Ice Cream Social at [employer]!  Thursday, June 15th, 5:30-7:00 pm.  RSVP to [email or web address] for attendance and free ice cream. Parkinson's T-shirts for sale!  Designed by a local group member, with funds going to Movement Disorders Fund.  $25.00  Contact Misty to purchase  New PWR! Moves Community Fitness Instructor-Led Class offering at [employer]!  Wednesdays 1-2 pm, starting April 12th.   Contact Susan Laney, Fitness Manager at [employer].  [email or web address]  ONLINE EDUCATION AND SUPPORT Parkinson Foundation:  www.parkinson.org PD Health at Home continues:  Mindfulness Mondays, Wellness Wednesdays, Fitness Fridays  Upcoming Education: Parkinson's 101:  What You and Your Family Should Know.  Wednesday, June 7th at 1:00 pm Register for expert briefings (webinars) at  https://www.[email or web address].org/resources-support/online-education/expert-briefings-webinars Please check out their website to sign up for emails and see their full online offerings   [employer] Foundation:  www.michaeljfox.org  Third Thursday Webinars:  On the third Thursday of every month at 12 p.m. ET, join our free live webinars to learn about various aspects of living with Parkinson's disease and our work to speed medical breakthroughs. Upcoming Webinar: REPLAY:  From Low Blood Pressure to Bladder Problems:  A Look at Lesser Known Parkinson's Symptoms.  Thursday, June 15th at 12 noon. Check out additional information on their website to see their full online offerings  [employer] Phin[employer] Foundation:  www.davisphinneyfoundation.org Upcoming Webinar:   Stay tuned Webinar Series:  Living with Parkinson's Meetup.   Third Thursdays each month, 3 pm Care Partner Monthly Meetup.  With Connie Carpenter Phinney.  First Tuesday of each month, 2 pm Check out additional information to Live Well Today on their website  Parkinson and Movement Disorders (PMD) Alliance:  www.pmdalliance.org [employer] Online:  Online Education Events Sign up for emails, which are sent weekly to give you updates on programming and online offerings  Parkinson's Association of [place]:  www.parkinsonassociation.org Information on online support groups, education events, and online exercises including Yoga, Parkinson's exercises and more-LOTS of information on links to PD resources and online events Virtual Support Group through Parkinson's Association of [place]; next one is scheduled for Wednesday, June 7th at 2 pm. (These are typically scheduled for the 1st Wednesday of the month at 2 pm).  Visit website for details. Save the date for "Caring for Parkinson's-Caring for You", 9th Annual Symposium.  In-person event in [place].  September 9th.  More info on registration to come. MOVEMENT AND EXERCISE OPPORTUNITIES PWR!  Moves Classes at [facility].  Wednesdays 10 and 11   am.   Contact Amy Marriott, PT [email or web address] if interested. NEW PWR! Moves Class offering at [employer].  Wednesdays 1-2 pm, starting April 12th.  Contact Susan Laney, Fitness Manager at [employer].  [email or web address] Here is a link to the PWR!Moves classes on Zoom from [place] Parkinson's Foundation - Daily Mon-Sat at 10:00. Via Zoom, FREE and open to all.  There is also a link below via Facebook if you use that platform.  https://www.parkinsonsmi.org/mpf-programs/exercise-and-movement-activities https://www.facebook.com/ParkinsonsMI.org/posts/pwr-moves-exercise-class-parkinson-wellness-recovery-online-with-angee-ludwa-pt-/10156827878021813/  Parkinson's Wellness Recovery (PWR! Moves)  www.pwr4life.org Info on the PWR! Virtual Experience:  You will have access to our expertise through self-assessment, guided plans that start with the PD-specific fundamentals, educational content, tips, Q&A with an expert, and a growing library of PD-specific pre-recorded and live exercise classes of varying types and intensity - both physical and cognitive! If that is not enough, we offer 1:1 wellness consultations (in-person or virtual) to personalize your PWR! Virtual Experience.  Parkinson Foundation Fitness Fridays:  As part of the PD Health @ Home program, this free video series focuses each week on one aspect of fitness designed to support people living with Parkinson's.  These weekly videos highlight the Parkinson Foundation recent fitness guidelines for people with Parkinson's disease. www.parkinson.org/resources-support/online-education/pdhealth#ff Dance for PD website is offering free, live-stream classes throughout the week, as well as links to digital library of classes:  https://danceforparkinsons.org/ Virtual dance and Pilates for Parkinson's classes: Click on the Community Tab> Parkinson's Movement Initiative  Tab.  To register for classes and for more information, visit www.americandancefestival.org and click the "community" tab.  [employer] Parkinson's Cycling Classes  [employer]:  Thursdays @ Noon-Live classes at [employer] (Contact Margaret Hazen at [email or web address] or [phone]) [employer]: Virtual Classes Mondays and Thursdays /Live classes Tuesday, Wednesday and Thursday (contact Marlee at [email or web address]  or [phone]) [place] Steady Boxing Varied levels of classes are offered Tuesdays and Thursdays at [employer] [facility] [facility].  Stretching with Maria weekly class is also offered for people with Parkinson's To observe a class or for more information, call [phone] or email Hillary Savage at [email or web address] ADDITIONAL SUPPORT AND RESOURCES [facility] [employer]:Online Caregiver Education Opportunities:  www.well-springsolutions.org/caregiver-education/caregiver-support-group.  You may also contact Jodi Kolada at [email or web address] or [phone].    [facility] Navigator:  Just1Navigator program, a free service to help individuals and families through the journey of determining care for older adults.  The "Navigator" is a social worker, Nicole Reynolds, who will speak with a prospective client and/or loved ones to provide an assessment of the situation and a set of recommendations for a personalized care plan -- all free of charge, and whether [facility] offers the needed service or not. If the need is not a service we provide, we are well-connected with reputable programs in town that we can refer you to.  www.well-springsolutions.org or to speak with the Navigator, call [phone]. Family Caregiver Programming in June:  [employer], Thursday, June 15th 11-12:30 at [employer], [place].  Call [phone] to register  

## 2021-08-25 ENCOUNTER — Ambulatory Visit (INDEPENDENT_AMBULATORY_CARE_PROVIDER_SITE_OTHER): Payer: Medicare Other | Admitting: Podiatry

## 2021-08-25 ENCOUNTER — Encounter: Payer: Self-pay | Admitting: Podiatry

## 2021-08-25 DIAGNOSIS — M79674 Pain in right toe(s): Secondary | ICD-10-CM | POA: Diagnosis not present

## 2021-08-25 DIAGNOSIS — M79675 Pain in left toe(s): Secondary | ICD-10-CM

## 2021-08-25 DIAGNOSIS — B351 Tinea unguium: Secondary | ICD-10-CM | POA: Diagnosis not present

## 2021-08-25 DIAGNOSIS — E119 Type 2 diabetes mellitus without complications: Secondary | ICD-10-CM

## 2021-08-29 ENCOUNTER — Ambulatory Visit: Payer: Medicare Other | Attending: Neurology | Admitting: Physical Therapy

## 2021-08-29 ENCOUNTER — Encounter: Payer: Self-pay | Admitting: Physical Therapy

## 2021-08-29 DIAGNOSIS — G20 Parkinson's disease: Secondary | ICD-10-CM | POA: Diagnosis not present

## 2021-08-29 DIAGNOSIS — R293 Abnormal posture: Secondary | ICD-10-CM | POA: Diagnosis not present

## 2021-08-29 DIAGNOSIS — R2689 Other abnormalities of gait and mobility: Secondary | ICD-10-CM | POA: Diagnosis not present

## 2021-08-29 DIAGNOSIS — R2681 Unsteadiness on feet: Secondary | ICD-10-CM | POA: Insufficient documentation

## 2021-08-29 DIAGNOSIS — R29818 Other symptoms and signs involving the nervous system: Secondary | ICD-10-CM | POA: Diagnosis not present

## 2021-08-29 NOTE — Therapy (Signed)
OUTPATIENT PHYSICAL THERAPY NEURO EVALUATION   Patient Name: Christian Morgan MRN: [id number] DOB:1941/01/04, 81 y.o., male Today's Date: 08/29/2021   PCP: [name], MD REFERRING PROVIDER:  Vladimir Faster, DO     PT End of Session - 08/29/21 1328     Visit Number 1    Number of Visits 9    Date for PT Re-Evaluation 10/28/21   due to potential delay in scheduling   Authorization Type [employer] Medicare    PT Start Time 1233    PT Stop Time 1318    PT Time Calculation (min) 45 min    Equipment Utilized During Treatment Gait belt    Activity Tolerance Patient tolerated treatment well    Behavior During Therapy [facility] for tasks assessed/performed             Past Medical History:  Diagnosis Date   Allergic rhinitis    Chronic headache    Complication of anesthesia    after eye surgery, had trouble catching breath   Daytime somnolence 01/05/2017   Essential hypertension 10/31/2009   Hard of hearing    Hyperglycemia due to diabetes mellitus 09/20/2017   Hyperlipidemia    Hypertension    Major neurocognitive disorder, unclear etiology 08/02/2019   OSA (obstructive sleep apnea) 2011   s/p septoplasty; was supposed to wear cpap but better after septoplasty   Type I diabetes mellitus 10/31/2009   Past Surgical History:  Procedure Laterality Date   BACK SURGERY     COLONOSCOPY     EYE SURGERY Bilateral    cataract surgery with lens implants   NASAL SEPTOPLASTY W/ TURBINOPLASTY Bilateral 01/11/2019   Procedure: NASAL SEPTOPLASTY;  Surgeon: Newman Pies, MD;  Location: [facility] OR;  Service: ENT;  Laterality: Bilateral;   S/p Crani for CHI     subdural hematoma    SHOULDER SURGERY  2009   Right   TURBINATE REDUCTION Bilateral 01/11/2019   Procedure: Turbinate Reduction;  Surgeon: Newman Pies, MD;  Location: [facility] OR;  Service: ENT;  Laterality: Bilateral;   Patient Active Problem List   Diagnosis Date Noted   Altered mental status 05/21/2021   Anxiety 08/07/2020   Aortic valve  disorder 08/07/2020   Atopic dermatitis 08/07/2020   Benign prostatic hyperplasia 08/07/2020   Constipation 08/07/2020   Dementia (HCC) 08/07/2020   ED (erectile dysfunction) of organic origin 08/07/2020   Moderate recurrent major depression (HCC) 08/07/2020   Morbid obesity (HCC) 08/07/2020   Nephrolithiasis 08/07/2020   Memory loss 08/07/2020   Overweight 08/07/2020   Parkinson's disease (HCC) 08/07/2020   Insomnia 08/07/2020   Postherpetic neuralgia 08/07/2020   Pure hypercholesterolemia 08/07/2020   Recurrent major depression in remission (HCC) 08/07/2020   Pain due to onychomycosis of toenails of both feet 01/03/2020   Diabetes mellitus without complication (HCC) 01/03/2020   Major neurocognitive disorder, unclear etiology 08/02/2019   Hyperglycemia due to diabetes mellitus 09/20/2017   Daytime somnolence 01/05/2017   Type I diabetes mellitus (HCC) 10/31/2009   Hyperlipidemia 10/31/2009   Obstructive sleep apnea 10/31/2009   Essential hypertension 10/31/2009   Chronic rhinitis 10/31/2009    ONSET DATE: 08/20/2021 (date of referral)  REFERRING DIAG: G20 (ICD-10-CM) - Parkinson's disease (HCC)  THERAPY DIAG:  Unsteadiness on feet  Other abnormalities of gait and mobility  Abnormal posture  Other symptoms and signs involving the nervous system  Rationale for Evaluation and Treatment Rehabilitation  SUBJECTIVE:  SUBJECTIVE STATEMENT: Pt's son reports that pt has been dragging his feet. No falls. Reports balance is good, just walks slow. Has not received PT yet for Parkinsonism. Used to go walking, but has not been walking recently. Pt's son reports that when he is consistent with the medications that tremor is better.    Pt accompanied by: family member - Son, Nevyn  PERTINENT  HISTORY: PMH: Parkinsonism, HLD, HTN, diabetes, dementia, HOH  PAIN:  Are you having pain? No  PRECAUTIONS: Other: Pt HOH , No driving   FALLS: Has patient fallen in last 6 months? No  LIVING ENVIRONMENT: Lives with: lives with their family - Son spends 99% of his time at their house. Lives in: House/apartment Stairs: No Has following equipment at home: Single point cane  PLOF: Independent  PATIENT GOALS "Doesn't know why he is here", per pt's son "Work on picking up feet"   OBJECTIVE:     COGNITION: Overall cognitive status: History of cognitive impairments - at baseline - pt has dementia    SENSATION: WFL  COORDINATION: Heel to shin WFL    POSTURE: rounded shoulders and forward head   LOWER EXTREMITY MMT:    MMT Right Eval Left Eval  Hip flexion 4/5 4/5  Hip extension    Hip abduction    Hip adduction    Hip internal rotation    Hip external rotation    Knee flexion 5/5 5/5  Knee extension 4+/5 4+/5  Ankle dorsiflexion 5/5 5/5  Ankle plantarflexion    Ankle inversion    Ankle eversion    (Blank rows = not tested)  BED MOBILITY:  Pt reports no trouble getting in and out of the bed  TRANSFERS: Assistive device utilized: None  Sit to stand: SBA Stand to sit: SBA Performs with wide BOS   GAIT: Gait pattern: step through pattern, decreased arm swing- Right, decreased arm swing- Left, decreased stride length, decreased hip/knee flexion- Right, decreased ankle dorsiflexion- Right, Right foot flat, decreased trunk rotation, and poor foot clearance- Right Distance walked: Clinic distances Assistive device utilized: None Level of assistance: SBA Comments: Pt with R scuffing throughout gait  FUNCTIONAL TESTs:  5 times sit to stand: 16.25 seconds without UE support from chair  10 meter walk test: 15.12 seconds =2.16 ft/sec    [facility] PT Assessment - 08/29/21 1253       Standardized Balance Assessment   Standardized Balance Assessment  Mini-BESTest;Timed Up and Go Test      Mini-BESTest   Sit To Stand Normal: Comes to stand without use of hands and stabilizes independently.    Rise to Toes Moderate: Heels up, but not full range (smaller than when holding hands), OR noticeable instability for 3 s.    Stand on one leg (left) Moderate: < 20 s   1-2 seconds   Stand on one leg (right) Moderate: < 20 s   1-2 seconds   Stand on one leg - lowest score 1    Compensatory Stepping Correction - Forward Normal: Recovers independently with a single, large step (second realignement is allowed).    Compensatory Stepping Correction - Backward No step, OR would fall if not caught, OR falls spontaneously.    Compensatory Stepping Correction - Left Lateral Severe: Falls, or cannot step    Compensatory Stepping Correction - Right Lateral Severe:  Falls, or cannot step    Stepping Corredtion Lateral - lowest score 0    Stance - Feet together, eyes open, firm surface  Normal:  30s    Stance - Feet together, eyes closed, foam surface  Moderate: < 30s   2 seconds   Incline - Eyes Closed Normal: Stands independently 30s and aligns with gravity    Change in Gait Speed Moderate: Unable to change walking speed or signs of imbalance    Walk with head turns - Horizontal Moderate: performs head turns with reduction in gait speed.    Walk with pivot turns Normal: Turns with feet close FAST (< 3 steps) with good balance.    Step over obstacles Moderate: Steps over box but touches box OR displays cautious behavior by slowing gait.    Timed UP & GO with Dual Task Moderate: Dual Task affects either counting OR walking (>10%) when compared to the TUG without Dual Task.    Mini-BEST total score 17      Timed Up and Go Test   Normal TUG (seconds) 15.57    Manual TUG (seconds) 16.28    Cognitive TUG (seconds) 18.5    TUG Comments Pt slow with turning              TODAY'S TREATMENT:  N/A with eval   PATIENT EDUCATION: Education details: Clinical  findings, POC, possibility of OT for coordination, fine motor tasks, handwriting. Pt not sure if he wants it at this time, per pt's son it would be beneficial to at least get the referral.  Person educated: Spouse and Child(ren), pt's son Education method: Explanation Education comprehension: verbalized understanding   HOME EXERCISE PROGRAM: Will give at next session.    GOALS: Goals reviewed with patient? Yes  SHORT TERM GOALS: ALL STGS = LTGS  LONG TERM GOALS: Target date: 09/26/2021  Pt will be independent with final HEP for strength, balance,mobility with family supervision/assist in order to build upon functional gains made in therapy.  Baseline:  Goal status: INITIAL  2.  Pt will improve 5x sit<>stand to less than or equal to 14 sec to demonstrate improved functional strength and transfer efficiency.   Baseline: 16.25 seconds without UE support from chair Goal status: INITIAL  3.  Pt will improve TUG time to 13.5 seconds or less in order to demo decrease fall risk.  Baseline: 15.57 seconds Goal status: INITIAL  4.  Pt will improve miniBEST to at least a 20/28 in order to demo decr fall risk.  Baseline: 17/28 Goal status: INITIAL  5.  Pt will improve gait speed with no AD to at least 2.7 ft/sec in order to demo improved community mobility.   Baseline: 2.16 ft/sec Goal status: INITIAL   ASSESSMENT:  CLINICAL IMPRESSION: Patient is a 81 year old male referred to Neuro OPPT for Parkinsonism.   Pt's PMH is significant for: Parkinsonism, HLD, HTN, diabetes, dementia. The following deficits were present during the exam: gait abnormalities, impaired balance, postural abnormalities, bradykinesia, impaired timing/coordination of gait,decr strength. Based on mini BEST, 5x sit <> stand, and TUG pt is an incr risk for falls. Pt's gait speed indicates a limited community ambulator. Pt would benefit from skilled PT to address these impairments and functional limitations to  maximize functional mobility independence    OBJECTIVE IMPAIRMENTS Abnormal gait, decreased activity tolerance, decreased cognition, decreased coordination, difficulty walking, decreased strength, impaired flexibility, and postural dysfunction.   ACTIVITY LIMITATIONS transfers and locomotion level  PARTICIPATION LIMITATIONS: community activity  PERSONAL FACTORS Age, Behavior pattern, Past/current experiences, Time since onset of injury/illness/exacerbation, and 3+ comorbidities: Parkinsonism, HLD, HTN, diabetes, dementia  are also affecting patient's functional outcome.  REHAB POTENTIAL: Good  CLINICAL DECISION MAKING: Evolving/moderate complexity  EVALUATION COMPLEXITY: Moderate  PLAN: PT FREQUENCY: 2x/week  PT DURATION: 8 weeks  PLANNED INTERVENTIONS: Therapeutic exercises, Therapeutic activity, Neuromuscular re-education, Balance training, Gait training, Patient/Family education, Stair training, and DME instructions  PLAN FOR NEXT SESSION: Pt has dementia, initial HEP for functional strengthening, posture, standing balance for pt to perform with his son. Work on Investment banker, operational with foot clearance/arm swing and RLE foot clearance tasks. Maybe standing PWR moves?    Drake Leach, PT, DPT 08/29/2021, 1:28 PM

## 2021-09-05 ENCOUNTER — Ambulatory Visit: Payer: Medicare Other | Attending: Neurology | Admitting: Physical Therapy

## 2021-09-05 DIAGNOSIS — M6281 Muscle weakness (generalized): Secondary | ICD-10-CM | POA: Insufficient documentation

## 2021-09-05 DIAGNOSIS — R278 Other lack of coordination: Secondary | ICD-10-CM | POA: Diagnosis not present

## 2021-09-05 DIAGNOSIS — R2689 Other abnormalities of gait and mobility: Secondary | ICD-10-CM | POA: Diagnosis not present

## 2021-09-05 DIAGNOSIS — R2681 Unsteadiness on feet: Secondary | ICD-10-CM | POA: Insufficient documentation

## 2021-09-05 DIAGNOSIS — R293 Abnormal posture: Secondary | ICD-10-CM | POA: Diagnosis not present

## 2021-09-05 NOTE — Therapy (Signed)
OUTPATIENT PHYSICAL THERAPY TREATMENT NOTE   Patient Name: Christian Morgan MRN: [id number] DOB:16-Aug-1940, 81 y.o., male Today's Date: 09/05/2021  PCP: [name], MD  REFERRING PROVIDER:  Vladimir Faster, DO   END OF SESSION:   PT End of Session - 09/05/21 1143     Visit Number 2    Number of Visits 9    Date for PT Re-Evaluation 10/28/21   due to potential delay in scheduling   Authorization Type [employer] Medicare    PT Start Time 1103    PT Stop Time 1143    PT Time Calculation (min) 40 min    Activity Tolerance Patient tolerated treatment well    Behavior During Therapy [facility] for tasks assessed/performed             Past Medical History:  Diagnosis Date   Allergic rhinitis    Chronic headache    Complication of anesthesia    after eye surgery, had trouble catching breath   Daytime somnolence 01/05/2017   Essential hypertension 10/31/2009   Hard of hearing    Hyperglycemia due to diabetes mellitus 09/20/2017   Hyperlipidemia    Hypertension    Major neurocognitive disorder, unclear etiology 08/02/2019   OSA (obstructive sleep apnea) 2011   s/p septoplasty; was supposed to wear cpap but better after septoplasty   Type I diabetes mellitus 10/31/2009   Past Surgical History:  Procedure Laterality Date   BACK SURGERY     COLONOSCOPY     EYE SURGERY Bilateral    cataract surgery with lens implants   NASAL SEPTOPLASTY W/ TURBINOPLASTY Bilateral 01/11/2019   Procedure: NASAL SEPTOPLASTY;  Surgeon: Newman Pies, MD;  Location: [facility] OR;  Service: ENT;  Laterality: Bilateral;   S/p Crani for CHI     subdural hematoma    SHOULDER SURGERY  2009   Right   TURBINATE REDUCTION Bilateral 01/11/2019   Procedure: Turbinate Reduction;  Surgeon: Newman Pies, MD;  Location: [facility] OR;  Service: ENT;  Laterality: Bilateral;   Patient Active Problem List   Diagnosis Date Noted   Altered mental status 05/21/2021   Anxiety 08/07/2020   Aortic valve disorder 08/07/2020   Atopic dermatitis  08/07/2020   Benign prostatic hyperplasia 08/07/2020   Constipation 08/07/2020   Dementia (HCC) 08/07/2020   ED (erectile dysfunction) of organic origin 08/07/2020   Moderate recurrent major depression (HCC) 08/07/2020   Morbid obesity (HCC) 08/07/2020   Nephrolithiasis 08/07/2020   Memory loss 08/07/2020   Overweight 08/07/2020   Parkinson's disease (HCC) 08/07/2020   Insomnia 08/07/2020   Postherpetic neuralgia 08/07/2020   Pure hypercholesterolemia 08/07/2020   Recurrent major depression in remission (HCC) 08/07/2020   Pain due to onychomycosis of toenails of both feet 01/03/2020   Diabetes mellitus without complication (HCC) 01/03/2020   Major neurocognitive disorder, unclear etiology 08/02/2019   Hyperglycemia due to diabetes mellitus 09/20/2017   Daytime somnolence 01/05/2017   Type I diabetes mellitus (HCC) 10/31/2009   Hyperlipidemia 10/31/2009   Obstructive sleep apnea 10/31/2009   Essential hypertension 10/31/2009   Chronic rhinitis 10/31/2009    REFERRING DIAG: G20 (ICD-10-CM) - Parkinson's disease (HCC)   THERAPY DIAG:  Unsteadiness on feet  Other lack of coordination  Muscle weakness (generalized)  Rationale for Evaluation and Treatment Rehabilitation  PERTINENT HISTORY: Parkinsonism, HLD, HTN, diabetes, dementia, HOH   PRECAUTIONS: Fall    SUBJECTIVE:  SUBJECTIVE STATEMENT: Pt reports no new changes from last session, has not done much this week    Pt accompanied by: family member - Son, Gery   PERTINENT HISTORY: PMH: Parkinsonism, HLD, HTN, diabetes, dementia, HOH   PAIN:  Are you having pain? No   PRECAUTIONS: Other: Pt HOH , No driving    FALLS: Has patient fallen in last 6 months? No   LIVING ENVIRONMENT: Lives with: lives with their family - Son spends 99% of his time at their  house. Lives in: House/apartment Stairs: No Has following equipment at home: Single point cane   PLOF: Independent   PATIENT GOALS "Doesn't know why he is here", per pt's son "Work on picking up feet"    OBJECTIVE:    COGNITION: Overall cognitive status: History of cognitive impairments - at baseline - pt has dementia                TODAY'S TREATMENT:  NMR  Established and demonstrated initial HEP for BLE strength, balance, step length/clearance and turns:    - Sit to stand with red band pull-apart , x20 reps w/mod multimodal cues for proper form and sequencing. Noted pt bracing against mat to stand and poor eccentric control while sitting. Min cues to scoot hips to edge of mat and slowly sit.    - Standing Quarter Turn with [employer], 10 reps per side. Pt initially demonstrating cross-step w/LLE to turn to R side and sliding of R foot despite max verbal cues. Added colored dot posterolateral to R side (~45 degrees) as target for pt to step to w/RLE w/R turn and pt able to lead w/R foot and increase step clearance and length. Added second dot as visual target for returning R foot to starting position w/min concurrent cues to pick up R foot rather than drag it. Pt did not require visual targets to perform on L side.    - Step Sideways with Arms Reaching, x10 reps per side. Added post-its superolaterally on cabinets for target to reach and colored dots on ground as targets for pt to step to and pt able to perform well. CGA throughout   Pt's son took video recording of exercises to recreate at home. Educated pt on using tape or non-slippery surface as target for stepping, as paper will slide on floor and be dangerous. Pt's son verbalized understanding.     PATIENT EDUCATION: Education details: Pt's son not remembering previous conversation regarding OT referral, do not wish to proceed at this time. Initial HEP  Person educated: Spouse and Child(ren), pt's son Education method:  Explanation Education comprehension: verbalized understanding     HOME EXERCISE PROGRAM: Access Code: [email or web address] URL: [email or web address] Date: 09/05/2021 Prepared by: Alethia Berthold Mikyah Alamo  Exercises - Sit to stand with red band pull-apart   - 1 x daily - 7 x weekly - 3 sets - 10 reps - Standing Quarter Turn with Counter Support  - 1 x daily - 7 x weekly - 3 sets - 10 reps - Step Sideways with Arms Reaching  - 1 x daily - 7 x weekly - 3 sets - 10 reps       GOALS: Goals reviewed with patient? Yes   SHORT TERM GOALS: ALL STGS = LTGS   LONG TERM GOALS: Target date: 09/26/2021   Pt will be independent with final HEP for strength, balance,mobility with family supervision/assist in order to build upon functional gains made in therapy.   Baseline:  Goal status: INITIAL  2.  Pt will improve 5x sit<>stand to less than or equal to 14 sec to demonstrate improved functional strength and transfer efficiency.    Baseline: 16.25 seconds without UE support from chair Goal status: INITIAL   3.  Pt will improve TUG time to 13.5 seconds or less in order to demo decrease fall risk.   Baseline: 15.57 seconds Goal status: INITIAL   4.  Pt will improve miniBEST to at least a 20/28 in order to demo decr fall risk.   Baseline: 17/28 Goal status: INITIAL   5.  Pt will improve gait speed with no AD to at least 2.7 ft/sec in order to demo improved community mobility.    Baseline: 2.16 ft/sec Goal status: INITIAL     ASSESSMENT:   CLINICAL IMPRESSION: Emphasis of skilled PT session on establishing initial HEP for improved balance, step length and BLE strength. Pt responds very well to visual cues and significantly improved quality of movement when visual targets added to exercises. Pt continues to demonstrate poor step length/clearance of RLE and crossover stepping, increasing risk of falls. Continue POC.        OBJECTIVE IMPAIRMENTS Abnormal gait, decreased activity tolerance,  decreased cognition, decreased coordination, difficulty walking, decreased strength, impaired flexibility, and postural dysfunction.    ACTIVITY LIMITATIONS transfers and locomotion level   PARTICIPATION LIMITATIONS: community activity   PERSONAL FACTORS Age, Behavior pattern, Past/current experiences, Time since onset of injury/illness/exacerbation, and 3+ comorbidities: Parkinsonism, HLD, HTN, diabetes, dementia  are also affecting patient's functional outcome.    REHAB POTENTIAL: Good   CLINICAL DECISION MAKING: Evolving/moderate complexity   EVALUATION COMPLEXITY: Moderate   PLAN: PT FREQUENCY: 2x/week   PT DURATION: 8 weeks   PLANNED INTERVENTIONS: Therapeutic exercises, Therapeutic activity, Neuromuscular re-education, Balance training, Gait training, Patient/Family education, Stair training, and DME instructions   PLAN FOR NEXT SESSION: How is HEP? Add corner balance and rotation to HEP (standing windmills?). [employer] for neural priming, Work on gait training with foot clearance/arm swing and RLE foot clearance tasks. Dot drills, lunges over barrier w/slam ball     Jill Alexanders Giliana Vantil, PT, DPT  09/05/2021, 11:44 AM

## 2021-09-09 ENCOUNTER — Ambulatory Visit: Payer: Medicare Other | Admitting: Physical Therapy

## 2021-09-09 DIAGNOSIS — R278 Other lack of coordination: Secondary | ICD-10-CM | POA: Diagnosis not present

## 2021-09-09 DIAGNOSIS — R2681 Unsteadiness on feet: Secondary | ICD-10-CM | POA: Diagnosis not present

## 2021-09-09 DIAGNOSIS — R293 Abnormal posture: Secondary | ICD-10-CM | POA: Diagnosis not present

## 2021-09-09 DIAGNOSIS — R2689 Other abnormalities of gait and mobility: Secondary | ICD-10-CM | POA: Diagnosis not present

## 2021-09-09 DIAGNOSIS — M6281 Muscle weakness (generalized): Secondary | ICD-10-CM | POA: Diagnosis not present

## 2021-09-09 NOTE — Therapy (Signed)
OUTPATIENT PHYSICAL THERAPY TREATMENT NOTE   Patient Name: Christian Morgan MRN: [id number] DOB:[birth year], 81 y.o., male Today's Date: 09/09/2021  PCP: Christian Dills, MD  REFERRING PROVIDER:  Vladimir Faster, DO   END OF SESSION:   PT End of Session - 09/09/21 1321     Visit Number 3    Number of Visits 9    Date for PT Re-Evaluation 10/28/21   due to potential delay in scheduling   Authorization Type [employer] Medicare    PT Start Time 1319   Pt arrived late   PT Stop Time 1358    PT Time Calculation (min) 39 min    Activity Tolerance Patient tolerated treatment well    Behavior During Therapy [name] for tasks assessed/performed              Past Medical History:  Diagnosis Date   Allergic rhinitis    Chronic headache    Complication of anesthesia    after eye surgery, had trouble catching breath   Daytime somnolence 01/05/2017   Essential hypertension 10/31/2009   Hard of hearing    Hyperglycemia due to diabetes mellitus 09/20/2017   Hyperlipidemia    Hypertension    Major neurocognitive disorder, unclear etiology 08/02/2019   OSA (obstructive sleep apnea) 2011   s/p septoplasty; was supposed to wear cpap but better after septoplasty   Type I diabetes mellitus 10/31/2009   Past Surgical History:  Procedure Laterality Date   BACK SURGERY     COLONOSCOPY     EYE SURGERY Bilateral    cataract surgery with lens implants   NASAL SEPTOPLASTY W/ TURBINOPLASTY Bilateral 01/11/2019   Procedure: NASAL SEPTOPLASTY;  Surgeon: Christian Pies, MD;  Location: [facility] OR;  Service: ENT;  Laterality: Bilateral;   S/p Crani for CHI     subdural hematoma    SHOULDER SURGERY  2009   Right   TURBINATE REDUCTION Bilateral 01/11/2019   Procedure: Turbinate Reduction;  Surgeon: Christian Pies, MD;  Location: [facility] OR;  Service: ENT;  Laterality: Bilateral;   Patient Active Problem List   Diagnosis Date Noted   Altered mental status 05/21/2021   Anxiety 08/07/2020   Aortic valve disorder 08/07/2020    Atopic dermatitis 08/07/2020   Benign prostatic hyperplasia 08/07/2020   Constipation 08/07/2020   Dementia (HCC) 08/07/2020   ED (erectile dysfunction) of organic origin 08/07/2020   Moderate recurrent major depression (HCC) 08/07/2020   Morbid obesity (HCC) 08/07/2020   Nephrolithiasis 08/07/2020   Memory loss 08/07/2020   Overweight 08/07/2020   Parkinson's disease (HCC) 08/07/2020   Insomnia 08/07/2020   Postherpetic neuralgia 08/07/2020   Pure hypercholesterolemia 08/07/2020   Recurrent major depression in remission (HCC) 08/07/2020   Pain due to onychomycosis of toenails of both feet 01/03/2020   Diabetes mellitus without complication (HCC) 01/03/2020   Major neurocognitive disorder, unclear etiology 08/02/2019   Hyperglycemia due to diabetes mellitus 09/20/2017   Daytime somnolence 01/05/2017   Type I diabetes mellitus (HCC) 10/31/2009   Hyperlipidemia 10/31/2009   Obstructive sleep apnea 10/31/2009   Essential hypertension 10/31/2009   Chronic rhinitis 10/31/2009    REFERRING DIAG: G20 (ICD-10-CM) - Parkinson's disease (HCC)   THERAPY DIAG:  Unsteadiness on feet  Other lack of coordination  Muscle weakness (generalized)  Rationale for Evaluation and Treatment Rehabilitation  PERTINENT HISTORY: Parkinsonism, HLD, HTN, diabetes, dementia, HOH   PRECAUTIONS: Fall    SUBJECTIVE:  SUBJECTIVE STATEMENT: Pt's son reports pt has not done many exercises as pt's sleep schedule does not align w/son's work schedule. Otherwise no new changes.    Pt accompanied by: family member - Son, Christian Morgan   PERTINENT HISTORY: PMH: Parkinsonism, HLD, HTN, diabetes, dementia, HOH   PAIN:  Are you having pain? No   PRECAUTIONS: Other: Pt HOH , No driving    FALLS: Has patient fallen in last 6 months? No   LIVING  ENVIRONMENT: Lives with: lives with their family - Son spends 99% of his time at their house. Lives in: House/apartment Stairs: No Has following equipment at home: Single point cane   PLOF: Independent   PATIENT GOALS "Doesn't know why he is here", per pt's son "Work on picking up feet"    OBJECTIVE:    COGNITION: Overall cognitive status: History of cognitive impairments - at baseline - pt has dementia                TODAY'S TREATMENT:  Self-care/home management  Pt's son informed therapist that performing HEP at home is difficult due to his working schedule and pt's sleep schedule. Will attempt to add more simple exercises for pt to perform at home that he can perform w/wife so that he is able to perform more regularly. Pt's son also stating that pt does not like to take his meds as he does not feel as though he needs him. Upon asking pt why he does not need them, pt reported he "has no pain". Educated pt on why he is taking Sinemet and his diagnosis of PD. Pt verbalized understanding, but per son, dementia is getting worse.   NMR  Added to HEP (see bolded below) for improved balance strategies and thoracic rotation:  - Standing on pillows/dog bed with eyes closed in corner, 3-4 reps w/ 30-45 second hold and feet close together. Noticed increased sway posteriorly but no LOB   - Plank with Thoracic Rotation on Counter, x10 reps per side w/therapist providing hand for visual target to "reach under" to. Min cues to maintain gaze on moving hand throughout.   Ther Ex  SciFit multi-peaks level 5 for 10 minutes using BUE/BLEs for dynamic cardiovascular conditioning, neural priming for UE/LE movement and global strengthening. Pt very fatigued following activity, unable to provide rating.   Gait pattern: step through pattern, decreased arm swing- Right, decreased step length- Right, decreased hip/knee flexion- Right, shuffling, trunk flexed, and poor foot clearance- Right Distance walked:  Various clinic distances  Assistive device utilized: None Level of assistance: SBA Comments: No cues provided due to pt not responding well to verbal cues.     PATIENT EDUCATION: Education details: Updates to HEP   Person educated: Spouse and Child(ren), pt's son Education method: Explanation Education comprehension: verbalized understanding     HOME EXERCISE PROGRAM: Access Code: [email or web address] URL: [email or web address] Date: 09/05/2021 Prepared by: Alethia Berthold Serigne Kubicek  Exercises - Sit to stand with red band pull-apart   - 1 x daily - 7 x weekly - 3 sets - 10 reps - Standing Quarter Turn with Counter Support  - 1 x daily - 7 x weekly - 3 sets - 10 reps - Step Sideways with Arms Reaching  - 1 x daily - 7 x weekly - 3 sets - 10 reps - Standing on pillows/dog bed with eyes closed in corner   - 1 x daily - 7 x weekly - 1 sets - 3-4 reps - 30-45 second hold - Plank with Thoracic Rotation  on Counter  - 1 x daily - 7 x weekly - 3 sets - 10 reps       GOALS: Goals reviewed with patient? Yes   SHORT TERM GOALS: ALL STGS = LTGS   LONG TERM GOALS: Target date: 09/26/2021   Pt will be independent with final HEP for strength, balance,mobility with family supervision/assist in order to build upon functional gains made in therapy.   Baseline:  Goal status: INITIAL   2.  Pt will improve 5x sit<>stand to less than or equal to 14 sec to demonstrate improved functional strength and transfer efficiency.    Baseline: 16.25 seconds without UE support from chair Goal status: INITIAL   3.  Pt will improve TUG time to 13.5 seconds or less in order to demo decrease fall risk.   Baseline: 15.57 seconds Goal status: INITIAL   4.  Pt will improve miniBEST to at least a 20/28 in order to demo decr fall risk.   Baseline: 17/28 Goal status: INITIAL   5.  Pt will improve gait speed with no AD to at least 2.7 ft/sec in order to demo improved community mobility.    Baseline: 2.16  ft/sec Goal status: INITIAL     ASSESSMENT:   CLINICAL IMPRESSION: Emphasis of skilled PT session on adding to HEP for balance and thoracic mobility and endurance. Pt's son verbalized difficulty w/managing HEP due to work schedule, so added more simple exercises for pt to perform w/wife instead. Pt tolerated SciFit well but fatigued very quickly. Continue POC.        OBJECTIVE IMPAIRMENTS Abnormal gait, decreased activity tolerance, decreased cognition, decreased coordination, difficulty walking, decreased strength, impaired flexibility, and postural dysfunction.    ACTIVITY LIMITATIONS transfers and locomotion level   PARTICIPATION LIMITATIONS: community activity   PERSONAL FACTORS Age, Behavior pattern, Past/current experiences, Time since onset of injury/illness/exacerbation, and 3+ comorbidities: Parkinsonism, HLD, HTN, diabetes, dementia  are also affecting patient's functional outcome.    REHAB POTENTIAL: Good   CLINICAL DECISION MAKING: Evolving/moderate complexity   EVALUATION COMPLEXITY: Moderate   PLAN: PT FREQUENCY: 2x/week   PT DURATION: 8 weeks   PLANNED INTERVENTIONS: Therapeutic exercises, Therapeutic activity, Neuromuscular re-education, Balance training, Gait training, Patient/Family education, Stair training, and DME instructions   PLAN FOR NEXT SESSION: How is HEP?  SciFit for neural priming, Work on gait training with foot clearance/arm swing and RLE foot clearance tasks. Dot drills, lunges over barrier w/slam ball     Cruzita Lederer Thad Osoria, PT, DPT  09/09/2021, 2:00 PM

## 2021-09-11 DIAGNOSIS — E1169 Type 2 diabetes mellitus with other specified complication: Secondary | ICD-10-CM | POA: Diagnosis not present

## 2021-09-11 DIAGNOSIS — E7800 Pure hypercholesterolemia, unspecified: Secondary | ICD-10-CM | POA: Diagnosis not present

## 2021-09-11 DIAGNOSIS — I10 Essential (primary) hypertension: Secondary | ICD-10-CM | POA: Diagnosis not present

## 2021-09-12 ENCOUNTER — Ambulatory Visit: Payer: Medicare Other | Admitting: Physical Therapy

## 2021-09-16 ENCOUNTER — Encounter: Payer: Self-pay | Admitting: Physical Therapy

## 2021-09-16 ENCOUNTER — Ambulatory Visit: Payer: Medicare Other | Admitting: Physical Therapy

## 2021-09-16 DIAGNOSIS — M6281 Muscle weakness (generalized): Secondary | ICD-10-CM

## 2021-09-16 DIAGNOSIS — R2681 Unsteadiness on feet: Secondary | ICD-10-CM | POA: Diagnosis not present

## 2021-09-16 DIAGNOSIS — R293 Abnormal posture: Secondary | ICD-10-CM | POA: Diagnosis not present

## 2021-09-16 DIAGNOSIS — R278 Other lack of coordination: Secondary | ICD-10-CM

## 2021-09-16 DIAGNOSIS — R2689 Other abnormalities of gait and mobility: Secondary | ICD-10-CM | POA: Diagnosis not present

## 2021-09-16 NOTE — Therapy (Signed)
OUTPATIENT PHYSICAL THERAPY TREATMENT NOTE   Patient Name: Christian Morgan MRN: [id number] DOB:1940/08/13, 81 y.o., male Today's Date: 09/16/2021  PCP: [name], MD  REFERRING PROVIDER:  Vladimir Faster, DO   END OF SESSION:   PT End of Session - 09/16/21 1107     Visit Number 4    Number of Visits 9    Date for PT Re-Evaluation 10/28/21   due to potential delay in scheduling   Authorization Type [employer] Medicare    PT Start Time 1105   pt arrived late   PT Stop Time 1145    PT Time Calculation (min) 40 min    Activity Tolerance Patient tolerated treatment well    Behavior During Therapy [facility] for tasks assessed/performed              Past Medical History:  Diagnosis Date   Allergic rhinitis    Chronic headache    Complication of anesthesia    after eye surgery, had trouble catching breath   Daytime somnolence 01/05/2017   Essential hypertension 10/31/2009   Hard of hearing    Hyperglycemia due to diabetes mellitus 09/20/2017   Hyperlipidemia    Hypertension    Major neurocognitive disorder, unclear etiology 08/02/2019   OSA (obstructive sleep apnea) 2011   s/p septoplasty; was supposed to wear cpap but better after septoplasty   Type I diabetes mellitus 10/31/2009   Past Surgical History:  Procedure Laterality Date   BACK SURGERY     COLONOSCOPY     EYE SURGERY Bilateral    cataract surgery with lens implants   NASAL SEPTOPLASTY W/ TURBINOPLASTY Bilateral 01/11/2019   Procedure: NASAL SEPTOPLASTY;  Surgeon: Newman Pies, MD;  Location: [facility] OR;  Service: ENT;  Laterality: Bilateral;   S/p Crani for CHI     subdural hematoma    SHOULDER SURGERY  2009   Right   TURBINATE REDUCTION Bilateral 01/11/2019   Procedure: Turbinate Reduction;  Surgeon: Newman Pies, MD;  Location: [facility] OR;  Service: ENT;  Laterality: Bilateral;   Patient Active Problem List   Diagnosis Date Noted   Altered mental status 05/21/2021   Anxiety 08/07/2020   Aortic valve disorder 08/07/2020    Atopic dermatitis 08/07/2020   Benign prostatic hyperplasia 08/07/2020   Constipation 08/07/2020   Dementia (HCC) 08/07/2020   ED (erectile dysfunction) of organic origin 08/07/2020   Moderate recurrent major depression (HCC) 08/07/2020   Morbid obesity (HCC) 08/07/2020   Nephrolithiasis 08/07/2020   Memory loss 08/07/2020   Overweight 08/07/2020   Parkinson's disease (HCC) 08/07/2020   Insomnia 08/07/2020   Postherpetic neuralgia 08/07/2020   Pure hypercholesterolemia 08/07/2020   Recurrent major depression in remission (HCC) 08/07/2020   Pain due to onychomycosis of toenails of both feet 01/03/2020   Diabetes mellitus without complication (HCC) 01/03/2020   Major neurocognitive disorder, unclear etiology 08/02/2019   Hyperglycemia due to diabetes mellitus 09/20/2017   Daytime somnolence 01/05/2017   Type I diabetes mellitus (HCC) 10/31/2009   Hyperlipidemia 10/31/2009   Obstructive sleep apnea 10/31/2009   Essential hypertension 10/31/2009   Chronic rhinitis 10/31/2009    REFERRING DIAG: G20 (ICD-10-CM) - Parkinson's disease (HCC)   THERAPY DIAG:  Other lack of coordination  Muscle weakness (generalized)  Unsteadiness on feet  Rationale for Evaluation and Treatment Rehabilitation  PERTINENT HISTORY: Parkinsonism, HLD, HTN, diabetes, dementia, HOH   PRECAUTIONS: Fall    SUBJECTIVE:  SUBJECTIVE STATEMENT: Nothing new. Just woke up from a nap so feeling a little tired.    Pt accompanied by: family member - Son, Christian Morgan   PERTINENT HISTORY: PMH: Parkinsonism, HLD, HTN, diabetes, dementia, HOH   PAIN:  Are you having pain? No   PRECAUTIONS: Other: Pt HOH , No driving    FALLS: Has patient fallen in last 6 months? No   LIVING ENVIRONMENT: Lives with: lives with their family - Son spends 99% of his time at  their house. Lives in: House/apartment Stairs: No Has following equipment at home: Single point cane   PLOF: Independent   PATIENT GOALS "Doesn't know why he is here", per pt's son "Work on picking up feet"    OBJECTIVE:    COGNITION: Overall cognitive status: History of cognitive impairments - at baseline - pt has dementia                TODAY'S TREATMENT:   NMR  Sit to stands on air ex and PWR Up posture in standing for 5 seconds, cued for opening up R hand in standing, x10 reps, performed without UE support and focus on eccentric control when lowering, first rep performed with min guard for balance.  On air ex; with wide BOS, reaching laterally and superiorly for trunk rotation/weight shifting and grabbing bean bag from therapist and tossing it into crate x10 reps each side, min guard at times for balance.  On blue foam beam:  -alternating forward step for foot clearance/stepping strategy x10 reps each leg to colorful floor dot as target and cues for big step back onto beam, pt needing to use BUE support. Cues throughout for foot clearance.  -3 reps down and back side stepping with BUE support, cues for incr foot clearance and BIG STOMPS. Pt able to perform one step with incr foot clearance and then reverts back to sliding his feet on the beam.  With smaller orange obstacle x15 reps lateal step overs for incr foot clearance/SLS, beginning with UE support > none, incr difficulty at times with clearing RLE.    Ther Ex  SciFit multi-peaks level 3.5 for 8 minutes using BUE/BLEs for dynamic cardiovascular conditioning, reciprocal movement patterns and global strengthening.   Discussed use of seated peddle bike or [employer] for home for BLE strengthening/aerobic activity. Pt's son is also looking into [employer] for a seated NuStep/SciFit for their home.   Gait pattern: step through pattern, decreased arm swing- Right, decreased step length- Right, decreased hip/knee flexion- Right,  shuffling, trunk flexed, and poor foot clearance- Right Distance walked: Various clinic distances  Assistive device utilized: None Level of assistance: SBA Comments: No cues provided due to pt not responding well to verbal cues. Discussed importance of focusing on trying to pick up RLE as this is not automatic anymore due to PD.     PATIENT EDUCATION: Education details: Continue with HEP.  Person educated:Pt's son Education method: Explanation Education comprehension: verbalized understanding     HOME EXERCISE PROGRAM: Access Code: [email or web address] URL: [email or web address] Date: 09/05/2021 Prepared by: Alethia Berthold Plaster  Exercises - Sit to stand with red band pull-apart   - 1 x daily - 7 x weekly - 3 sets - 10 reps - Standing Quarter Turn with Counter Support  - 1 x daily - 7 x weekly - 3 sets - 10 reps - Step Sideways with Arms Reaching  - 1 x daily - 7 x weekly - 3 sets - 10 reps - Standing on pillows/dog  bed with eyes closed in corner   - 1 x daily - 7 x weekly - 1 sets - 3-4 reps - 30-45 second hold - Plank with Thoracic Rotation on Counter  - 1 x daily - 7 x weekly - 3 sets - 10 reps       GOALS: Goals reviewed with patient? Yes   SHORT TERM GOALS: ALL STGS = LTGS   LONG TERM GOALS: Target date: 09/26/2021   Pt will be independent with final HEP for strength, balance,mobility with family supervision/assist in order to build upon functional gains made in therapy.   Baseline:  Goal status: INITIAL   2.  Pt will improve 5x sit<>stand to less than or equal to 14 sec to demonstrate improved functional strength and transfer efficiency.    Baseline: 16.25 seconds without UE support from chair Goal status: INITIAL   3.  Pt will improve TUG time to 13.5 seconds or less in order to demo decrease fall risk.   Baseline: 15.57 seconds Goal status: INITIAL   4.  Pt will improve miniBEST to at least a 20/28 in order to demo decr fall risk.   Baseline: 17/28 Goal  status: INITIAL   5.  Pt will improve gait speed with no AD to at least 2.7 ft/sec in order to demo improved community mobility.    Baseline: 2.16 ft/sec Goal status: INITIAL     ASSESSMENT:   CLINICAL IMPRESSION: Today's skilled session focused on BLE strengthening/endurance and balance strategies on compliant surfaces with focus on weight shifting and foot clearance. Pt does well with visual cues. Pt challenged by foot clearance activities, esp with RLE. Needing frequent cues throughout activities for foot clearance. Will continue to progress towards LTGs.        OBJECTIVE IMPAIRMENTS Abnormal gait, decreased activity tolerance, decreased cognition, decreased coordination, difficulty walking, decreased strength, impaired flexibility, and postural dysfunction.    ACTIVITY LIMITATIONS transfers and locomotion level   PARTICIPATION LIMITATIONS: community activity   PERSONAL FACTORS Age, Behavior pattern, Past/current experiences, Time since onset of injury/illness/exacerbation, and 3+ comorbidities: Parkinsonism, HLD, HTN, diabetes, dementia  are also affecting patient's functional outcome.    REHAB POTENTIAL: Good   CLINICAL DECISION MAKING: Evolving/moderate complexity   EVALUATION COMPLEXITY: Moderate   PLAN: PT FREQUENCY: 2x/week   PT DURATION: 8 weeks   PLANNED INTERVENTIONS: Therapeutic exercises, Therapeutic activity, Neuromuscular re-education, Balance training, Gait training, Patient/Family education, Stair training, and DME instructions   PLAN FOR NEXT SESSION: SciFit for neural priming, Work on gait training with foot clearance/arm swing and RLE foot clearance tasks. SLS balance. Dot drills, lunges over barrier w/slam ball . Pt does well with visual cues. Functional strengthening    Drake Leach, PT, DPT  09/16/2021, 1:03 PM

## 2021-09-19 ENCOUNTER — Ambulatory Visit: Payer: Medicare Other | Admitting: Physical Therapy

## 2021-09-19 DIAGNOSIS — M6281 Muscle weakness (generalized): Secondary | ICD-10-CM

## 2021-09-19 DIAGNOSIS — R2681 Unsteadiness on feet: Secondary | ICD-10-CM | POA: Diagnosis not present

## 2021-09-19 DIAGNOSIS — R278 Other lack of coordination: Secondary | ICD-10-CM | POA: Diagnosis not present

## 2021-09-19 DIAGNOSIS — R2689 Other abnormalities of gait and mobility: Secondary | ICD-10-CM | POA: Diagnosis not present

## 2021-09-19 DIAGNOSIS — R293 Abnormal posture: Secondary | ICD-10-CM | POA: Diagnosis not present

## 2021-09-19 NOTE — Therapy (Signed)
OUTPATIENT PHYSICAL THERAPY TREATMENT NOTE   Patient Name: Christian Morgan MRN: [id number] DOB:December 23, 1940, 81 y.o., male Today's Date: 09/19/2021  PCP: [name], MD  REFERRING PROVIDER:  Vladimir Faster, DO   END OF SESSION:   PT End of Session - 09/19/21 1108     Visit Number 5    Number of Visits 9    Date for PT Re-Evaluation 10/28/21   due to potential delay in scheduling   Authorization Type [employer] Medicare    PT Start Time 1105   Pt arrived late   PT Stop Time 1145    PT Time Calculation (min) 40 min    Activity Tolerance Patient tolerated treatment well    Behavior During Therapy [facility] for tasks assessed/performed              Past Medical History:  Diagnosis Date   Allergic rhinitis    Chronic headache    Complication of anesthesia    after eye surgery, had trouble catching breath   Daytime somnolence 01/05/2017   Essential hypertension 10/31/2009   Hard of hearing    Hyperglycemia due to diabetes mellitus 09/20/2017   Hyperlipidemia    Hypertension    Major neurocognitive disorder, unclear etiology 08/02/2019   OSA (obstructive sleep apnea) 2011   s/p septoplasty; was supposed to wear cpap but better after septoplasty   Type I diabetes mellitus 10/31/2009   Past Surgical History:  Procedure Laterality Date   BACK SURGERY     COLONOSCOPY     EYE SURGERY Bilateral    cataract surgery with lens implants   NASAL SEPTOPLASTY W/ TURBINOPLASTY Bilateral 01/11/2019   Procedure: NASAL SEPTOPLASTY;  Surgeon: Newman Pies, MD;  Location: [facility] OR;  Service: ENT;  Laterality: Bilateral;   S/p Crani for CHI     subdural hematoma    SHOULDER SURGERY  2009   Right   TURBINATE REDUCTION Bilateral 01/11/2019   Procedure: Turbinate Reduction;  Surgeon: Newman Pies, MD;  Location: [facility];  Service: ENT;  Laterality: Bilateral;   Patient Active Problem List   Diagnosis Date Noted   Altered mental status 05/21/2021   Anxiety 08/07/2020   Aortic valve disorder 08/07/2020    Atopic dermatitis 08/07/2020   Benign prostatic hyperplasia 08/07/2020   Constipation 08/07/2020   Dementia (HCC) 08/07/2020   ED (erectile dysfunction) of organic origin 08/07/2020   Moderate recurrent major depression (HCC) 08/07/2020   Morbid obesity (HCC) 08/07/2020   Nephrolithiasis 08/07/2020   Memory loss 08/07/2020   Overweight 08/07/2020   Parkinson's disease (HCC) 08/07/2020   Insomnia 08/07/2020   Postherpetic neuralgia 08/07/2020   Pure hypercholesterolemia 08/07/2020   Recurrent major depression in remission (HCC) 08/07/2020   Pain due to onychomycosis of toenails of both feet 01/03/2020   Diabetes mellitus without complication (HCC) 01/03/2020   Major neurocognitive disorder, unclear etiology 08/02/2019   Hyperglycemia due to diabetes mellitus 09/20/2017   Daytime somnolence 01/05/2017   Type I diabetes mellitus (HCC) 10/31/2009   Hyperlipidemia 10/31/2009   Obstructive sleep apnea 10/31/2009   Essential hypertension 10/31/2009   Chronic rhinitis 10/31/2009    REFERRING DIAG: G20 (ICD-10-CM) - Parkinson's disease (HCC)   THERAPY DIAG:  Unsteadiness on feet  Other lack of coordination  Muscle weakness (generalized)  Rationale for Evaluation and Treatment Rehabilitation  PERTINENT HISTORY: Parkinsonism, HLD, HTN, diabetes, dementia, HOH   PRECAUTIONS: Fall    SUBJECTIVE:  SUBJECTIVE STATEMENT: Son has not had a chance to go to [employer] to obtain seated bike/[employer] due to schedule. Son reports pt has not been performing exercises much. No falls. No pain, but pt states he is "itchy" due to sitting outside by fruit trees at home and getting bit by insects.    Pt accompanied by: family member - Son, Christian Morgan   PERTINENT HISTORY: PMH: Parkinsonism, HLD, HTN, diabetes, dementia, HOH   PAIN:  Are  you having pain? No   PRECAUTIONS: Other: Pt HOH , No driving    FALLS: Has patient fallen in last 6 months? No   LIVING ENVIRONMENT: Lives with: lives with their family - Son spends 99% of his time at their house. Lives in: House/apartment Stairs: No Has following equipment at home: Single point cane   PLOF: Independent   PATIENT GOALS "Doesn't know why he is here", per pt's son "Work on picking up feet"    OBJECTIVE:    COGNITION: Overall cognitive status: History of cognitive impairments - at baseline - pt has dementia                TODAY'S TREATMENT:  Ther Ex  SciFit multi-peaks level 5 for 9 minutes using BUE/BLEs for dynamic cardiovascular conditioning, reciprocal movement patterns and global strengthening.    Gait pattern: step through pattern, decreased arm swing- Right, decreased step length- Right, decreased hip/knee flexion- Right, shuffling, trunk flexed, and poor foot clearance- Right Distance walked: Various clinic distances  Assistive device utilized: None Level of assistance: SBA Comments: No cues provided due to pt not responding well to verbal cues. Discussed importance of focusing on trying to pick up RLE as this is not automatic anymore due to PD.   Self-care/Home management  Discussed POC moving forward and purpose of PT. Provided handout of local PD resources and encouraged son to find a class (PWR, boxing) for son to attend weekly for consistency and maintaining functional gains made in PT. Pt and son verbalized understanding.   NMR  Sit <>stand unilateral thrusters using 10# KB, x8 reps per side w/min cues for increased amplitude of movement. Pt demonstrated increased difficulty when performing on R side > L side.     PATIENT EDUCATION: Education details: Continue with HEP, handout of local PD resources for continued fitness post-DC, plan to DC next week.  Person educated:Pt's son Education method: Explanation and Handout  Education comprehension:  verbalized understanding     HOME EXERCISE PROGRAM: Access Code: [email or web address] URL: [email or web address] Date: 09/05/2021 Prepared by: Alethia Berthold Calie Buttrey  Exercises - Sit to stand with red band pull-apart   - 1 x daily - 7 x weekly - 3 sets - 10 reps - Standing Quarter Turn with Counter Support  - 1 x daily - 7 x weekly - 3 sets - 10 reps - Step Sideways with Arms Reaching  - 1 x daily - 7 x weekly - 3 sets - 10 reps - Standing on pillows/dog bed with eyes closed in corner   - 1 x daily - 7 x weekly - 1 sets - 3-4 reps - 30-45 second hold - Plank with Thoracic Rotation on Counter  - 1 x daily - 7 x weekly - 3 sets - 10 reps       GOALS: Goals reviewed with patient? Yes   SHORT TERM GOALS: ALL STGS = LTGS   LONG TERM GOALS: Target date: 09/26/2021   Pt will be independent with final HEP for strength, balance,mobility  with family supervision/assist in order to build upon functional gains made in therapy.   Baseline:  Goal status: INITIAL   2.  Pt will improve 5x sit<>stand to less than or equal to 14 sec to demonstrate improved functional strength and transfer efficiency.    Baseline: 16.25 seconds without UE support from chair Goal status: INITIAL   3.  Pt will improve TUG time to 13.5 seconds or less in order to demo decrease fall risk.   Baseline: 15.57 seconds Goal status: INITIAL   4.  Pt will improve miniBEST to at least a 20/28 in order to demo decr fall risk.   Baseline: 17/28 Goal status: INITIAL   5.  Pt will improve gait speed with no AD to at least 2.7 ft/sec in order to demo improved community mobility.    Baseline: 2.16 ft/sec Goal status: INITIAL     ASSESSMENT:   CLINICAL IMPRESSION: Emphasis of skilled PT session on endurance, increased amplitude and patient education. Pt's son inquired about increased frequency of PT to 3x/week, so spent majority of session discussing purpose of PT and importance of performing exercises/walking program at  home consistently to maintain functional gains made in therapy. Provided handout of community PD resources and son interested in boxing and PWR moves class. Pt continues to be limited by poor selective attention and decreased endurance 2/2 PD, dementia and sedentary lifestyle but has made functional improvements in therapy. Continue POC.        OBJECTIVE IMPAIRMENTS Abnormal gait, decreased activity tolerance, decreased cognition, decreased coordination, difficulty walking, decreased strength, impaired flexibility, and postural dysfunction.    ACTIVITY LIMITATIONS transfers and locomotion level   PARTICIPATION LIMITATIONS: community activity   PERSONAL FACTORS Age, Behavior pattern, Past/current experiences, Time since onset of injury/illness/exacerbation, and 3+ comorbidities: Parkinsonism, HLD, HTN, diabetes, dementia  are also affecting patient's functional outcome.    REHAB POTENTIAL: Good   CLINICAL DECISION MAKING: Evolving/moderate complexity   EVALUATION COMPLEXITY: Moderate   PLAN: PT FREQUENCY: 2x/week   PT DURATION: 8 weeks   PLANNED INTERVENTIONS: Therapeutic exercises, Therapeutic activity, Neuromuscular re-education, Balance training, Gait training, Patient/Family education, Stair training, and DME instructions   PLAN FOR NEXT SESSION: Plan to DC this week. SciFit for neural priming, Work on gait training with foot clearance/arm swing and RLE foot clearance tasks. SLS balance. Dot drills, lunges over barrier w/slam ball . Pt does well with visual cues. Functional strengthening    Jill Alexanders Leodan Bolyard, PT, DPT  09/19/2021, 11:46 AM

## 2021-09-23 ENCOUNTER — Ambulatory Visit: Payer: Medicare Other | Admitting: Physical Therapy

## 2021-09-23 DIAGNOSIS — R2681 Unsteadiness on feet: Secondary | ICD-10-CM

## 2021-09-23 DIAGNOSIS — M6281 Muscle weakness (generalized): Secondary | ICD-10-CM | POA: Diagnosis not present

## 2021-09-23 DIAGNOSIS — R278 Other lack of coordination: Secondary | ICD-10-CM | POA: Diagnosis not present

## 2021-09-23 DIAGNOSIS — R2689 Other abnormalities of gait and mobility: Secondary | ICD-10-CM | POA: Diagnosis not present

## 2021-09-23 DIAGNOSIS — R293 Abnormal posture: Secondary | ICD-10-CM | POA: Diagnosis not present

## 2021-09-23 NOTE — Therapy (Signed)
OUTPATIENT PHYSICAL THERAPY TREATMENT NOTE   Patient Name: Christian Morgan MRN: [id number] DOB:May 26, 1940, 81 y.o., male Today's Date: 09/23/2021  PCP: Christian Carol, MD  REFERRING PROVIDER:  Ludwig Clarks, DO   END OF SESSION:   PT End of Session - 09/23/21 1104     Visit Number 6    Number of Visits 9    Date for PT Re-Evaluation 10/28/21   due to potential delay in scheduling   Authorization Type [employer]HC Medicare    PT Start Time 1104    PT Stop Time 1148    PT Time Calculation (min) 44 min    Equipment Utilized During Treatment Gait belt    Activity Tolerance Patient tolerated treatment well    Behavior During Therapy WFL for tasks assessed/performed               Past Medical History:  Diagnosis Date   Allergic rhinitis    Chronic headache    Complication of anesthesia    after eye surgery, had trouble catching breath   Daytime somnolence 01/05/2017   Essential hypertension 10/31/2009   Hard of hearing    Hyperglycemia due to diabetes mellitus 09/20/2017   Hyperlipidemia    Hypertension    Major neurocognitive disorder, unclear etiology 08/02/2019   OSA (obstructive sleep apnea) 2011   s/p septoplasty; was supposed to wear cpap but better after septoplasty   Type I diabetes mellitus 10/31/2009   Past Surgical History:  Procedure Laterality Date   BACK SURGERY     COLONOSCOPY     EYE SURGERY Bilateral    cataract surgery with lens implants   NASAL SEPTOPLASTY W/ TURBINOPLASTY Bilateral 01/11/2019   Procedure: NASAL SEPTOPLASTY;  Surgeon: Christian Baptist, MD;  Location: [facility];  Service: ENT;  Laterality: Bilateral;   S/p Crani for CHI     subdural hematoma    SHOULDER SURGERY  2009   Right   TURBINATE REDUCTION Bilateral 01/11/2019   Procedure: Turbinate Reduction;  Surgeon: Christian Baptist, MD;  Location: [facility];  Service: ENT;  Laterality: Bilateral;   Patient Active Problem List   Diagnosis Date Noted   Altered mental status 05/21/2021   Anxiety 08/07/2020    Aortic valve disorder 08/07/2020   Atopic dermatitis 08/07/2020   Benign prostatic hyperplasia 08/07/2020   Constipation 08/07/2020   Dementia ([place]) 08/07/2020   ED (erectile dysfunction) of organic origin 08/07/2020   Moderate recurrent major depression ([place]) 08/07/2020   Morbid obesity ([place]) 08/07/2020   Nephrolithiasis 08/07/2020   Memory loss 08/07/2020   Overweight 08/07/2020   Parkinson's disease ([place]) 08/07/2020   Insomnia 08/07/2020   Postherpetic neuralgia 08/07/2020   Pure hypercholesterolemia 08/07/2020   Recurrent major depression in remission ([place]) 08/07/2020   Pain due to onychomycosis of toenails of both feet 01/03/2020   Diabetes mellitus without complication ([place]) 58/85/0277   Major neurocognitive disorder, unclear etiology 08/02/2019   Hyperglycemia due to diabetes mellitus 09/20/2017   Daytime somnolence 01/05/2017   Type I diabetes mellitus ([place]) 10/31/2009   Hyperlipidemia 10/31/2009   Obstructive sleep apnea 10/31/2009   Essential hypertension 10/31/2009   Chronic rhinitis 10/31/2009    REFERRING DIAG: G20 (ICD-10-CM) - Parkinson's disease ([place])   THERAPY DIAG:  Unsteadiness on feet  Muscle weakness (generalized)  Other abnormalities of gait and mobility  Rationale for Evaluation and Treatment Rehabilitation  PERTINENT HISTORY: Parkinsonism, HLD, HTN, diabetes, dementia, HOH   PRECAUTIONS: Fall    SUBJECTIVE:  SUBJECTIVE STATEMENT: Pt's son reports he plans to take the pt to community PD classes after d/c from PT. Pt's son has found an exercise machine from [employer] to purchase for pt use. Pt's son reports he needs another copy of the HEP, reprinted HEP for patient and son.   Pt accompanied by: family member - Son, Christian Morgan   PERTINENT HISTORY: PMH: Parkinsonism, HLD, HTN,  diabetes, dementia, HOH   PAIN:  Are you having pain? No   PRECAUTIONS: Other: Pt HOH , No driving    FALLS: Has patient fallen in last 6 months? No   LIVING ENVIRONMENT: Lives with: lives with their family - Son spends 99% of his time at their house. Lives in: House/apartment Stairs: No Has following equipment at home: Single point cane   PLOF: Independent   PATIENT GOALS "Doesn't know why he is here", per pt's son "Work on picking up feet"    OBJECTIVE:    COGNITION: Overall cognitive status: History of cognitive impairments - at baseline - pt has dementia                TODAY'S TREATMENT:  Ther Act  Goal assessment   Christian Morgan PT Assessment - 09/23/21 1109       Standardized Balance Assessment   Standardized Balance Assessment Mini-BESTest;Timed Up and Go Test      Mini-BESTest   Sit To Stand Normal: Comes to stand without use of hands and stabilizes independently.    Rise to Toes Normal: Stable for 3 s with maximum height.    Stand on one leg (left) Moderate: < 20 s    Stand on one leg (right) Moderate: < 20 s    Stand on one leg - lowest score 1    Compensatory Stepping Correction - Forward Normal: Recovers independently with a single, large step (second realignement is allowed).   one step fwd   Compensatory Stepping Correction - Backward No step, OR would fall if not caught, OR falls spontaneously.    Compensatory Stepping Correction - Left Lateral Moderate: Several steps to recover equilibrium   cross over step   Compensatory Stepping Correction - Right Lateral Severe:  Falls, or cannot step    Stepping Corredtion Lateral - lowest score 0    Stance - Feet together, eyes open, firm surface  Normal: 30s    Stance - Feet together, eyes closed, foam surface  Moderate: < 30s   18 sec   Incline - Eyes Closed Moderate: Stands independently < 30s OR aligns with surface   19 sec   Change in Gait Speed Moderate: Unable to change walking speed or signs of imbalance    Walk  with head turns - Horizontal Moderate: performs head turns with reduction in gait speed.    Walk with pivot turns Moderate:Turns with feet close SLOW (>4 steps) with good balance.    Step over obstacles Moderate: Steps over box but touches box OR displays cautious behavior by slowing gait.    Timed UP & GO with Dual Task Moderate: Dual Task affects either counting OR walking (>10%) when compared to the TUG without Dual Task.   instructed pt to count ascending order starting at 12 (he said 12, 6, 5, 4 etc)   Mini-BEST total score 16      Timed Up and Go Test   TUG Normal TUG;Cognitive TUG    Normal TUG (seconds) 21.53    Cognitive TUG (seconds) 21.44   count by 1's starting  at 12            Ther Ex  SciFit multi-peaks level 8 for 8 minutes using BUE/BLEs for dynamic cardiovascular conditioning, reciprocal movement patterns and global strengthening. Min cues to maintain steps/min >75 for increased intensity.    Gait pattern: step through pattern, decreased arm swing- Right, decreased step length- Right, decreased hip/knee flexion- Right, shuffling, trunk flexed, and poor foot clearance- Right Distance walked: Various clinic distances  Assistive device utilized: None Level of assistance: SBA Comments: No cues provided due to pt not responding well to verbal cues. Discussed importance of focusing on trying to pick up RLE as this is not automatic anymore due to PD.      PATIENT EDUCATION: Education details: Goal assessment, importance of putting 5/6-10 effort into walking in order to maintain function, as pt was not putting effort into MiniBest assessment. Continue plan to DC next session.  Person educated:Pt's son Education method: Explanation and Handout  Education comprehension: verbalized understanding     HOME EXERCISE PROGRAM: Access Code: [email or web address] URL: [email or web address] Date: 09/05/2021 Prepared by: Mickie Bail Plaster  Exercises - Sit to stand with red band  pull-apart   - 1 x daily - 7 x weekly - 3 sets - 10 reps - Standing Quarter Turn with Counter Support  - 1 x daily - 7 x weekly - 3 sets - 10 reps - Step Sideways with Arms Reaching  - 1 x daily - 7 x weekly - 3 sets - 10 reps - Standing on pillows/dog bed with eyes closed in corner   - 1 x daily - 7 x weekly - 1 sets - 3-4 reps - 30-45 second hold - Plank with Thoracic Rotation on Counter  - 1 x daily - 7 x weekly - 3 sets - 10 reps       GOALS: Goals reviewed with patient? Yes   SHORT TERM GOALS: ALL STGS = LTGS   LONG TERM GOALS: Target date: 09/26/2021   Pt will be independent with final HEP for strength, balance,mobility with family supervision/assist in order to build upon functional gains made in therapy.   Baseline:  Goal status: INITIAL   2.  Pt will improve 5x sit<>stand to less than or equal to 14 sec to demonstrate improved functional strength and transfer efficiency.    Baseline: 16.25 seconds without UE support from chair Goal status: INITIAL   3.  Pt will improve TUG time to 13.5 seconds or less in order to demo decrease fall risk.   Baseline: 15.57 seconds Goal status: INITIAL   4.  Pt will improve miniBEST to at least a 20/28 in order to demo decr fall risk.   Baseline: 17/28; 16/28  Goal status: NOT MET    5.  Pt will improve gait speed with no AD to at least 2.7 ft/sec in order to demo improved community mobility.    Baseline: 2.16 ft/sec Goal status: INITIAL     ASSESSMENT:   CLINICAL IMPRESSION: Emphasis of skilled PT session on beginning goal assessment, pt education and endurance. Pt scored a 16/28 on MiniBest, lower than his score at eval (17/28). Noted pt moving very slowly today despite cues, pt reports he "likes to move slow". Education with pt and son on importance of attending community PD exercise classes that information was provided for and encouraged pt's son to purchase exercise machine from [employer] to encourage ongoing  physical activity and reciprocal movement patterns. Plan to d/c next session.  OBJECTIVE IMPAIRMENTS Abnormal gait, decreased activity tolerance, decreased cognition, decreased coordination, difficulty walking, decreased strength, impaired flexibility, and postural dysfunction.    ACTIVITY LIMITATIONS transfers and locomotion level   PARTICIPATION LIMITATIONS: community activity   PERSONAL FACTORS Age, Behavior pattern, Past/current experiences, Time since onset of injury/illness/exacerbation, and 3+ comorbidities: Parkinsonism, HLD, HTN, diabetes, dementia  are also affecting patient's functional outcome.    REHAB POTENTIAL: Good   CLINICAL DECISION MAKING: Evolving/moderate complexity   EVALUATION COMPLEXITY: Moderate   PLAN: PT FREQUENCY: 2x/week   PT DURATION: 8 weeks   PLANNED INTERVENTIONS: Therapeutic exercises, Therapeutic activity, Neuromuscular re-education, Balance training, Gait training, Patient/Family education, Stair training, and DME instructions   PLAN FOR NEXT SESSION: Goal assessment and d/c   Excell Seltzer, PT, DPT  Mickie Bail Plaster, PT, DPT 09/23/2021, 11:55 AM

## 2021-09-26 ENCOUNTER — Ambulatory Visit: Payer: Medicare Other | Admitting: Physical Therapy

## 2021-09-26 ENCOUNTER — Encounter: Payer: Self-pay | Admitting: Physical Therapy

## 2021-09-26 DIAGNOSIS — R2681 Unsteadiness on feet: Secondary | ICD-10-CM | POA: Diagnosis not present

## 2021-09-26 DIAGNOSIS — M6281 Muscle weakness (generalized): Secondary | ICD-10-CM | POA: Diagnosis not present

## 2021-09-26 DIAGNOSIS — R2689 Other abnormalities of gait and mobility: Secondary | ICD-10-CM | POA: Diagnosis not present

## 2021-09-26 DIAGNOSIS — R293 Abnormal posture: Secondary | ICD-10-CM

## 2021-09-26 DIAGNOSIS — R278 Other lack of coordination: Secondary | ICD-10-CM | POA: Diagnosis not present

## 2021-09-26 NOTE — Therapy (Signed)
OUTPATIENT PHYSICAL THERAPY TREATMENT NOTE/DISCHARGE SUMMARY   Patient Name: Christian Morgan MRN: [id number] DOB:Aug 17, 1940, 81 y.o., male Today's Date: 09/26/2021  PCP: Christian Carol, MD  REFERRING PROVIDER:  Ludwig Clarks, DO   END OF SESSION:   PT End of Session - 09/26/21 1106     Visit Number 7    Number of Visits 9    Date for PT Re-Evaluation 10/28/21   due to potential delay in scheduling   Authorization Type [employer] Medicare    PT Start Time 1104    PT Stop Time 1139   full time not used due to D/C visit   PT Time Calculation (min) 35 min    Equipment Utilized During Treatment Gait belt    Activity Tolerance Patient tolerated treatment well    Behavior During Therapy Christian Morgan for tasks assessed/performed               Past Medical History:  Diagnosis Date   Allergic rhinitis    Chronic headache    Complication of anesthesia    after eye surgery, had trouble catching breath   Daytime somnolence 01/05/2017   Essential hypertension 10/31/2009   Hard of hearing    Hyperglycemia due to diabetes mellitus 09/20/2017   Hyperlipidemia    Hypertension    Major neurocognitive disorder, unclear etiology 08/02/2019   OSA (obstructive sleep apnea) 2011   s/p septoplasty; was supposed to wear cpap but better after septoplasty   Type I diabetes mellitus 10/31/2009   Past Surgical History:  Procedure Laterality Date   BACK SURGERY     COLONOSCOPY     EYE SURGERY Bilateral    cataract surgery with lens implants   NASAL SEPTOPLASTY W/ TURBINOPLASTY Bilateral 01/11/2019   Procedure: NASAL SEPTOPLASTY;  Surgeon: Christian Baptist, MD;  Location: [facility];  Service: ENT;  Laterality: Bilateral;   S/p Crani for CHI     subdural hematoma    SHOULDER SURGERY  2009   Right   TURBINATE REDUCTION Bilateral 01/11/2019   Procedure: Turbinate Reduction;  Surgeon: Christian Baptist, MD;  Location: [facility];  Service: ENT;  Laterality: Bilateral;   Patient Active Problem List   Diagnosis Date Noted    Altered mental status 05/21/2021   Anxiety 08/07/2020   Aortic valve disorder 08/07/2020   Atopic dermatitis 08/07/2020   Benign prostatic hyperplasia 08/07/2020   Constipation 08/07/2020   Dementia ([place]) 08/07/2020   ED (erectile dysfunction) of organic origin 08/07/2020   Moderate recurrent major depression ([place]) 08/07/2020   Morbid obesity ([place]) 08/07/2020   Nephrolithiasis 08/07/2020   Memory loss 08/07/2020   Overweight 08/07/2020   Parkinson's disease ([place]) 08/07/2020   Insomnia 08/07/2020   Postherpetic neuralgia 08/07/2020   Pure hypercholesterolemia 08/07/2020   Recurrent major depression in remission ([place]) 08/07/2020   Pain due to onychomycosis of toenails of both feet 01/03/2020   Diabetes mellitus without complication ([place]) 14/78/2956   Major neurocognitive disorder, unclear etiology 08/02/2019   Hyperglycemia due to diabetes mellitus 09/20/2017   Daytime somnolence 01/05/2017   Type I diabetes mellitus ([place]) 10/31/2009   Hyperlipidemia 10/31/2009   Obstructive sleep apnea 10/31/2009   Essential hypertension 10/31/2009   Chronic rhinitis 10/31/2009    REFERRING DIAG: G20 (ICD-10-CM) - Parkinson's disease ([place])   THERAPY DIAG:  Unsteadiness on feet  Muscle weakness (generalized)  Other abnormalities of gait and mobility  Abnormal posture  Rationale for Evaluation and Treatment Rehabilitation  PERTINENT HISTORY: Parkinsonism, HLD, HTN, diabetes, dementia, HOH  PRECAUTIONS: Fall    SUBJECTIVE:                                                                                                                                                                 SUBJECTIVE STATEMENT: Pt's son reports he is trying to walk more with him. Pt's son is going to pick up a recumbent bike from [employer] next week.    Pt accompanied by: family member - Son, Christian Morgan   PERTINENT HISTORY: PMH: Parkinsonism, HLD, HTN, diabetes, dementia, HOH   PAIN:  Are you having  pain? No   PRECAUTIONS: Other: Pt HOH , No driving    FALLS: Has patient fallen in last 6 months? No   LIVING ENVIRONMENT: Lives with: lives with their family - Son spends 99% of his time at their house. Lives in: House/apartment Stairs: No Has following equipment at home: Single point cane   PLOF: Independent   PATIENT GOALS "Doesn't know why he is here", per pt's son "Work on picking up feet"    OBJECTIVE:    COGNITION: Overall cognitive status: History of cognitive impairments - at baseline - pt has dementia                TODAY'S TREATMENT:  Ther Act   Goal assessment  5x sit <> stand: 13.3 seconds no UE support  Gait speed: 14.07 seconds with no AD = 2.34 ft/sec TUG: 14 seconds no AD  Reviewed community resources with pt's son, discussed that P[facility] moves classes would not be the most appropriate for pt due to his cognition, pt's son verbalized understanding   Therapeutic Exercise: NuStep with BLE/BUE at gear 3 for 10 minutes for strengthening, reciprocal movement patterns, activity tolerance.   10 reps sit <> stands on air ex without UE support On air ex with wide BOS lateral reaching to tap target in lateral/superior lateral directions x10 reps each side, pt more challenged reaching towards R. Alternating marching on level ground to boomwhacker as use for visual cue for ROM x10 reps each leg.      GAIT: Gait pattern: step through pattern, decreased arm swing- Right, decreased step length- Right, decreased hip/knee flexion- Right, shuffling, trunk flexed, and poor foot clearance- Right Distance walked: Various clinic distances  Assistive device utilized: None Level of assistance: SBA Comments: Pt does not respond well to verbal cues to pick up his feet. Pt reports that he just walks "slow". Discussed importance of focusing on trying to pick up RLE as this is not automatic anymore due to PD.    PHYSICAL THERAPY DISCHARGE SUMMARY  Visits from Start of Care:  7  Current functional level related to goals / functional outcomes: See LTGs/clinical assessment.   Remaining deficits: Postural abnormalities, cognitive  deficits due to dementia, impaired balance, gait abnormalities, decr strength, decr activity tolerance.   Education / Equipment: HEP, community resources.   Patient agrees to discharge. Patient goals were primarily not met. Patient is being discharged due to maximized rehab potential.      PATIENT EDUCATION: Education details: Goal assessment, plan to schedule PD screens in 6 months, importance of exercise and moving after D/C, verbally reviewed HEP  Person educated:Pt's son Education method: Explanation and Handout  Education comprehension: verbalized understanding     HOME EXERCISE PROGRAM: Access Code: [email or web address] URL: [email or web address] Date: 09/05/2021 Prepared by: Mickie Bail Plaster  Exercises - Sit to stand with red band pull-apart   - 1 x daily - 7 x weekly - 3 sets - 10 reps - Standing Quarter Turn with Counter Support  - 1 x daily - 7 x weekly - 3 sets - 10 reps - Step Sideways with Arms Reaching  - 1 x daily - 7 x weekly - 3 sets - 10 reps - Standing on pillows/dog bed with eyes closed in corner   - 1 x daily - 7 x weekly - 1 sets - 3-4 reps - 30-45 second hold - Plank with Thoracic Rotation on Counter  - 1 x daily - 7 x weekly - 3 sets - 10 reps       GOALS: Goals reviewed with patient? Yes   SHORT TERM GOALS: ALL STGS = LTGS   LONG TERM GOALS: Target date: 09/26/2021   Pt will be independent with final HEP for strength, balance,mobility with family supervision/assist in order to build upon functional gains made in therapy.   Baseline: pt doing exercises with his son at home.  Goal status: PARTIALLY MET   2.  Pt will improve 5x sit<>stand to less than or equal to 14 sec to demonstrate improved functional strength and transfer efficiency.    Baseline: 16.25 seconds without UE support from chair;  13.3 seconds on 09/26/21 Goal status: MET   3.  Pt will improve TUG time to 13.5 seconds or less in order to demo decrease fall risk.   Baseline: 15.57 seconds; 14 seconds on 09/26/21 Goal status: NOT MET   4.  Pt will improve miniBEST to at least a 20/28 in order to demo decr fall risk.   Baseline: 17/28; 16/28  Goal status: NOT MET    5.  Pt will improve gait speed with no AD to at least 2.7 ft/sec in order to demo improved community mobility.    Baseline: 2.16 ft/sec; 14.07 seconds with no AD = 2.34 ft/sec Goal status: NOT MET     ASSESSMENT:   CLINICAL IMPRESSION: Today's skilled session focused on assessing remainder of LTGs. Pt did not meet 3 out of 5 LTGs. Pt met LTG #2 in regards to 5x sit <> stand. Pt partially met LTG #1- is able to sometimes perform HEP when son is present, but is not compliant due to dementia. Provided resources on appropriate community fitness and pt's son bought a recumbent bike for pt to use at home. Pt does not respond well to cues for RLE foot clearance and pt reports "just walking slow". Pt has reached his maximum potential with PT at this time and will D/C. Both in agreement with plan. Will schedule for PD screens in 6-8 months.      OBJECTIVE IMPAIRMENTS Abnormal gait, decreased activity tolerance, decreased cognition, decreased coordination, difficulty walking, decreased strength, impaired flexibility, and postural dysfunction.    ACTIVITY LIMITATIONS transfers and  locomotion level   PARTICIPATION LIMITATIONS: community activity   PERSONAL FACTORS Age, Behavior pattern, Past/current experiences, Time since onset of injury/illness/exacerbation, and 3+ comorbidities: Parkinsonism, HLD, HTN, diabetes, dementia  are also affecting patient's functional outcome.    REHAB POTENTIAL: Good   CLINICAL DECISION MAKING: Evolving/moderate complexity   EVALUATION COMPLEXITY: Moderate   PLAN: PT FREQUENCY: 2x/week   PT DURATION: 8 weeks   PLANNED  INTERVENTIONS: Therapeutic exercises, Therapeutic activity, Neuromuscular re-education, Balance training, Gait training, Patient/Family education, Stair training, and DME instructions   PLAN FOR NEXT SESSION: D/C   Janann August, PT, DPT 09/26/21 12:03 PM

## 2021-10-03 DIAGNOSIS — R42 Dizziness and giddiness: Secondary | ICD-10-CM | POA: Diagnosis not present

## 2021-10-03 DIAGNOSIS — E1169 Type 2 diabetes mellitus with other specified complication: Secondary | ICD-10-CM | POA: Diagnosis not present

## 2021-10-03 DIAGNOSIS — Z5181 Encounter for therapeutic drug level monitoring: Secondary | ICD-10-CM | POA: Diagnosis not present

## 2021-10-03 DIAGNOSIS — I10 Essential (primary) hypertension: Secondary | ICD-10-CM | POA: Diagnosis not present

## 2021-10-03 DIAGNOSIS — E7800 Pure hypercholesterolemia, unspecified: Secondary | ICD-10-CM | POA: Diagnosis not present

## 2021-11-03 ENCOUNTER — Other Ambulatory Visit: Payer: Self-pay | Admitting: Neurology

## 2021-11-03 DIAGNOSIS — G20 Parkinson's disease: Secondary | ICD-10-CM

## 2021-11-06 ENCOUNTER — Other Ambulatory Visit: Payer: Self-pay

## 2021-11-06 DIAGNOSIS — G20 Parkinson's disease: Secondary | ICD-10-CM

## 2021-11-06 MED ORDER — CARBIDOPA-LEVODOPA 25-100 MG PO TABS: 1.0000 | ORAL_TABLET | Freq: Three times a day (TID) | ORAL | 0 refills | Status: DC

## 2021-11-10 ENCOUNTER — Other Ambulatory Visit: Payer: Self-pay | Admitting: Neurology

## 2021-11-10 DIAGNOSIS — G20 Parkinson's disease: Secondary | ICD-10-CM

## 2021-11-17 ENCOUNTER — Ambulatory Visit (INDEPENDENT_AMBULATORY_CARE_PROVIDER_SITE_OTHER): Payer: Medicare Other | Admitting: Podiatry

## 2021-11-17 ENCOUNTER — Encounter: Payer: Self-pay | Admitting: Podiatry

## 2021-11-17 DIAGNOSIS — M79674 Pain in right toe(s): Secondary | ICD-10-CM

## 2021-11-17 DIAGNOSIS — E119 Type 2 diabetes mellitus without complications: Secondary | ICD-10-CM | POA: Diagnosis not present

## 2021-11-17 DIAGNOSIS — M79675 Pain in left toe(s): Secondary | ICD-10-CM | POA: Diagnosis not present

## 2021-11-17 DIAGNOSIS — B351 Tinea unguium: Secondary | ICD-10-CM

## 2021-11-17 NOTE — Progress Notes (Signed)
This patient returns to my office for at risk foot care.  This patient requires this care by a professional since this patient will be at risk due to having type 1 diabetes. He presents to the office with his son.  This patient is unable to cut nails himself since the patient cannot reach his nails.These nails are painful walking and wearing shoes.  This patient presents for at risk foot care today.  General Appearance  Alert, conversant and in no acute stress.  Vascular  Dorsalis pedis and posterior tibial  pulses are palpable  Right.  Weakly palpable pulses left foot..  Capillary return is within normal limits  bilaterally. Temperature is within normal limits  bilaterally.  Neurologic  Senn-Weinstein monofilament wire test within normal limits  bilaterally. Muscle power within normal limits bilaterally.  Nails Thick disfigured discolored nails with subungual debris  from hallux to fifth toes bilaterally. No evidence of bacterial infection or drainage bilaterally.  Orthopedic  No limitations of motion  feet .  No crepitus or effusions noted.  No bony pathology or digital deformities noted.  Skin  normotropic skin with no porokeratosis noted bilaterally.  No signs of infections or ulcers noted.     Onychomycosis  Pain in right toes  Pain in left toes  Consent was obtained for treatment procedures.   Mechanical debridement of nails 1-5  bilaterally performed with a nail nipper.  Filed with dremel without incident.    Return office visit 3 months                     Told patient to return for periodic foot care and evaluation due to potential at risk complications.   Esma Kilts DPM  

## 2021-12-29 DIAGNOSIS — E119 Type 2 diabetes mellitus without complications: Secondary | ICD-10-CM | POA: Diagnosis not present

## 2021-12-29 DIAGNOSIS — I10 Essential (primary) hypertension: Secondary | ICD-10-CM | POA: Diagnosis not present

## 2021-12-29 DIAGNOSIS — H04123 Dry eye syndrome of bilateral lacrimal glands: Secondary | ICD-10-CM | POA: Diagnosis not present

## 2022-01-01 DIAGNOSIS — M1711 Unilateral primary osteoarthritis, right knee: Secondary | ICD-10-CM | POA: Diagnosis not present

## 2022-01-05 DIAGNOSIS — M1711 Unilateral primary osteoarthritis, right knee: Secondary | ICD-10-CM | POA: Diagnosis not present

## 2022-01-12 DIAGNOSIS — M1711 Unilateral primary osteoarthritis, right knee: Secondary | ICD-10-CM | POA: Diagnosis not present

## 2022-01-19 DIAGNOSIS — M1711 Unilateral primary osteoarthritis, right knee: Secondary | ICD-10-CM | POA: Diagnosis not present

## 2022-02-02 DIAGNOSIS — G20A1 Parkinson's disease without dyskinesia, without mention of fluctuations: Secondary | ICD-10-CM | POA: Diagnosis not present

## 2022-02-02 DIAGNOSIS — E1169 Type 2 diabetes mellitus with other specified complication: Secondary | ICD-10-CM | POA: Diagnosis not present

## 2022-02-02 DIAGNOSIS — I10 Essential (primary) hypertension: Secondary | ICD-10-CM | POA: Diagnosis not present

## 2022-02-02 DIAGNOSIS — E7800 Pure hypercholesterolemia, unspecified: Secondary | ICD-10-CM | POA: Diagnosis not present

## 2022-02-05 ENCOUNTER — Other Ambulatory Visit: Payer: Self-pay | Admitting: Neurology

## 2022-02-05 DIAGNOSIS — G20A1 Parkinson's disease without dyskinesia, without mention of fluctuations: Secondary | ICD-10-CM

## 2022-02-19 NOTE — Progress Notes (Signed)
Assessment/Plan:   1.  Parkinsons Disease  -Continue carbidopa/levodopa 25/100, 1 tablet 3 times per day.  Levodopa is taking at strange times a day because of son's work schedule.  -he is seeing dermatology  2.  Dementia  -Had neurocognitive testing in June, 2021 with evidence of dementia, both parkinsonism and Alzheimer's.  -he's not driving and confirmed that.  -Continue donepezil, 10 mg daily.  -sleep schedule continues to be an issue.  Has day/night reversal.  Son works nights and pt awakens multiple times in middle of night to eat and sleeps in day.  Will try adding low dose seroquel at night, 25 mg.  Discussed r/b/se.  May help sleep, day/night reversal and confusion.  3.  B12 deficiency  -Continue over-the-counter B12  4.  ? TGA  -Patient had an episode in July, 2022 where he had trouble remembering the people around him.  Patient does have baseline dementia, so difficult exactly to say what this was.  CT brain was negative.  Subjective:   Christian Morgan was seen today in follow up for parkinsonism.  My previous records were reviewed prior to todays visit as well as outside records available to me.  Patient is with his son who supplements the history.  Pt has been to physical therapy since our last visit.  Last physical therapy was in July.  He has had no falls.  No hallucinations.  No near syncope.   Son notes more memory change and sleeping more in the afternoon.  Goes to bed at 8-9pm.  Gets up at midnight, has almond milk.  Gets up at 3 am and has 3 sugar free cookies and almond milk.  He gets up then at 7am and then 8am for breakfast and goes back to bed.  Blood sugars running 108-126 per son (rarely 151).  Son just got bed alarm but hasn't installed it yet.  Current prescribed movement disorder medications: Carbidopa/levodopa 25/100, 1 tablet 3 times per day  Donepezil, 10 mg daily  B12 supplement  PREVIOUS MEDICATIONS: Sinemet  ALLERGIES:   Allergies  Allergen  Reactions   Lipitor [Atorvastatin] Other (See Comments)    Increased liver enzymes and joint pain from numerous statins   Penicillins Hives and Rash    Did it involve swelling of the face/tongue/throat, SOB, or low BP? No Did it involve sudden or severe rash/hives, skin peeling, or any reaction on the inside of your mouth or nose? No Did you need to seek medical attention at a hospital or doctor's office? yes When did it last happen?       30 years If all above answers are "NO", may proceed with cephalosporin use.   Amitriptyline Hcl     Unknown reaction   Cyclobenzaprine     Burning in stomach   Gabapentin     Dizziness    Other Other (See Comments)    [place]: "Terrible migraines."   Zetia [Ezetimibe]     Joint pain   Aspirin Other (See Comments)    Upset stomach   Doxycycline Hives    Reaction took 4-5 days   Iodinated Contrast Media Hives    Developed hives after contrast "50 years ago when I had a subdural hematoma" at age 76.  Tolerates now with Benadryl 50mg  PO one hour before contrast.   Levofloxacin Hives    Delayed allergic reaction    CURRENT MEDICATIONS:  Outpatient Encounter Medications as of 03/03/2022  Medication Sig   acetaminophen (TYLENOL) 500  MG tablet Take 500 mg by mouth every 6 (six) hours as needed for moderate pain or headache.   carbidopa-levodopa (SINEMET IR) 25-100 MG tablet TAKE 1 TABLET BY MOUTH THREE TIMES A DAY   carbidopa-levodopa (SINEMET IR) 25-100 MG tablet TAKE 1 TABLET BY MOUTH THREE TIMES A DAY   Coconut Oil 1000 MG CAPS Take by mouth.   donepezil (ARICEPT) 10 MG tablet TAKE 1 TABLET BY MOUTH EVERYDAY AT BEDTIME   escitalopram (LEXAPRO) 10 MG tablet Take 5 mg by mouth daily.    levocetirizine (XYZAL) 5 MG tablet Take 5 mg by mouth every evening.   loratadine (CLARITIN) 10 MG tablet Take 10 mg by mouth daily.   losartan (COZAAR) 100 MG tablet Take 100 mg by mouth daily.   metFORMIN (GLUCOPHAGE-XR) 500 MG 24 hr tablet Take 1,000 mg by mouth 2  (two) times daily.   rosuvastatin (CRESTOR) 5 MG tablet Take 5 mg by mouth daily.   Turmeric 500 MG TABS 2 tablets   vitamin B-12 (CYANOCOBALAMIN) 1000 MCG tablet Take 1,000 mcg by mouth daily.   [DISCONTINUED] Light Mineral Oil-Mineral Oil (RETAINE MGD OP) Place 1 drop into the right eye daily as needed (watery eye). (Patient not taking: Reported on 11/17/2021)   [DISCONTINUED] triamcinolone acetonide (KENALOG-40) 40 MG/ML injection Inject 2 mg into the muscle once. (Patient not taking: Reported on 08/29/2021)   No facility-administered encounter medications on file as of 03/03/2022.    Objective:   PHYSICAL EXAMINATION:    VITALS:   Vitals:   03/03/22 0915  BP: (!) 151/72  Pulse: 67  SpO2: 97%  Weight: 224 lb 12.8 oz (102 kg)  Height: 5\' 6"  (1.676 m)    GEN:  The patient appears stated age and is in NAD. HEENT:  Normocephalic, atraumatic.  The mucous membranes are moist.   Neurological examination:  Orientation: The patient is alert and oriented x2. Cranial nerves: There is good facial symmetry with mild facial hypomimia. The speech is fluent and clear. Soft palate rises symmetrically and there is no tongue deviation. Hearing is decreased to conversational tone. Sensation: Sensation is intact to light touch throughout Motor: Strength is at least antigravity x4.  Movement examination: Tone: There is normal tone today in the UE/LE Abnormal movements: there is mild RUE rest tremor, worse with ambulation (same as previous) Coordination:  There is good RAMs today, without decremation. Gait and Station: The patient pushes off to arise. The patient's stride length is decreased with RUE rest tremor.  He shuffles and is forward flexed.  The R leg drags with ambulation.  This is same as previous  I have reviewed and interpreted the following labs independently    Chemistry      Component Value Date/Time   NA 139 01/11/2019 0649   K 4.0 01/11/2019 0649   CL 106 01/11/2019 0649    CO2 24 01/11/2019 0649   BUN 23 01/11/2019 0649   CREATININE 1.06 01/11/2019 0649      Component Value Date/Time   CALCIUM 9.2 01/11/2019 0649       Lab Results  Component Value Date   WBC 7.2 01/11/2019   HGB 12.3 (L) 01/11/2019   HCT 37.9 (L) 01/11/2019   MCV 94.3 01/11/2019   PLT 257 01/11/2019    No results found for: "TSH"  Total time spent on today's visit was 25 minutes, including both face-to-face time and nonface-to-face time.  Time included that spent on review of records (prior notes available to me/labs/imaging if  pertinent), discussing treatment and goals, answering patient's questions and coordinating care.   Cc:  [name], MD

## 2022-02-20 ENCOUNTER — Ambulatory Visit: Payer: Medicare Other | Admitting: Podiatry

## 2022-02-20 ENCOUNTER — Encounter: Payer: Self-pay | Admitting: Podiatry

## 2022-02-20 DIAGNOSIS — M79674 Pain in right toe(s): Secondary | ICD-10-CM | POA: Diagnosis not present

## 2022-02-20 DIAGNOSIS — B351 Tinea unguium: Secondary | ICD-10-CM | POA: Diagnosis not present

## 2022-02-20 DIAGNOSIS — E119 Type 2 diabetes mellitus without complications: Secondary | ICD-10-CM | POA: Diagnosis not present

## 2022-02-20 DIAGNOSIS — M79675 Pain in left toe(s): Secondary | ICD-10-CM

## 2022-02-20 NOTE — Progress Notes (Signed)
This patient returns to my office for at risk foot care.  This patient requires this care by a professional since this patient will be at risk due to having type 1 diabetes. He presents to the office with his son.  This patient is unable to cut nails himself since the patient cannot reach his nails.These nails are painful walking and wearing shoes.  This patient presents for at risk foot care today.  General Appearance  Alert, conversant and in no acute stress.  Vascular  Dorsalis pedis and posterior tibial  pulses are palpable  Right.  Weakly palpable pulses left foot..  Capillary return is within normal limits  bilaterally. Temperature is within normal limits  bilaterally.  Neurologic  Senn-Weinstein monofilament wire test within normal limits  bilaterally. Muscle power within normal limits bilaterally.  Nails Thick disfigured discolored nails with subungual debris  from hallux to fifth toes bilaterally. No evidence of bacterial infection or drainage bilaterally.  Orthopedic  No limitations of motion  feet .  No crepitus or effusions noted.  No bony pathology or digital deformities noted.  Skin  normotropic skin with no porokeratosis noted bilaterally.  No signs of infections or ulcers noted.     Onychomycosis  Pain in right toes  Pain in left toes  Consent was obtained for treatment procedures.   Mechanical debridement of nails 1-5  bilaterally performed with a nail nipper.  Filed with dremel without incident.    Return office visit 3 months                     Told patient to return for periodic foot care and evaluation due to potential at risk complications.   Dajon Lazar DPM  

## 2022-03-03 ENCOUNTER — Encounter: Payer: Self-pay | Admitting: Neurology

## 2022-03-03 ENCOUNTER — Ambulatory Visit: Payer: Medicare Other | Admitting: Neurology

## 2022-03-03 VITALS — BP 151/72 | HR 67 | Ht 66.0 in | Wt 224.8 lb

## 2022-03-03 DIAGNOSIS — F0390 Unspecified dementia without behavioral disturbance: Secondary | ICD-10-CM

## 2022-03-03 DIAGNOSIS — G20A1 Parkinson's disease without dyskinesia, without mention of fluctuations: Secondary | ICD-10-CM

## 2022-03-03 MED ORDER — QUETIAPINE FUMARATE 25 MG PO TABS: 25.0000 mg | ORAL_TABLET | Freq: Every day | ORAL | 1 refills | Status: DC

## 2022-03-14 ENCOUNTER — Ambulatory Visit (HOSPITAL_COMMUNITY)
Admission: EM | Admit: 2022-03-14 | Discharge: 2022-03-14 | Disposition: A | Discharge status: Home / Self Care | Payer: Medicare Other | Attending: Physician Assistant | Admitting: Physician Assistant

## 2022-03-14 ENCOUNTER — Encounter (HOSPITAL_COMMUNITY): Payer: Self-pay | Admitting: Emergency Medicine

## 2022-03-14 DIAGNOSIS — R310 Gross hematuria: Secondary | ICD-10-CM | POA: Diagnosis not present

## 2022-03-14 LAB — POCT URINALYSIS DIPSTICK, ED / UC
Bilirubin Urine: NEGATIVE
Glucose, UA: NEGATIVE mg/dL
Ketones, ur: NEGATIVE mg/dL
Leukocytes,Ua: NEGATIVE
Nitrite: NEGATIVE
Protein, ur: 30 mg/dL — AB
Specific Gravity, Urine: >=1.03 (ref 1.005–1.030)
Urobilinogen, UA: 1 mg/dL (ref 0.0–1.0)
pH: 5.5 (ref 5.0–8.0)

## 2022-03-14 NOTE — ED Triage Notes (Signed)
Pt presents with son.  Son reports noticing blood in his urine last night and again this morning. Also reports urinary frequency.

## 2022-03-14 NOTE — Discharge Instructions (Addendum)
Your urine showed blood but did not have any evidence of infection.  We will contact you if your culture is positive within the next several days and we need to start antibiotics.  We have drawn some basic labs.  I will contact you if these are abnormal.  Please monitor your MyChart for these results.  It is importantly follow-up with urologist for further evaluation and management.  Please call them to schedule an appointment.  If anything changes or worsens and you develop recurrent blood in your urine, abdominal pain, pelvic pain, fever, nausea, vomiting you should be seen immediately.

## 2022-03-14 NOTE — ED Provider Notes (Signed)
[facility]    CSN: [id number] Arrival date & time: 03/14/22  1127      History   Chief Complaint Chief Complaint  Patient presents with   Hematuria    HPI Bayden Gil Prateek Knipple is a 82 y.o. male.   Patient presents today companied by his son who provided history.  Reports that his son noticed while doing laundry that there was some blood mixed with urine in his underwear.  Unsure when this occurred.  Does report that he had an episode of hematuria earlier today but since then has had 2 additional urinations without any evidence of blood.  Denies any significant dysuria but does report some frequency.  Denies history of nephrolithiasis or recurrent kidney infections.  Denies any fever, nausea, vomiting, weakness.  He does not take an SGLT2 inhibitor.  Does not take any blood thinning medication and denies personal or family history of bleeding disorder.    Past Medical History:  Diagnosis Date   Allergic rhinitis    Chronic headache    Complication of anesthesia    after eye surgery, had trouble catching breath   Daytime somnolence 01/05/2017   Essential hypertension 10/31/2009   Hard of hearing    Hyperglycemia due to diabetes mellitus 09/20/2017   Hyperlipidemia    Hypertension    Major neurocognitive disorder, unclear etiology 08/02/2019   OSA (obstructive sleep apnea) 2011   s/p septoplasty; was supposed to wear cpap but better after septoplasty   Type I diabetes mellitus 10/31/2009    Patient Active Problem List   Diagnosis Date Noted   Altered mental status 05/21/2021   Anxiety 08/07/2020   Aortic valve disorder 08/07/2020   Atopic dermatitis 08/07/2020   Benign prostatic hyperplasia 08/07/2020   Constipation 08/07/2020   Dementia (HCC) 08/07/2020   ED (erectile dysfunction) of organic origin 08/07/2020   Moderate recurrent major depression (HCC) 08/07/2020   Morbid obesity (HCC) 08/07/2020   Nephrolithiasis 08/07/2020   Memory loss 08/07/2020    Overweight 08/07/2020   Parkinson's disease 08/07/2020   Insomnia 08/07/2020   Postherpetic neuralgia 08/07/2020   Pure hypercholesterolemia 08/07/2020   Recurrent major depression in remission (HCC) 08/07/2020   Pain due to onychomycosis of toenails of both feet 01/03/2020   Diabetes mellitus without complication (HCC) 01/03/2020   Major neurocognitive disorder, unclear etiology 08/02/2019   Hyperglycemia due to diabetes mellitus 09/20/2017   Daytime somnolence 01/05/2017   Type I diabetes mellitus (HCC) 10/31/2009   Hyperlipidemia 10/31/2009   Obstructive sleep apnea 10/31/2009   Essential hypertension 10/31/2009   Chronic rhinitis 10/31/2009    Past Surgical History:  Procedure Laterality Date   BACK SURGERY     COLONOSCOPY     EYE SURGERY Bilateral    cataract surgery with lens implants   NASAL SEPTOPLASTY W/ TURBINOPLASTY Bilateral 01/11/2019   Procedure: NASAL SEPTOPLASTY;  Surgeon: Newman Pies, MD;  Location: [facility] OR;  Service: ENT;  Laterality: Bilateral;   S/p Crani for CHI     subdural hematoma    SHOULDER SURGERY  2009   Right   TURBINATE REDUCTION Bilateral 01/11/2019   Procedure: Turbinate Reduction;  Surgeon: Newman Pies, MD;  Location: [facility];  Service: ENT;  Laterality: Bilateral;       Home Medications    Prior to Admission medications   Medication Sig Start Date End Date Taking? Authorizing Provider  acetaminophen (TYLENOL) 500 MG tablet Take 500 mg by mouth every 6 (six) hours as needed for moderate pain or  headache.    [provider]  carbidopa-levodopa (SINEMET IR) 25-100 MG tablet TAKE 1 TABLET BY MOUTH THREE TIMES A DAY 11/06/21   Tat, Eustace Quail, DO  carbidopa-levodopa (SINEMET IR) 25-100 MG tablet TAKE 1 TABLET BY MOUTH THREE TIMES A DAY 02/05/22   Tat, Eustace Quail, DO  Coconut Oil 1000 MG CAPS Take by mouth.    [provider]  donepezil (ARICEPT) 10 MG tablet TAKE 1 TABLET BY MOUTH EVERYDAY AT BEDTIME 11/10/21   Tat, Rebecca S, DO   escitalopram (LEXAPRO) 10 MG tablet Take 5 mg by mouth daily.  12/08/19   [provider]  levocetirizine (XYZAL) 5 MG tablet Take 5 mg by mouth every evening.    [provider]  loratadine (CLARITIN) 10 MG tablet Take 10 mg by mouth daily.    [provider]  losartan (COZAAR) 100 MG tablet Take 100 mg by mouth daily.    [provider]  metFORMIN (GLUCOPHAGE-XR) 500 MG 24 hr tablet Take 1,000 mg by mouth 2 (two) times daily.    [provider]  QUEtiapine (SEROQUEL) 25 MG tablet Take 1 tablet (25 mg total) by mouth at bedtime. 03/03/22   Tat, Eustace Quail, DO  rosuvastatin (CRESTOR) 5 MG tablet Take 5 mg by mouth daily.    [provider]  Turmeric 500 MG TABS 2 tablets    [provider]  vitamin B-12 (CYANOCOBALAMIN) 1000 MCG tablet Take 1,000 mcg by mouth daily.    [provider]    Family History Family History  Problem Relation Age of Onset   Stroke Sister    Lung cancer Brother    Diabetes Mother    Depression Mother    Alcohol abuse Father     Social History Social History   Tobacco Use   Smoking status: Former    Packs/day: 0.10    Types: Cigarettes, Cigars    Quit date: 03/02/2009    Years since quitting: 13.0   Smokeless tobacco: Never   Tobacco comments:    smoked an occ cigarette "for a very short time" as a teenager.  Then started smoking cigars- only occassional starting at age 23s.   Vaping Use   Vaping Use: Never used  Substance Use Topics   Alcohol use: Not Currently    Comment: rarely   Drug use: No     Allergies   Lipitor [atorvastatin], Penicillins, Amitriptyline hcl, Cyclobenzaprine, Gabapentin, Other, Zetia [ezetimibe], Aspirin, Doxycycline, Iodinated contrast media, and Levofloxacin   Review of Systems Review of Systems  Constitutional:  Negative for activity change, appetite change, fatigue and fever.  Respiratory:  Negative for cough and shortness of breath.    Cardiovascular:  Negative for chest pain.  Gastrointestinal:  Negative for abdominal pain, diarrhea, nausea and vomiting.  Genitourinary:  Positive for frequency and hematuria. Negative for dysuria, flank pain and urgency.     Physical Exam Triage Vital Signs ED Triage Vitals [03/14/22 1313]  Enc Vitals Group     BP (!) 157/81     Pulse Rate 62     Resp 18     Temp 97.8 F (36.6 C)     Temp Source Oral     SpO2 98 %     Weight      Height      Head Circumference      Peak Flow      Pain Score      Pain Loc      Pain  Edu?      Excl. in GC?    No data found.  Updated Vital Signs BP (!) 157/81 (BP Location: Right Arm)   Pulse 62   Temp 97.8 F (36.6 C) (Oral)   Resp 18   SpO2 98%   Visual Acuity Right Eye Distance:   Left Eye Distance:   Bilateral Distance:    Right Eye Near:   Left Eye Near:    Bilateral Near:     Physical Exam Vitals reviewed.  Constitutional:      General: He is awake.     Appearance: Normal appearance. He is well-developed. He is not ill-appearing.     Comments: Very pleasant male appears stated age in no acute distress sitting comfortably in exam room  HENT:     Head: Normocephalic and atraumatic.     Mouth/Throat:     Pharynx: Uvula midline. No oropharyngeal exudate or posterior oropharyngeal erythema.  Cardiovascular:     Rate and Rhythm: Normal rate and regular rhythm.     Heart sounds: S1 normal and S2 normal. Murmur heard.  Pulmonary:     Effort: Pulmonary effort is normal.     Breath sounds: Normal breath sounds. No stridor. No wheezing, rhonchi or rales.     Comments: Clear to auscultation bilaterally Abdominal:     General: Bowel sounds are normal.     Palpations: Abdomen is soft.     Tenderness: There is no abdominal tenderness. There is no right CVA tenderness, left CVA tenderness, guarding or rebound.     Comments: Benign abdominal exam  Musculoskeletal:     Comments: Resting tremor noted of right upper extremity   Neurological:     Mental Status: He is alert.  Psychiatric:        Behavior: Behavior is cooperative.      UC Treatments / Results  Labs (all labs ordered are listed, but only abnormal results are displayed) Labs Reviewed  POCT URINALYSIS DIPSTICK, ED / UC - Abnormal; Notable for the following components:      Result Value   Hgb urine dipstick LARGE (*)    Protein, ur 30 (*)    All other components within normal limits  URINE CULTURE  CBC WITH DIFFERENTIAL/PLATELET  COMPREHENSIVE METABOLIC PANEL  PROTIME-INR  APTT    EKG   Radiology No results found.  Procedures Procedures (including critical care time)  Medications Ordered in UC Medications - No data to display  Initial Impression / Assessment and Plan / UC Course  I have reviewed the triage vital signs and the nursing notes.  Pertinent labs & imaging results that were available during my care of the patient were reviewed by me and considered in my medical decision making (see chart for details).     Patient is well-appearing, afebrile, nontoxic, nontachycardic.  Vital signs and physical exam are reassuring today with no indication for emergent evaluation or imaging.  UA did show hematuria but no evidence of infection.  Will send this for culture but defer antibiotics until culture results are available.  Basic labs including CBC, CMP, coag studies were obtained today-results pending.  Will make additional recommendations based laboratory findings.  Discussed that ultimately patient will need to be seen by urology for further evaluation and management was given contact information for local provider in the area with instruction to call to schedule an appointment first thing Monday.  Also recommended close follow-up with primary care.  Discussed that if he has any worsening or changing  symptoms including recurrent frank hematuria, abdominal pain, pelvic pain, fever, nausea, vomiting, widespread edema he needs to be seen  immediately.  Strict return precautions given to which patient and son expressed understanding.  Final Clinical Impressions(s) / UC Diagnoses   Final diagnoses:  Gross hematuria     Discharge Instructions      Your urine showed blood but did not have any evidence of infection.  We will contact you if your culture is positive within the next several days and we need to start antibiotics.  We have drawn some basic labs.  I will contact you if these are abnormal.  Please monitor your MyChart for these results.  It is importantly follow-up with urologist for further evaluation and management.  Please call them to schedule an appointment.  If anything changes or worsens and you develop recurrent blood in your urine, abdominal pain, pelvic pain, fever, nausea, vomiting you should be seen immediately.     ED Prescriptions   None    PDMP not reviewed this encounter.   Terrilee Croak, PA-C 03/14/22 1343

## 2022-03-15 LAB — URINE CULTURE: Culture: NO GROWTH

## 2022-03-17 DIAGNOSIS — R319 Hematuria, unspecified: Secondary | ICD-10-CM | POA: Diagnosis not present

## 2022-04-01 DIAGNOSIS — R310 Gross hematuria: Secondary | ICD-10-CM | POA: Diagnosis not present

## 2022-04-01 DIAGNOSIS — N200 Calculus of kidney: Secondary | ICD-10-CM | POA: Diagnosis not present

## 2022-04-04 ENCOUNTER — Other Ambulatory Visit: Payer: Self-pay

## 2022-04-04 ENCOUNTER — Emergency Department (HOSPITAL_BASED_OUTPATIENT_CLINIC_OR_DEPARTMENT_OTHER)
Admission: EM | Admit: 2022-04-04 | Discharge: 2022-04-04 | Disposition: A | Discharge status: Home / Self Care | Payer: Medicare Other | Attending: Emergency Medicine | Admitting: Emergency Medicine

## 2022-04-04 ENCOUNTER — Emergency Department (HOSPITAL_BASED_OUTPATIENT_CLINIC_OR_DEPARTMENT_OTHER): Payer: Medicare Other

## 2022-04-04 ENCOUNTER — Encounter (HOSPITAL_BASED_OUTPATIENT_CLINIC_OR_DEPARTMENT_OTHER): Payer: Self-pay

## 2022-04-04 DIAGNOSIS — F0390 Unspecified dementia without behavioral disturbance: Secondary | ICD-10-CM | POA: Insufficient documentation

## 2022-04-04 DIAGNOSIS — M25561 Pain in right knee: Secondary | ICD-10-CM | POA: Insufficient documentation

## 2022-04-04 DIAGNOSIS — W01190A Fall on same level from slipping, tripping and stumbling with subsequent striking against furniture, initial encounter: Secondary | ICD-10-CM | POA: Insufficient documentation

## 2022-04-04 DIAGNOSIS — Y9301 Activity, walking, marching and hiking: Secondary | ICD-10-CM | POA: Insufficient documentation

## 2022-04-04 NOTE — ED Triage Notes (Signed)
Patient here POV from Home.  Endorses Fall today approximately 5 hours ago. Was ambulating when he went through the Door and tripped. Fell onto his Right Knee. No other Injuries noted.   500 mg Tylenol at 1800.  NAD Noted during Triage. A&Ox4. GCS 15. BIB Wheelchair.

## 2022-04-04 NOTE — Discharge Instructions (Addendum)
You were seen today for right-sided knee pain after a fall.  Your workup was grossly reassuring.  Please follow-up with orthopedics as we discussed.

## 2022-04-04 NOTE — ED Provider Notes (Signed)
Christian Morgan   CSN: [id number] Arrival date & time: 04/04/22  2024     History Chief Complaint  Patient presents with   Fall    HPI Christian Morgan Christian Morgan is a 82 y.o. male presenting for fall.  He has dementia and lives at home with his son who assist with ADLs.  Patient tripped walking through a door hitting his foot on the doorstep.  He fell onto his right knee and has been having pain with ambulation since.  Denies any hip pain or ankle pain only pain localized to his right knee where he has a history of arthritis. Localized swelling appreciated but patient denies fevers or chills, nausea vomiting, syncope shortness of breath.   Patient's recorded medical, surgical, social, medication list and allergies were reviewed in the Snapshot window as part of the initial history.   Review of Systems   Review of Systems  Constitutional:  Negative for chills and fever.  HENT:  Negative for ear pain and sore throat.   Eyes:  Negative for pain and visual disturbance.  Respiratory:  Negative for cough and shortness of breath.   Cardiovascular:  Negative for chest pain and palpitations.  Gastrointestinal:  Negative for abdominal pain and vomiting.  Genitourinary:  Negative for dysuria and hematuria.  Musculoskeletal:  Negative for arthralgias and back pain.  Skin:  Negative for color change and rash.  Neurological:  Negative for seizures and syncope.  All other systems reviewed and are negative.   Physical Exam Updated Vital Signs BP (!) 147/83 (BP Location: Right Arm)   Pulse 87   Temp 97.7 F (36.5 C) (Temporal)   Resp 18   Ht 5\' 6"  (1.676 m)   Wt 102 kg   SpO2 100%   BMI 36.29 kg/m  Physical Exam Vitals and nursing Morgan reviewed.  Constitutional:      General: He is not in acute distress.    Appearance: He is well-developed.  HENT:     Head: Normocephalic and atraumatic.  Eyes:     Conjunctiva/sclera: Conjunctivae  normal.  Cardiovascular:     Rate and Rhythm: Normal rate and regular rhythm.     Heart sounds: No murmur heard. Pulmonary:     Effort: Pulmonary effort is normal. No respiratory distress.     Breath sounds: Normal breath sounds.  Abdominal:     Palpations: Abdomen is soft.     Tenderness: There is no abdominal tenderness.  Musculoskeletal:        General: Tenderness (Tenderness to palpation of the right knee joint tolerating passive motion.) present. No swelling.     Cervical back: Neck supple.  Skin:    General: Skin is warm and dry.     Capillary Refill: Capillary refill takes less than 2 seconds.  Neurological:     Mental Status: He is alert.  Psychiatric:        Mood and Affect: Mood normal.      ED Course/ Medical Decision Making/ A&P    Procedures Procedures   Medications Ordered in ED Medications - No data to display Medical Decision Making:    Christian Morgan is a 82 y.o. male who presented to the ED today with a moderate mechanisma trauma, detailed above.    Additional history discussed with patient's family/caregivers.  Patient placed on continuous vitals and telemetry monitoring while in ED which was reviewed periodically.   Given this mechanism of trauma, a full physical  exam was performed. Notably, patient was HDS in NAD.   Reviewed and confirmed nursing documentation for past medical history, family history, social history.    Initial Assessment/Plan:   This is a patient presenting with a moderate mechanism trauma.  As such, I have considered intracranial injuries including intracranial hemorrhage, intrathoracic injuries including blunt myocardial or blunt lung injury, blunt abdominal injuries including aortic dissection, bladder injury, spleen injury, liver injury and I have considered orthopedic injuries including extremity or spinal injury.  With the patient's presentation of moderate mechanism trauma but an otherwise reassuring exam, patient  warrants targeted evaluation for potential traumatic injuries. Will proceed with targeted evaluation for potential injuries. Will proceed with right knee XR. Objective evaluation resulted with NAA.   Disposition:  I have considered need for hospitalization, however, considering all of the above, I believe this patient is stable for discharge at this time.  Patient/family educated about specific return precautions for given chief complaint and symptoms.  Patient/family educated about follow-up with PCP/Ortho.     Patient/family expressed understanding of return precautions and need for follow-up. Patient spoken to regarding all imaging and laboratory results and appropriate follow up for these results. All education provided in verbal form with additional information in written form. Time was allowed for answering of patient questions. Patient discharged.    Emergency Department Medication Summary:   Medications - No data to display         Clinical Impression:  1. Acute pain of right knee      Discharge   Final Clinical Impression(s) / ED Diagnoses Final diagnoses:  Acute pain of right knee    Rx / DC Orders ED Discharge Orders     None         Tretha Sciara, MD 04/04/22 2133

## 2022-04-06 DIAGNOSIS — M1711 Unilateral primary osteoarthritis, right knee: Secondary | ICD-10-CM | POA: Diagnosis not present

## 2022-04-22 DIAGNOSIS — R310 Gross hematuria: Secondary | ICD-10-CM | POA: Diagnosis not present

## 2022-04-25 ENCOUNTER — Other Ambulatory Visit: Payer: Self-pay | Admitting: Neurology

## 2022-04-25 DIAGNOSIS — G20A1 Parkinson's disease without dyskinesia, without mention of fluctuations: Secondary | ICD-10-CM

## 2022-05-04 ENCOUNTER — Other Ambulatory Visit: Payer: Self-pay | Admitting: Neurology

## 2022-05-04 DIAGNOSIS — G20A1 Parkinson's disease without dyskinesia, without mention of fluctuations: Secondary | ICD-10-CM

## 2022-05-14 ENCOUNTER — Ambulatory Visit: Payer: Medicare Other | Attending: Internal Medicine | Admitting: Physical Therapy

## 2022-05-14 DIAGNOSIS — R2681 Unsteadiness on feet: Secondary | ICD-10-CM | POA: Insufficient documentation

## 2022-05-14 NOTE — Therapy (Signed)
[facility] [address] [place], [place], [address] Phone: [phone]   Fax:  [phone]  Patient Details  Name: Christian Morgan MRN: [id number] Date of Birth: 1940-07-08 Referring Provider:  Seward Carol, MD  Encounter Date: 05/14/2022  Physical Therapy Parkinson's Disease Screen   Timed Up and Go test: 17 seconds (previously 14 seconds)   10 meter walk test:20.75 seconds = 1.58 ft/sec with no AD (previously 2.34 ft/sec)  5 time sit to stand test:17.7 seconds (previously 13.3 seconds)  Now using a SPC. Had a fall in February, didn't pick his foot up all the way on a step and came down really slow and hit his knee. Did not hit his head. Did not get a recumbent bike. Has not been doing his exercises - did it for a week and a half and then stopped doing them. Have been walking as much as he is able.  Reports his arthritic knee bothers him. Reports pt has not noticed any slowing down.   Pt is with son and provides history. Pt's son works at night and pt has odd sleeping schedule. Educated on trying to work on walking more at home with son when able and to try to work on his exercises/sit<> stands as much as he can. Due to cognitive deficits, pt needs his son to help with his exercises. Pt's son also to look again at [employer] sports for a recumbent bike. Pt's son reports that they have a seated stepper (feet only), but pt does not want to use it.   Patient does not require Physical Therapy services at this time.  Recommend Physical Therapy screen in 6 MONTHS.     Arliss Journey, PT, DPT  05/14/2022, 8:59 AM  [facility] [address] [place], [place], [address] Phone: [phone]   Fax:  [phone]

## 2022-05-25 ENCOUNTER — Ambulatory Visit: Payer: Medicare Other | Admitting: Podiatry

## 2022-05-25 ENCOUNTER — Encounter: Payer: Self-pay | Admitting: Podiatry

## 2022-05-25 DIAGNOSIS — B351 Tinea unguium: Secondary | ICD-10-CM

## 2022-05-25 DIAGNOSIS — M79674 Pain in right toe(s): Secondary | ICD-10-CM | POA: Diagnosis not present

## 2022-05-25 DIAGNOSIS — E119 Type 2 diabetes mellitus without complications: Secondary | ICD-10-CM

## 2022-05-25 DIAGNOSIS — M79675 Pain in left toe(s): Secondary | ICD-10-CM

## 2022-05-25 NOTE — Progress Notes (Signed)
This patient returns to my office for at risk foot care.  This patient requires this care by a professional since this patient will be at risk due to having type 1 diabetes. He presents to the office with his son.  This patient is unable to cut nails himself since the patient cannot reach his nails.These nails are painful walking and wearing shoes.  This patient presents for at risk foot care today.  General Appearance  Alert, conversant and in no acute stress.  Vascular  Dorsalis pedis and posterior tibial  pulses are palpable  Right.  Weakly palpable pulses left foot..  Capillary return is within normal limits  bilaterally. Temperature is within normal limits  bilaterally.  Neurologic  Senn-Weinstein monofilament wire test within normal limits  bilaterally. Muscle power within normal limits bilaterally.  Nails Thick disfigured discolored nails with subungual debris  from hallux to fifth toes bilaterally. No evidence of bacterial infection or drainage bilaterally.  Orthopedic  No limitations of motion  feet .  No crepitus or effusions noted.  No bony pathology or digital deformities noted.  Skin  normotropic skin with no porokeratosis noted bilaterally.  No signs of infections or ulcers noted.     Onychomycosis  Pain in right toes  Pain in left toes  Consent was obtained for treatment procedures.   Mechanical debridement of nails 1-5  bilaterally performed with a nail nipper.  Filed with dremel without incident.    Return office visit 3 months                     Told patient to return for periodic foot care and evaluation due to potential at risk complications.   Gardiner Barefoot DPM

## 2022-06-08 DIAGNOSIS — G20A1 Parkinson's disease without dyskinesia, without mention of fluctuations: Secondary | ICD-10-CM | POA: Diagnosis not present

## 2022-06-08 DIAGNOSIS — E1169 Type 2 diabetes mellitus with other specified complication: Secondary | ICD-10-CM | POA: Diagnosis not present

## 2022-06-08 DIAGNOSIS — E7800 Pure hypercholesterolemia, unspecified: Secondary | ICD-10-CM | POA: Diagnosis not present

## 2022-06-08 DIAGNOSIS — R319 Hematuria, unspecified: Secondary | ICD-10-CM | POA: Diagnosis not present

## 2022-06-08 DIAGNOSIS — I10 Essential (primary) hypertension: Secondary | ICD-10-CM | POA: Diagnosis not present

## 2022-06-08 DIAGNOSIS — E119 Type 2 diabetes mellitus without complications: Secondary | ICD-10-CM | POA: Diagnosis not present

## 2022-06-23 DIAGNOSIS — M1711 Unilateral primary osteoarthritis, right knee: Secondary | ICD-10-CM | POA: Diagnosis not present

## 2022-07-27 ENCOUNTER — Other Ambulatory Visit: Payer: Self-pay | Admitting: Neurology

## 2022-07-27 DIAGNOSIS — R4182 Altered mental status, unspecified: Secondary | ICD-10-CM

## 2022-07-27 DIAGNOSIS — G20A1 Parkinson's disease without dyskinesia, without mention of fluctuations: Secondary | ICD-10-CM

## 2022-07-27 DIAGNOSIS — F0390 Unspecified dementia without behavioral disturbance: Secondary | ICD-10-CM

## 2022-07-28 DIAGNOSIS — M1711 Unilateral primary osteoarthritis, right knee: Secondary | ICD-10-CM | POA: Diagnosis not present

## 2022-07-31 ENCOUNTER — Other Ambulatory Visit: Payer: Self-pay | Admitting: Internal Medicine

## 2022-07-31 ENCOUNTER — Other Ambulatory Visit: Payer: Self-pay | Admitting: Neurology

## 2022-07-31 DIAGNOSIS — G20A1 Parkinson's disease without dyskinesia, without mention of fluctuations: Secondary | ICD-10-CM | POA: Diagnosis not present

## 2022-07-31 DIAGNOSIS — R1310 Dysphagia, unspecified: Secondary | ICD-10-CM

## 2022-07-31 DIAGNOSIS — E119 Type 2 diabetes mellitus without complications: Secondary | ICD-10-CM | POA: Diagnosis not present

## 2022-08-04 DIAGNOSIS — M1711 Unilateral primary osteoarthritis, right knee: Secondary | ICD-10-CM | POA: Diagnosis not present

## 2022-08-11 DIAGNOSIS — M1711 Unilateral primary osteoarthritis, right knee: Secondary | ICD-10-CM | POA: Diagnosis not present

## 2022-08-14 ENCOUNTER — Ambulatory Visit
Admission: RE | Admit: 2022-08-14 | Discharge: 2022-08-14 | Disposition: A | Discharge status: Home / Self Care | Payer: Medicare Other | Source: Ambulatory Visit | Attending: Internal Medicine | Admitting: Internal Medicine

## 2022-08-14 ENCOUNTER — Other Ambulatory Visit: Payer: Medicare Other

## 2022-08-14 DIAGNOSIS — R1310 Dysphagia, unspecified: Secondary | ICD-10-CM

## 2022-08-27 ENCOUNTER — Encounter (HOSPITAL_COMMUNITY): Payer: Self-pay

## 2022-08-27 ENCOUNTER — Ambulatory Visit: Payer: Medicare Other | Admitting: Podiatry

## 2022-08-27 ENCOUNTER — Emergency Department (HOSPITAL_COMMUNITY): Payer: Medicare Other

## 2022-08-27 ENCOUNTER — Inpatient Hospital Stay (HOSPITAL_COMMUNITY)
Admission: EM | Admit: 2022-08-27 | Discharge: 2022-08-31 | Disposition: A | Discharge status: Home Health | Payer: Medicare Other | Attending: Internal Medicine | Admitting: Internal Medicine

## 2022-08-27 DIAGNOSIS — J309 Allergic rhinitis, unspecified: Secondary | ICD-10-CM | POA: Diagnosis present

## 2022-08-27 DIAGNOSIS — R531 Weakness: Secondary | ICD-10-CM | POA: Diagnosis not present

## 2022-08-27 DIAGNOSIS — Z743 Need for continuous supervision: Secondary | ICD-10-CM | POA: Diagnosis not present

## 2022-08-27 DIAGNOSIS — G9341 Metabolic encephalopathy: Secondary | ICD-10-CM | POA: Diagnosis present

## 2022-08-27 DIAGNOSIS — Z79899 Other long term (current) drug therapy: Secondary | ICD-10-CM | POA: Diagnosis not present

## 2022-08-27 DIAGNOSIS — F0283 Dementia in other diseases classified elsewhere, unspecified severity, with mood disturbance: Secondary | ICD-10-CM | POA: Diagnosis not present

## 2022-08-27 DIAGNOSIS — Z961 Presence of intraocular lens: Secondary | ICD-10-CM | POA: Diagnosis not present

## 2022-08-27 DIAGNOSIS — E785 Hyperlipidemia, unspecified: Secondary | ICD-10-CM | POA: Diagnosis present

## 2022-08-27 DIAGNOSIS — Z833 Family history of diabetes mellitus: Secondary | ICD-10-CM

## 2022-08-27 DIAGNOSIS — Z7984 Long term (current) use of oral hypoglycemic drugs: Secondary | ICD-10-CM | POA: Diagnosis not present

## 2022-08-27 DIAGNOSIS — R6889 Other general symptoms and signs: Secondary | ICD-10-CM | POA: Diagnosis not present

## 2022-08-27 DIAGNOSIS — R5381 Other malaise: Secondary | ICD-10-CM | POA: Diagnosis not present

## 2022-08-27 DIAGNOSIS — N1831 Chronic kidney disease, stage 3a: Secondary | ICD-10-CM | POA: Diagnosis not present

## 2022-08-27 DIAGNOSIS — R410 Disorientation, unspecified: Secondary | ICD-10-CM | POA: Diagnosis not present

## 2022-08-27 DIAGNOSIS — W06XXXA Fall from bed, initial encounter: Secondary | ICD-10-CM | POA: Diagnosis present

## 2022-08-27 DIAGNOSIS — Z881 Allergy status to other antibiotic agents status: Secondary | ICD-10-CM

## 2022-08-27 DIAGNOSIS — F0284 Dementia in other diseases classified elsewhere, unspecified severity, with anxiety: Secondary | ICD-10-CM | POA: Diagnosis present

## 2022-08-27 DIAGNOSIS — G20A1 Parkinson's disease without dyskinesia, without mention of fluctuations: Secondary | ICD-10-CM | POA: Diagnosis not present

## 2022-08-27 DIAGNOSIS — I129 Hypertensive chronic kidney disease with stage 1 through stage 4 chronic kidney disease, or unspecified chronic kidney disease: Secondary | ICD-10-CM | POA: Diagnosis not present

## 2022-08-27 DIAGNOSIS — R000 Tachycardia, unspecified: Secondary | ICD-10-CM | POA: Diagnosis not present

## 2022-08-27 DIAGNOSIS — U071 COVID-19: Secondary | ICD-10-CM | POA: Diagnosis not present

## 2022-08-27 DIAGNOSIS — F32A Depression, unspecified: Secondary | ICD-10-CM | POA: Diagnosis not present

## 2022-08-27 DIAGNOSIS — R0689 Other abnormalities of breathing: Secondary | ICD-10-CM | POA: Diagnosis not present

## 2022-08-27 DIAGNOSIS — I499 Cardiac arrhythmia, unspecified: Secondary | ICD-10-CM | POA: Diagnosis not present

## 2022-08-27 DIAGNOSIS — E1069 Type 1 diabetes mellitus with other specified complication: Secondary | ICD-10-CM | POA: Diagnosis not present

## 2022-08-27 DIAGNOSIS — E1022 Type 1 diabetes mellitus with diabetic chronic kidney disease: Secondary | ICD-10-CM | POA: Diagnosis present

## 2022-08-27 DIAGNOSIS — E871 Hypo-osmolality and hyponatremia: Secondary | ICD-10-CM | POA: Diagnosis present

## 2022-08-27 DIAGNOSIS — Z87442 Personal history of urinary calculi: Secondary | ICD-10-CM | POA: Diagnosis not present

## 2022-08-27 DIAGNOSIS — R918 Other nonspecific abnormal finding of lung field: Secondary | ICD-10-CM | POA: Diagnosis not present

## 2022-08-27 DIAGNOSIS — G4733 Obstructive sleep apnea (adult) (pediatric): Secondary | ICD-10-CM | POA: Diagnosis present

## 2022-08-27 DIAGNOSIS — Z87891 Personal history of nicotine dependence: Secondary | ICD-10-CM | POA: Diagnosis not present

## 2022-08-27 DIAGNOSIS — Z91041 Radiographic dye allergy status: Secondary | ICD-10-CM

## 2022-08-27 DIAGNOSIS — R1310 Dysphagia, unspecified: Secondary | ICD-10-CM | POA: Diagnosis not present

## 2022-08-27 DIAGNOSIS — E669 Obesity, unspecified: Secondary | ICD-10-CM | POA: Diagnosis present

## 2022-08-27 DIAGNOSIS — Z818 Family history of other mental and behavioral disorders: Secondary | ICD-10-CM

## 2022-08-27 DIAGNOSIS — Z888 Allergy status to other drugs, medicaments and biological substances status: Secondary | ICD-10-CM

## 2022-08-27 DIAGNOSIS — Z6834 Body mass index (BMI) 34.0-34.9, adult: Secondary | ICD-10-CM

## 2022-08-27 DIAGNOSIS — E869 Volume depletion, unspecified: Secondary | ICD-10-CM | POA: Diagnosis present

## 2022-08-27 DIAGNOSIS — Z880 Allergy status to penicillin: Secondary | ICD-10-CM

## 2022-08-27 LAB — COMPREHENSIVE METABOLIC PANEL
ALT: 16 U/L (ref 0–44)
AST: 21 U/L (ref 15–41)
Albumin: 4 g/dL (ref 3.5–5.0)
Alkaline Phosphatase: 55 U/L (ref 38–126)
Anion gap: 10 (ref 5–15)
BUN: 16 mg/dL (ref 8–23)
CO2: 23 mmol/L (ref 22–32)
Calcium: 9.5 mg/dL (ref 8.9–10.3)
Chloride: 99 mmol/L (ref 98–111)
Creatinine, Ser: 1.28 mg/dL — ABNORMAL HIGH (ref 0.61–1.24)
GFR, Estimated: 56 mL/min — ABNORMAL LOW (ref >60)
Glucose, Bld: 123 mg/dL — ABNORMAL HIGH (ref 70–99)
Potassium: 3.8 mmol/L (ref 3.5–5.1)
Sodium: 132 mmol/L — ABNORMAL LOW (ref 135–145)
Total Bilirubin: 0.7 mg/dL (ref 0.3–1.2)
Total Protein: 7.5 g/dL (ref 6.5–8.1)

## 2022-08-27 LAB — CBC WITH DIFFERENTIAL/PLATELET
Abs Immature Granulocytes: 0.05 10*3/uL (ref 0.00–0.07)
Basophils Absolute: 0.1 10*3/uL (ref 0.0–0.1)
Basophils Relative: 1 %
Eosinophils Absolute: 0 10*3/uL (ref 0.0–0.5)
Eosinophils Relative: 0 %
HCT: 40.4 % (ref 39.0–52.0)
Hemoglobin: 13.2 g/dL (ref 13.0–17.0)
Immature Granulocytes: 0 %
Lymphocytes Relative: 9 %
Lymphs Abs: 1.1 10*3/uL (ref 0.7–4.0)
MCH: 29.5 pg (ref 26.0–34.0)
MCHC: 32.7 g/dL (ref 30.0–36.0)
MCV: 90.4 fL (ref 80.0–100.0)
Monocytes Absolute: 0.8 10*3/uL (ref 0.1–1.0)
Monocytes Relative: 7 %
Neutro Abs: 9.3 10*3/uL — ABNORMAL HIGH (ref 1.7–7.7)
Neutrophils Relative %: 83 %
Platelets: 281 10*3/uL (ref 150–400)
RBC: 4.47 MIL/uL (ref 4.22–5.81)
RDW: 13.8 % (ref 11.5–15.5)
WBC: 11.4 10*3/uL — ABNORMAL HIGH (ref 4.0–10.5)
nRBC: 0 % (ref 0.0–0.2)

## 2022-08-27 LAB — LACTIC ACID, PLASMA: Lactic Acid, Venous: 0.9 mmol/L (ref 0.5–1.9)

## 2022-08-27 LAB — APTT: aPTT: 28 seconds (ref 24–36)

## 2022-08-27 LAB — PROTIME-INR
INR: 1.1 (ref 0.8–1.2)
Prothrombin Time: 14.3 seconds (ref 11.4–15.2)

## 2022-08-27 LAB — CULTURE, BLOOD (ROUTINE X 2): Special Requests: ADEQUATE

## 2022-08-27 LAB — GLUCOSE, CAPILLARY
Glucose-Capillary: 121 mg/dL — ABNORMAL HIGH (ref 70–99)
Glucose-Capillary: 121 mg/dL — ABNORMAL HIGH (ref 70–99)
Glucose-Capillary: 124 mg/dL — ABNORMAL HIGH (ref 70–99)
Glucose-Capillary: 108 mg/dL — ABNORMAL HIGH (ref 70–99)

## 2022-08-27 LAB — SARS CORONAVIRUS 2 BY RT PCR: SARS Coronavirus 2 by RT PCR: POSITIVE — AB

## 2022-08-27 MED ORDER — LORATADINE 10 MG PO TABS: 10.0000 mg | ORAL_TABLET | Freq: Every day | ORAL | Status: DC | PRN

## 2022-08-27 MED ORDER — COCONUT OIL 1000 MG PO CAPS: 1000.0000 mg | ORAL_CAPSULE | Freq: Every evening | ORAL | Status: DC

## 2022-08-27 MED ORDER — CARBIDOPA-LEVODOPA 25-100 MG PO TABS
1.0000 | ORAL_TABLET | Freq: Three times a day (TID) | ORAL | Status: DC
  Administered 2022-08-28 – 2022-08-31 (×10): 1 via ORAL
  Filled 2022-08-27 (×11): qty 1

## 2022-08-27 MED ORDER — QUETIAPINE FUMARATE 50 MG PO TABS: 25.0000 mg | ORAL_TABLET | Freq: Every day | ORAL | Status: DC

## 2022-08-27 MED ORDER — ESCITALOPRAM OXALATE 10 MG PO TABS
5.0000 mg | ORAL_TABLET | Freq: Every day | ORAL | Status: DC
  Administered 2022-08-28 – 2022-08-31 (×4): 5 mg via ORAL
  Filled 2022-08-27 (×5): qty 1

## 2022-08-27 MED ORDER — ROSUVASTATIN CALCIUM 5 MG PO TABS
5.0000 mg | ORAL_TABLET | Freq: Every evening | ORAL | Status: DC
  Administered 2022-08-28 – 2022-08-30 (×3): 5 mg via ORAL
  Filled 2022-08-27 (×4): qty 1

## 2022-08-27 MED ORDER — ENOXAPARIN SODIUM 40 MG/0.4ML IJ SOSY
40.0000 mg | PREFILLED_SYRINGE | INTRAMUSCULAR | Status: DC
  Administered 2022-08-27 – 2022-08-31 (×5): 40 mg via SUBCUTANEOUS
  Filled 2022-08-27 (×5): qty 0.4

## 2022-08-27 MED ORDER — ACETAMINOPHEN 325 MG RE SUPP
325.0000 mg | RECTAL | Status: DC | PRN
  Administered 2022-08-27: 325 mg via RECTAL
  Filled 2022-08-27 (×2): qty 1

## 2022-08-27 MED ORDER — LEVOCETIRIZINE DIHYDROCHLORIDE 5 MG PO TABS: 5.0000 mg | ORAL_TABLET | Freq: Every day | ORAL | Status: DC | PRN

## 2022-08-27 MED ORDER — SODIUM CHLORIDE 0.9 % IV SOLN: INTRAVENOUS | Status: DC

## 2022-08-27 MED ORDER — FOLIC ACID 1 MG PO TABS: 1.0000 mg | ORAL_TABLET | Freq: Every day | ORAL | Status: DC

## 2022-08-27 MED ORDER — ACETAMINOPHEN 500 MG PO TABS: 500.0000 mg | ORAL_TABLET | Freq: Four times a day (QID) | ORAL | Status: DC | PRN

## 2022-08-27 MED ORDER — LOSARTAN POTASSIUM 50 MG PO TABS
100.0000 mg | ORAL_TABLET | Freq: Every day | ORAL | Status: DC
  Administered 2022-08-28 – 2022-08-31 (×4): 100 mg via ORAL
  Filled 2022-08-27 (×5): qty 2

## 2022-08-27 MED ORDER — TURMERIC 500 MG PO TABS: 500.0000 mg | ORAL_TABLET | Freq: Every evening | ORAL | Status: DC

## 2022-08-27 MED ORDER — IPRATROPIUM-ALBUTEROL 20-100 MCG/ACT IN AERS: 1.0000 | INHALATION_SPRAY | Freq: Four times a day (QID) | RESPIRATORY_TRACT | Status: DC | PRN

## 2022-08-27 MED ORDER — SODIUM CHLORIDE 0.9 % IV SOLN
100.0000 mg | Freq: Every day | INTRAVENOUS | Status: AC
  Administered 2022-08-28 – 2022-08-29 (×2): 100 mg via INTRAVENOUS
  Filled 2022-08-27 (×2): qty 20

## 2022-08-27 MED ORDER — SODIUM CHLORIDE 0.9 % IV SOLN
200.0000 mg | Freq: Once | INTRAVENOUS | Status: AC
  Administered 2022-08-27: 200 mg via INTRAVENOUS
  Filled 2022-08-27: qty 40

## 2022-08-27 MED ORDER — ACETAMINOPHEN 650 MG RE SUPP
650.0000 mg | Freq: Once | RECTAL | Status: AC
  Administered 2022-08-27: 650 mg via RECTAL
  Filled 2022-08-27: qty 1

## 2022-08-27 MED ORDER — VITAMIN B-12 1000 MCG PO TABS
1000.0000 ug | ORAL_TABLET | Freq: Every day | ORAL | Status: DC
  Administered 2022-08-28 – 2022-08-31 (×4): 1000 ug via ORAL
  Filled 2022-08-27 (×5): qty 1

## 2022-08-27 MED ORDER — ADULT MULTIVITAMIN W/MINERALS CH: 1.0000 | ORAL_TABLET | Freq: Every day | ORAL | Status: DC

## 2022-08-27 MED ORDER — LACTATED RINGERS IV BOLUS (SEPSIS)
500.0000 mL | Freq: Once | INTRAVENOUS | Status: AC
  Administered 2022-08-27: 500 mL via INTRAVENOUS

## 2022-08-27 MED ORDER — HYDRALAZINE HCL 20 MG/ML IJ SOLN: 5.0000 mg | Freq: Three times a day (TID) | INTRAMUSCULAR | Status: DC | PRN

## 2022-08-27 MED ORDER — GUAIFENESIN 100 MG/5ML PO LIQD: 5.0000 mL | ORAL | Status: DC | PRN

## 2022-08-27 MED ORDER — PROCHLORPERAZINE EDISYLATE 10 MG/2ML IJ SOLN: 5.0000 mg | Freq: Four times a day (QID) | INTRAMUSCULAR | Status: DC | PRN

## 2022-08-27 MED ORDER — INSULIN ASPART 100 UNIT/ML IJ SOLN
0.0000 [IU] | INTRAMUSCULAR | Status: DC
  Administered 2022-08-27 (×2): 1 [IU] via SUBCUTANEOUS
  Administered 2022-08-28: 2 [IU] via SUBCUTANEOUS
  Administered 2022-08-28: 1 [IU] via SUBCUTANEOUS
  Administered 2022-08-28: 2 [IU] via SUBCUTANEOUS
  Administered 2022-08-28: 1 [IU] via SUBCUTANEOUS
  Administered 2022-08-29 (×2): 2 [IU] via SUBCUTANEOUS
  Administered 2022-08-29 (×3): 3 [IU] via SUBCUTANEOUS
  Administered 2022-08-29: 2 [IU] via SUBCUTANEOUS
  Administered 2022-08-30: 7 [IU] via SUBCUTANEOUS
  Administered 2022-08-30: 3 [IU] via SUBCUTANEOUS
  Administered 2022-08-30: 2 [IU] via SUBCUTANEOUS
  Administered 2022-08-30: 3 [IU] via SUBCUTANEOUS
  Administered 2022-08-30: 9 [IU] via SUBCUTANEOUS
  Administered 2022-08-31: 2 [IU] via SUBCUTANEOUS
  Administered 2022-08-31: 5 [IU] via SUBCUTANEOUS
  Administered 2022-08-31 (×2): 2 [IU] via SUBCUTANEOUS

## 2022-08-27 MED ORDER — DONEPEZIL HCL 10 MG PO TABS
10.0000 mg | ORAL_TABLET | Freq: Every day | ORAL | Status: DC
  Administered 2022-08-28 – 2022-08-30 (×3): 10 mg via ORAL
  Filled 2022-08-27 (×4): qty 1

## 2022-08-27 MED ORDER — NIRMATRELVIR/RITONAVIR (PAXLOVID) TABLET (RENAL DOSING)
2.0000 | ORAL_TABLET | Freq: Two times a day (BID) | ORAL | Status: DC
  Filled 2022-08-27: qty 20

## 2022-08-27 NOTE — Progress Notes (Signed)
Pt had a yellow MEWS due to fever and elevated blood pressure. Notified MD. Pt received tylenol suppository. Pt has not received oral meds due to strict NPO orders. Report was given to night RN regarding this pt.

## 2022-08-27 NOTE — Evaluation (Addendum)
Physical Therapy Evaluation Patient Details Name: Christian Morgan MRN: [id number] DOB: 01/11/1941 Today's Date: 08/27/2022  History of Present Illness  82 y.o. male admitted 6/27 from home due to altered mental status x 1 day and after rolling out of bed at home overnight, found to be COVID +.Past medical history significant for dementia, Parkinson's disease, unspecified dysphagia, history of gross hematuria and nephrolithiasis, chronic anxiety/depression, hyperlipidemia, GERD.  Clinical Impression  Pt admitted with above diagnosis. PTA patient mod I at home, ambulatory with SPC/hiking stick; able to bath/dress himself. Son assists pt and wife with other ADLs such as cooking and cleaning. Son reports one fall pt had entering home recently. Some baseline confusion a bit worse, much more delayed. Oriented to self and son only today. Required up to max assist with bed mobility. Inability to stand due to weakness, confusion, and impaired seated balance. SpO2 99-100% on room air throughout session, coughing intermittently. Patient will benefit from continued inpatient follow up therapy, <3 hours/day. Will update recs as he progresses however in current state requires skilled care for safe mobility. Recommended hoyer lift for OOB with nursing for now. May try Stedy at next PT assessment. Pt currently with functional limitations due to the deficits listed below (see PT Problem List). Pt will benefit from acute skilled PT to increase their independence and safety with mobility to allow discharge.          Recommendations for follow up therapy are one component of a multi-disciplinary discharge planning process, led by the attending physician.  Recommendations may be updated based on patient status, additional functional criteria and insurance authorization.  Follow Up Recommendations Can patient physically be transported by private vehicle: No     Assistance Recommended at Discharge Frequent or  constant Supervision/Assistance  Patient can return home with the following  Two people to help with walking and/or transfers;Two people to help with bathing/dressing/bathroom;Assistance with feeding;Assistance with cooking/housework;Direct supervision/assist for medications management;Direct supervision/assist for financial management;Assist for transportation;Help with stairs or ramp for entrance    Equipment Recommendations None recommended by PT  Recommendations for Other Services       Functional Status Assessment Patient has had a recent decline in their functional status and demonstrates the ability to make significant improvements in function in a reasonable and predictable amount of time.     Precautions / Restrictions Precautions Precautions: Fall;Other (comment) Precaution Comments: COVID + Restrictions Weight Bearing Restrictions: No      Mobility  Bed Mobility Overal bed mobility: Needs Assistance Bed Mobility: Rolling, Sidelying to Sit, Sit to Sidelying Rolling: Mod assist Sidelying to sit: Max assist     Sit to sidelying: Mod assist General bed mobility comments: Mod assist to roll with cues and assist to reach for rail. Max assist to rise to EOB with assist for LEs and trunk support, leaning posteriorly. Mod assist for trunk to return to bed, able to lift LEs back into bed. Cues throughout.    Transfers Overall transfer level: Needs assistance Equipment used: Rolling walker (2 wheels) Transfers: Sit to/from Stand Sit to Stand: Total assist           General transfer comment: Unable to fully stand from EOB, too weak, having difficulty following commands and is leaning backwards.    Ambulation/Gait                  Stairs            Wheelchair Mobility    Modified Rankin (Stroke  Patients Only)       Balance Overall balance assessment: Needs assistance, History of Falls Sitting-balance support: Feet supported, Bilateral upper  extremity supported Sitting balance-Leahy Scale: Poor Sitting balance - Comments: Requires min assist to remain seated upright, leaning backwards Postural control: Posterior lean                                   Pertinent Vitals/Pain Pain Assessment Pain Assessment: No/denies pain    Home Living Family/patient expects to be discharged to:: Private residence Living Arrangements: Spouse/significant other;Children Available Help at Discharge: Family;Available 24 hours/day (son cares for parents) Type of Home: House Home Access: Level entry       Home Layout: One level Home Equipment: Agricultural consultant (2 wheels);Cane - single point;Rollator (4 wheels);Shower seat      Prior Function Prior Level of Function : Needs assist;History of Falls (last six months)             Mobility Comments: Uses SPC and hiking stick; one fall recently ADLs Comments: ind with bath/dress; son helps with cooking and cleaning     Hand Dominance   Dominant Hand: Right    Extremity/Trunk Assessment   Upper Extremity Assessment Upper Extremity Assessment: Defer to OT evaluation    Lower Extremity Assessment Lower Extremity Assessment: Generalized weakness;Difficult to assess due to impaired cognition       Communication   Communication: No difficulties  Cognition Arousal/Alertness: Awake/alert Behavior During Therapy: WFL for tasks assessed/performed Overall Cognitive Status: History of cognitive impairments - at baseline (son reports baseline but more delayed, poor memory)                                          General Comments General comments (skin integrity, edema, etc.): SpO2 99-100% on RA through duration of session. Son present, very supportive.    Exercises     Assessment/Plan    PT Assessment Patient needs continued PT services  PT Problem List Decreased strength;Decreased activity tolerance;Decreased balance;Decreased mobility;Decreased  coordination;Decreased cognition;Decreased knowledge of use of DME;Decreased safety awareness;Decreased knowledge of precautions;Obesity       PT Treatment Interventions DME instruction;Gait training;Functional mobility training;Therapeutic activities;Therapeutic exercise;Balance training;Cognitive remediation;Neuromuscular re-education;Patient/family education;Wheelchair mobility training    PT Goals (Current goals can be found in the Care Plan section)  Acute Rehab PT Goals Patient Stated Goal: none stated PT Goal Formulation: With patient/family Time For Goal Achievement: 09/10/22 Potential to Achieve Goals: Good    Frequency Min 3X/week     Co-evaluation               AM-PAC PT "6 Clicks" Mobility  Outcome Measure Help needed turning from your back to your side while in a flat bed without using bedrails?: A Lot Help needed moving from lying on your back to sitting on the side of a flat bed without using bedrails?: A Lot Help needed moving to and from a bed to a chair (including a wheelchair)?: Total Help needed standing up from a chair using your arms (e.g., wheelchair or bedside chair)?: Total Help needed to walk in hospital room?: Total Help needed climbing 3-5 steps with a railing? : Total 6 Click Score: 8    End of Session Equipment Utilized During Treatment: Gait belt Activity Tolerance: Patient limited by lethargy;Patient limited by fatigue Patient left:  in bed;with call bell/phone within reach;with bed alarm set;with family/visitor present Nurse Communication: Mobility status;Need for lift equipment PT Visit Diagnosis: Muscle weakness (generalized) (M62.81);History of falling (Z91.81);Difficulty in walking, not elsewhere classified (R26.2);Other symptoms and signs involving the nervous system (R29.898)    Time: 2440-1027 PT Time Calculation (min) (ACUTE ONLY): 29 min   Charges:   PT Evaluation $PT Eval Moderate Complexity: 1 Mod PT Treatments $Therapeutic  Activity: 8-22 mins        Kathlyn [place], PT, DPT [facility]  [facility] Services Physical Therapist Office: [phone] Website: [email or web address]   Berton Mount 08/27/2022, 3:46 PM

## 2022-08-27 NOTE — Evaluation (Signed)
Occupational Therapy Evaluation Patient Details Name: Christian Morgan MRN: [id number] DOB: [birth year] Today's Date: 08/27/2022   History of Present Illness 82 y.o. male admitted 6/27 from home due to altered mental status x 1 day and after rolling out of bed at home overnight, found to be COVID +.Past medical history significant for dementia, Parkinson's disease, unspecified dysphagia, history of gross hematuria and nephrolithiasis, chronic anxiety/depression, hyperlipidemia, GERD.   Clinical Impression   Pt s/p above diagnosis. Pt not feeling well today, very lethargic, states there is pain, unable to answer how much pain or location. Pt unable to answer questions about history, was able to state name and DOB, Per PT, son was able to assist with history. . Pt extremely limited with BUE AROM, no functional grip strength or AROM, not able to hold objects when attempting to perform grooming at bed level, attempted to sit EOB, Pt unable to lift legs or assist to EOB. Pt assisted back to center of bed. Pt would benefit greatly from post acute OT <3hours/day and continued acute OT to improve functional strength, independence, and participation to functional level prior to return home.      Recommendations for follow up therapy are one component of a multi-disciplinary discharge planning process, led by the attending physician.  Recommendations may be updated based on patient status, additional functional criteria and insurance authorization.   Assistance Recommended at Discharge Frequent or constant Supervision/Assistance  Patient can return home with the following Two people to help with walking and/or transfers;Two people to help with bathing/dressing/bathroom;Direct supervision/assist for medications management;Assist for transportation;Help with stairs or ramp for entrance;Assistance with cooking/housework    Functional Status Assessment  Patient has had a recent decline in their functional  status and demonstrates the ability to make significant improvements in function in a reasonable and predictable amount of time.  Equipment Recommendations  Other (comment) (defer)    Recommendations for Other Services       Precautions / Restrictions Precautions Precautions: Fall;Other (comment) Precaution Comments: COVID + Restrictions Weight Bearing Restrictions: No      Mobility Bed Mobility Overal bed mobility: Needs Assistance Bed Mobility: Rolling, Sidelying to Sit, Sit to Sidelying Rolling: Mod assist Sidelying to sit: Max assist     Sit to sidelying: Mod assist General bed mobility comments: Pt had difficulty with bed mobility, attempted to sit on EOB, very lethargic, impaired cognition, unable to follow commands, difficult to assess, Per PT mod-max for bed mobility    Transfers Overall transfer level: Needs assistance                 General transfer comment: duid not attempt, Pt very fatigued, not able to fully participate or sit on EOB      Balance Overall balance assessment: Needs assistance, History of Falls                                         ADL either performed or assessed with clinical judgement   ADL Overall ADL's : Needs assistance/impaired     Grooming: Maximal assistance   Upper Body Bathing: Maximal assistance   Lower Body Bathing: Total assistance   Upper Body Dressing : Maximal assistance   Lower Body Dressing: Total assistance   Toilet Transfer: Total assistance   Toileting- Clothing Manipulation and Hygiene: Total assistance         General ADL Comments: Pt requires  max-total care due to impaired congition, decreased overall strength below functional level     Vision         Perception     Praxis      Pertinent Vitals/Pain       Hand Dominance Right   Extremity/Trunk Assessment Upper Extremity Assessment Upper Extremity Assessment: Generalized weakness;Difficult to assess due to  impaired cognition;RUE deficits/detail;LUE deficits/detail RUE Deficits / Details: severe BUE weakness, minimal AROM, less than functional grip, not able to follow instructions, difficulty to assess. LUE Deficits / Details: severe BUE weakness, minimal AROM, less than functional grip, not able to follow instructions, difficulty to assess.   Lower Extremity Assessment Lower Extremity Assessment: Defer to PT evaluation       Communication Communication Communication: No difficulties   Cognition Arousal/Alertness: Awake/alert Behavior During Therapy: WFL for tasks assessed/performed Overall Cognitive Status: History of cognitive impairments - at baseline                                       General Comments  SpO2 99-100% on RA through duration of session. Son present, very supportive.    Exercises     Shoulder Instructions      Home Living Family/patient expects to be discharged to:: Private residence Living Arrangements: Spouse/significant other;Children Available Help at Discharge: Family;Available 24 hours/day (son cares for parents) Type of Home: House Home Access: Level entry     Home Layout: One level     Bathroom Shower/Tub: Chief Strategy Officer: Handicapped height Bathroom Accessibility: Yes   Home Equipment: Agricultural consultant (2 wheels);Cane - single point;Rollator (4 wheels);Shower seat          Prior Functioning/Environment Prior Level of Function : Needs assist;History of Falls (last six months)             Mobility Comments: Uses SPC and hiking stick; one fall recently ADLs Comments: ind with bath/dress; son helps with cooking and cleaning        OT Problem List: Decreased strength;Decreased range of motion;Decreased activity tolerance;Impaired balance (sitting and/or standing);Decreased coordination;Decreased safety awareness;Decreased cognition;Impaired UE functional use;Pain      OT Treatment/Interventions:  Self-care/ADL training;Therapeutic exercise;Neuromuscular education;Energy conservation;DME and/or AE instruction;Therapeutic activities;Patient/family education    OT Goals(Current goals can be found in the care plan section) Acute Rehab OT Goals Patient Stated Goal: unable to participate in goal participation OT Goal Formulation: With patient Time For Goal Achievement: 09/10/22 Potential to Achieve Goals: Good  OT Frequency: Min 2X/week    Co-evaluation              AM-PAC OT "6 Clicks" Daily Activity     Outcome Measure Help from another person eating meals?: A Lot Help from another person taking care of personal grooming?: A Lot Help from another person toileting, which includes using toliet, bedpan, or urinal?: A Lot Help from another person bathing (including washing, rinsing, drying)?: Total Help from another person to put on and taking off regular upper body clothing?: A Lot Help from another person to put on and taking off regular lower body clothing?: Total 6 Click Score: 10   End of Session Nurse Communication: Mobility status  Activity Tolerance: Patient limited by lethargy;Patient limited by fatigue Patient left: in bed;with call bell/phone within reach;with bed alarm set  OT Visit Diagnosis: Unsteadiness on feet (R26.81);Other abnormalities of gait and mobility (R26.89);Muscle weakness (generalized) (M62.81);Other symptoms and  signs involving cognitive function;Pain                Time: 1610-9604 OT Time Calculation (min): 13 min Charges:  OT General Charges $OT Visit: 1 Visit OT Evaluation $OT Eval Moderate Complexity: 1 [address] OTR/L   Alexis Goodell 08/27/2022, 4:29 PM

## 2022-08-27 NOTE — Progress Notes (Signed)
Brief progress note: -Patient was admitted earlier today with confusion, falling and positive COVID 19 infection.  Patient has been afebrile.  Patient is on IV remdesivir.  As per H&P done earlier today by Dr. Raphael Gibney: "Christian Morgan is a 82 y.o. male with medical history significant for dementia, Parkinson's disease, unspecified dysphagia, history of gross hematuria and nephrolithiasis, chronic anxiety/depression, hyperlipidemia, GERD, who presented to [facility] ED from home due to altered mental status x 1 day and after rolling out of bed at home overnight.  He lives at home with his wife who was diagnosed with COVID-19 infection 2 days ago.  They sleep in the same bed.  EMS was activated for getting the patient off the floor after falling out of bed.  For the past 24 hours, the patient has been more confused, sleeping more and having difficulty understanding how to swallow his pills.  Instead of swallowing them he chews them.  Upon EMS arrival the patient was febrile and noted to be confused.  No signs of trauma on exam.  He was brought into the ED for further evaluation.   In the ED, febrile with Tmax 101.5, tachycardic, tachypneic.  COVID-19 screening test positive.  Not hypoxic with O2 saturation of 100% on room air.  Chest x-ray revealed the following findings: Interval increased retrocardiac left lower lobe opacity which could be atelectasis, pneumonia or aspiration. Follow-up PA and lateral study recommended to see if this persists. Lab studies notable for mild hyponatremia and mild leukocytosis.  The patient received 500 cc IV fluid bolus LR x 1 and rectal Tylenol for his fever in the ED.  Peripheral blood cultures x 2 were obtained.  Lactic acid was unremarkable.  CT head was non acute.  TRH, hospitalist service, was asked to admit.   ED Course: Tmax 101.5.  BP 143/75, pulse 91, respiratory 18, saturation 100% on room air.  Lab studies notable for WBC 11.4, neutrophil count 9.3.  Serum  sodium 132, glucose 123, creatinine 1.28 with GFR 56."  08/27/2022: Continue to manage expectantly.  Monitor inflammatory markers.  Supportive management.  Consult speech therapy.  Further management depend on hospital course.

## 2022-08-27 NOTE — ED Notes (Signed)
ED TO INPATIENT HANDOFF REPORT  ED Nurse Name and Phone #: seth [phone]  S Name/Age/Gender Christian Morgan 82 y.o. male Room/Bed: 019C/019C  Code Status   Code Status: Full Code  Home/SNF/Other Home Patient oriented to: self and place Is this baseline? No   Triage Complete: Triage complete  Chief Complaint COVID-19 virus infection [U07.1]  Triage Note Pt is coming in for altered mental status with weakness, confusion, only answering questions with yes and no so far. Pt is unconfirmed positive but the wife is positive for COVID and has been sleeping in same bed as the wife. Pt did spike a fever lastnight, no tylenol given due to inability to take medications, pt also has a cough present. Chills present per medic and family reports. Hx of dementia   Medic Vitals   156/91 108 22 35eto2 106bgl 96%  Ns given in route   20g left wrist     Allergies Allergies  Allergen Reactions   Lipitor [Atorvastatin] Other (See Comments)    Increased liver enzymes and joint pain from numerous statins   Penicillins Hives and Rash    Did it involve swelling of the face/tongue/throat, SOB, or low BP? No Did it involve sudden or severe rash/hives, skin peeling, or any reaction on the inside of your mouth or nose? No Did you need to seek medical attention at a hospital or doctor's office? yes When did it last happen?       30 years If all above answers are "NO", may proceed with cephalosporin use.   Amitriptyline Hcl     Unknown reaction   Cyclobenzaprine     Burning in stomach   Gabapentin     Dizziness    Other Other (See Comments)    [place]: "Terrible migraines."   Zetia [Ezetimibe]     Joint pain   Aspirin Other (See Comments)    Upset stomach   Doxycycline Hives    Reaction took 4-5 days   Iodinated Contrast Media Hives    Developed hives after contrast "50 years ago when I had a subdural hematoma" at age 16.  Tolerates now with Benadryl 50mg  PO one hour before  contrast.   Levofloxacin Hives    Delayed allergic reaction    Level of Care/Admitting Diagnosis ED Disposition     ED Disposition  Admit   Condition  --   Comment  Hospital Area: [facility] [[id number]]  Level of Care: Telemetry Medical [104]  May place patient in observation at [facility] or [facility] if equivalent level of care is available:: Yes  Covid Evaluation: Asymptomatic - no recent exposure (last 10 days) testing not required  Diagnosis: COVID-19 virus infection [[id number]]  Admitting Physician: Darlin Drop [[id number]]  Attending Physician: Darlin Drop [[id number]]          B Medical/Surgery History Past Medical History:  Diagnosis Date   Allergic rhinitis    Chronic headache    Complication of anesthesia    after eye surgery, had trouble catching breath   Daytime somnolence 01/05/2017   Essential hypertension 10/31/2009   Hard of hearing    Hyperglycemia due to diabetes mellitus 09/20/2017   Hyperlipidemia    Hypertension    Major neurocognitive disorder, unclear etiology 08/02/2019   OSA (obstructive sleep apnea) 2011   s/p septoplasty; was supposed to wear cpap but better after septoplasty   Type I diabetes mellitus 10/31/2009   Past Surgical History:  Procedure Laterality Date  BACK SURGERY     COLONOSCOPY     EYE SURGERY Bilateral    cataract surgery with lens implants   NASAL SEPTOPLASTY W/ TURBINOPLASTY Bilateral 01/11/2019   Procedure: NASAL SEPTOPLASTY;  Surgeon: Newman Pies, MD;  Location: [facility] OR;  Service: ENT;  Laterality: Bilateral;   S/p Crani for CHI     subdural hematoma    SHOULDER SURGERY  2009   Right   TURBINATE REDUCTION Bilateral 01/11/2019   Procedure: Turbinate Reduction;  Surgeon: Newman Pies, MD;  Location: [facility] OR;  Service: ENT;  Laterality: Bilateral;     A IV Location/Drains/Wounds Patient Lines/Drains/Airways Status     Active Line/Drains/Airways     Name Placement date Placement time Site Days    Peripheral IV 08/27/22 18 G 1.25" Anterior;Distal;Right;Upper Arm 08/27/22  0358  Arm  less than 1            Intake/Output Last 24 hours No intake or output data in the 24 hours ending 08/27/22 0519  Labs/Imaging Results for orders placed or performed during the hospital encounter of 08/27/22 (from the past 48 hour(s))  SARS Coronavirus 2 by RT PCR (hospital order, performed in [facility] hospital lab) *cepheid single result test* Anterior Nasal Swab     Status: Abnormal   Collection Time: 08/27/22  3:27 AM   Specimen: Anterior Nasal Swab  Result Value Ref Range   SARS Coronavirus 2 by RT PCR POSITIVE (A) NEGATIVE    Comment: Performed at [facility], [address], [place], [place] [address]  Lactic acid, plasma     Status: None   Collection Time: 08/27/22  3:36 AM  Result Value Ref Range   Lactic Acid, Venous 0.9 0.5 - 1.9 mmol/L    Comment: Performed at [facility], [address] [place], [place] [address]  Comprehensive metabolic panel     Status: Abnormal   Collection Time: 08/27/22  3:36 AM  Result Value Ref Range   Sodium 132 (L) 135 - 145 mmol/L   Potassium 3.8 3.5 - 5.1 mmol/L   Chloride 99 98 - 111 mmol/L   CO2 23 22 - 32 mmol/L   Glucose, Bld 123 (H) 70 - 99 mg/dL    Comment: Glucose reference range applies only to samples taken after fasting for at least 8 hours.   BUN 16 8 - 23 mg/dL   Creatinine, Ser 0.98 (H) 0.61 - 1.24 mg/dL   Calcium 9.5 8.9 - 11.9 mg/dL   Total Protein 7.5 6.5 - 8.1 g/dL   Albumin 4.0 3.5 - 5.0 g/dL   AST 21 15 - 41 U/L   ALT 16 0 - 44 U/L   Alkaline Phosphatase 55 38 - 126 U/L   Total Bilirubin 0.7 0.3 - 1.2 mg/dL   GFR, Estimated 56 (L) >60 mL/min    Comment: (NOTE) Calculated using the CKD-EPI Creatinine Equation (2021)    Anion gap 10 5 - 15    Comment: Performed at [facility], [address] [place], [place] [address]  CBC with Differential     Status: Abnormal   Collection Time: 08/27/22  3:36 AM   Result Value Ref Range   WBC 11.4 (H) 4.0 - 10.5 K/uL   RBC 4.47 4.22 - 5.81 MIL/uL   Hemoglobin 13.2 13.0 - 17.0 g/dL   HCT 95.6 21.3 - 08.6 %   MCV 90.4 80.0 - 100.0 fL   MCH 29.5 26.0 - 34.0 pg   MCHC  32.7 30.0 - 36.0 g/dL   RDW 86.5 78.4 - 69.6 %   Platelets 281 150 - 400 K/uL   nRBC 0.0 0.0 - 0.2 %   Neutrophils Relative % 83 %   Neutro Abs 9.3 (H) 1.7 - 7.7 K/uL   Lymphocytes Relative 9 %   Lymphs Abs 1.1 0.7 - 4.0 K/uL   Monocytes Relative 7 %   Monocytes Absolute 0.8 0.1 - 1.0 K/uL   Eosinophils Relative 0 %   Eosinophils Absolute 0.0 0.0 - 0.5 K/uL   Basophils Relative 1 %   Basophils Absolute 0.1 0.0 - 0.1 K/uL   Immature Granulocytes 0 %   Abs Immature Granulocytes 0.05 0.00 - 0.07 K/uL    Comment: Performed at [facility], [address], [place], [place] [address]  Protime-INR     Status: None   Collection Time: 08/27/22  3:36 AM  Result Value Ref Range   Prothrombin Time 14.3 11.4 - 15.2 seconds   INR 1.1 0.8 - 1.2    Comment: (NOTE) INR goal varies based on device and disease states. Performed at [facility], [address], [place], [place] [address]   APTT     Status: None   Collection Time: 08/27/22  3:36 AM  Result Value Ref Range   aPTT 28 24 - 36 seconds    Comment: Performed at [facility], [address], [place], [place] [address]   DG Chest Port 1 View  Result Date: 08/27/2022 CLINICAL DATA:  Questionable sepsis with weakness and confusion. EXAM: PORTABLE CHEST 1 VIEW COMPARISON:  PA Lat 01/08/2010 FINDINGS: 4:02 a.m. The cardiac size is upper limits of normal. No vascular congestion is seen. There is mild aortic ectasia and tortuosity, otherwise unremarkable mediastinum. No substantial pleural effusion is evident. There is interval increased retrocardiac opacity in the left lower lobe which could be atelectasis, pneumonia or aspiration. Follow-up PA and lateral study is recommended to see if this persists. The remaining lungs  are clear. Moderate thoracic spondylosis.  Multiple overlying monitor wires. IMPRESSION: Interval increased retrocardiac left lower lobe opacity which could be atelectasis, pneumonia or aspiration. Follow-up PA and lateral study recommended to see if this persists. Electronically Signed   By: Almira Bar M.D.   On: 08/27/2022 04:51   CT Head Wo Contrast  Result Date: 08/27/2022 CLINICAL DATA:  82 year old male with altered mental status. EXAM: CT HEAD WITHOUT CONTRAST TECHNIQUE: Contiguous axial images were obtained from the base of the skull through the vertex without intravenous contrast. RADIATION DOSE REDUCTION: This exam was performed according to the departmental dose-optimization program which includes automated exposure control, adjustment of the mA and/or kV according to patient size and/or use of iterative reconstruction technique. COMPARISON:  Brain MRI 08/03/2019.  Head CT 09/19/2020. FINDINGS: Brain: Stable cerebral volume. No midline shift, ventriculomegaly, mass effect, evidence of mass lesion, intracranial hemorrhage or evidence of cortically based acute infarction. Patchy mild to moderate for age bilateral cerebral white matter hypodensity appears progressed from the previous exams. But otherwise stable gray-white differentiation throughout the brain. Vascular: Calcified atherosclerosis at the skull base. No suspicious intracranial vascular hyperdensity. Skull: Chronic left side craniotomy. No acute osseous abnormality identified. Sinuses/Orbits: Visualized paranasal sinuses and mastoids are stable and well aerated; Mild chronic right maxillary mucosal thickening. Other: No acute orbit or scalp soft tissue finding. Chronic postoperative changes. IMPRESSION: 1. No acute intracranial abnormality. 2. Chronic left side craniotomy. Progression since 2022 of cerebral white matter changes,  most commonly due to chronic small vessel disease. Electronically Signed   By: Odessa Fleming M.D.   On: 08/27/2022  04:02    Pending Labs Unresulted Labs (From admission, onward)     Start     Ordered   09/03/22 0500  Creatinine, serum  (enoxaparin (LOVENOX)    CrCl >/= 30 ml/min)  Weekly,   R     Comments: while on enoxaparin therapy    08/27/22 0508   08/28/22 0500  CBC  Tomorrow morning,   R        08/27/22 0509   08/28/22 0500  Basic metabolic panel  Tomorrow morning,   R        08/27/22 0509   08/28/22 0500  Magnesium  Tomorrow morning,   R        08/27/22 0509   08/28/22 0500  Phosphorus  Tomorrow morning,   R        08/27/22 0509   08/27/22 0510  Urine Culture (for pregnant, neutropenic or urologic patients or patients with an indwelling urinary catheter)  (Urine Labs)  Once,   R       Question:  Indication  Answer:  Altered mental status (if no other cause identified)   08/27/22 0509   08/27/22 0327  Urinalysis, w/ Reflex to Culture (Infection Suspected) -Urine, Clean Catch  (Undifferentiated presentation (screening labs and basic nursing orders))  Once,   URGENT       Question:  Specimen Source  Answer:  Urine, Clean Catch   08/27/22 0327   08/27/22 0326  Lactic acid, plasma  (Undifferentiated presentation (screening labs and basic nursing orders))  Now then every 2 hours,   R      08/27/22 0327   08/27/22 0326  Blood Culture (routine x 2)  (Undifferentiated presentation (screening labs and basic nursing orders))  BLOOD CULTURE X 2,   STAT      08/27/22 0327            Vitals/Pain Today's Vitals   08/27/22 0415 08/27/22 0422 08/27/22 0445 08/27/22 0500  BP: (!) 160/77  (!) 146/74 (!) 143/75  Pulse: 97 93 91 91  Resp: 18 17 (!) 21 18  Temp:      TempSrc:      SpO2: 100% 100% 100% 100%  PainSc:        Isolation Precautions No active isolations  Medications Medications  enoxaparin (LOVENOX) injection 40 mg (has no administration in time range)  lactated ringers bolus 500 mL (0 mLs Intravenous Stopped 08/27/22 0504)  acetaminophen (TYLENOL) suppository 650 mg (650 mg  Rectal Given 08/27/22 0401)    Mobility non-ambulatory     Focused Assessments    R Recommendations: See Admitting Provider Note  Report given to:   Additional Notes: Pt has hx of dementia but has has worsening confusion. Family at the bedside. Normally continent but has episodes of incontinence.

## 2022-08-27 NOTE — H&P (Addendum)
History and Physical  Christian Morgan QIO:[id number] DOB: 07-Jun-1940 DOA: 08/27/2022  Referring physician: Dr. Madilyn Hook, EDP  PCP: [name], MD  Outpatient Specialists: Neurology Patient coming from: Home  Chief Complaint: Altered mental status.  HPI: Christian Morgan is a 82 y.o. male with medical history significant for dementia, Parkinson's disease, unspecified dysphagia, history of gross hematuria and nephrolithiasis, chronic anxiety/depression, hyperlipidemia, GERD, who presented to [facility] ED from home due to altered mental status x 1 day and after rolling out of bed at home overnight.  He lives at home with his wife who was diagnosed with COVID-19 infection 2 days ago.  They sleep in the same bed.  EMS was activated for getting the patient off the floor after falling out of bed.  For the past 24 hours, the patient has been more confused, sleeping more and having difficulty understanding how to swallow his pills.  Instead of swallowing them he chews them.  Upon EMS arrival the patient was febrile and noted to be confused.  No signs of trauma on exam.  He was brought into the ED for further evaluation.  In the ED, febrile with Tmax 101.5, tachycardic, tachypneic.  COVID-19 screening test positive.  Not hypoxic with O2 saturation of 100% on room air.  Chest x-ray revealed the following findings: Interval increased retrocardiac left lower lobe opacity which could be atelectasis, pneumonia or aspiration. Follow-up PA and lateral study recommended to see if this persists. Lab studies notable for mild hyponatremia and mild leukocytosis.  The patient received 500 cc IV fluid bolus LR x 1 and rectal Tylenol for his fever in the ED.  Peripheral blood cultures x 2 were obtained.  Lactic acid was unremarkable.  CT head was non acute.  TRH, hospitalist service, was asked to admit.  ED Course: Tmax 101.5.  BP 143/75, pulse 91, respiratory 18, saturation 100% on room air.  Lab studies notable for WBC  11.4, neutrophil count 9.3.  Serum sodium 132, glucose 123, creatinine 1.28 with GFR 56.  Review of Systems: Review of systems as noted in the HPI. All other systems reviewed and are negative.   Past Medical History:  Diagnosis Date   Allergic rhinitis    Chronic headache    Complication of anesthesia    after eye surgery, had trouble catching breath   Daytime somnolence 01/05/2017   Essential hypertension 10/31/2009   Hard of hearing    Hyperglycemia due to diabetes mellitus 09/20/2017   Hyperlipidemia    Hypertension    Major neurocognitive disorder, unclear etiology 08/02/2019   OSA (obstructive sleep apnea) 2011   s/p septoplasty; was supposed to wear cpap but better after septoplasty   Type I diabetes mellitus 10/31/2009   Past Surgical History:  Procedure Laterality Date   BACK SURGERY     COLONOSCOPY     EYE SURGERY Bilateral    cataract surgery with lens implants   NASAL SEPTOPLASTY W/ TURBINOPLASTY Bilateral 01/11/2019   Procedure: NASAL SEPTOPLASTY;  Surgeon: Newman Pies, MD;  Location: [facility] OR;  Service: ENT;  Laterality: Bilateral;   S/p Crani for CHI     subdural hematoma    SHOULDER SURGERY  2009   Right   TURBINATE REDUCTION Bilateral 01/11/2019   Procedure: Turbinate Reduction;  Surgeon: Newman Pies, MD;  Location: [facility] OR;  Service: ENT;  Laterality: Bilateral;    Social History:  reports that he quit smoking about 13 years ago. His smoking use included cigarettes and cigars. He smoked  an average of .1 packs per day. He has never used smokeless tobacco. He reports that he does not currently use alcohol. He reports that he does not use drugs.   Allergies  Allergen Reactions   Lipitor [Atorvastatin] Other (See Comments)    Increased liver enzymes and joint pain from numerous statins   Penicillins Hives and Rash    Did it involve swelling of the face/tongue/throat, SOB, or low BP? No Did it involve sudden or severe rash/hives, skin peeling, or any reaction on the  inside of your mouth or nose? No Did you need to seek medical attention at a hospital or doctor's office? yes When did it last happen?       30 years If all above answers are "NO", may proceed with cephalosporin use.   Amitriptyline Hcl     Unknown reaction   Cyclobenzaprine     Burning in stomach   Gabapentin     Dizziness    Other Other (See Comments)    [place]: "Terrible migraines."   Zetia [Ezetimibe]     Joint pain   Aspirin Other (See Comments)    Upset stomach   Doxycycline Hives    Reaction took 4-5 days   Iodinated Contrast Media Hives    Developed hives after contrast "50 years ago when I had a subdural hematoma" at age 72.  Tolerates now with Benadryl 50mg  PO one hour before contrast.   Levofloxacin Hives    Delayed allergic reaction    Family History  Problem Relation Age of Onset   Stroke Sister    Lung cancer Brother    Diabetes Mother    Depression Mother    Alcohol abuse Father       Prior to Admission medications   Medication Sig Start Date End Date Taking? Authorizing Provider  acetaminophen (TYLENOL) 500 MG tablet Take 500 mg by mouth every 6 (six) hours as needed for moderate pain or headache.    [provider]  carbidopa-levodopa (SINEMET IR) 25-100 MG tablet TAKE 1 TABLET BY MOUTH THREE TIMES A DAY 11/06/21   Tat, Octaviano Batty, DO  carbidopa-levodopa (SINEMET IR) 25-100 MG tablet TAKE 1 TABLET BY MOUTH THREE TIMES A DAY 07/31/22   Tat, Octaviano Batty, DO  Coconut Oil 1000 MG CAPS Take by mouth.    [provider]  donepezil (ARICEPT) 10 MG tablet TAKE 1 TABLET BY MOUTH EVERYDAY AT BEDTIME 07/28/22   Tat, Rebecca S, DO  escitalopram (LEXAPRO) 10 MG tablet Take 5 mg by mouth daily.  12/08/19   [provider]  levocetirizine (XYZAL) 5 MG tablet Take 5 mg by mouth every evening.    [provider]  loratadine (CLARITIN) 10 MG tablet Take 10 mg by mouth daily.    [provider]  losartan (COZAAR) 100 MG tablet Take 100 mg  by mouth daily.    [provider]  metFORMIN (GLUCOPHAGE-XR) 500 MG 24 hr tablet Take 1,000 mg by mouth 2 (two) times daily.    [provider]  QUEtiapine (SEROQUEL) 25 MG tablet Take 1 tablet (25 mg total) by mouth at bedtime. 03/03/22   Tat, Octaviano Batty, DO  rosuvastatin (CRESTOR) 5 MG tablet Take 5 mg by mouth daily.    [provider]  Turmeric 500 MG TABS 2 tablets    [provider]  vitamin B-12 (CYANOCOBALAMIN) 1000 MCG tablet Take 1,000 mcg by mouth daily.    [provider]    Physical Exam: BP Marland Kitchen)  143/75   Pulse 91   Temp (!) 101.5 F (38.6 C) (Oral)   Resp 18   SpO2 100%   General: 82 y.o. year-old male well developed well nourished in no acute distress.  Somnolent and confused upon arousal. Cardiovascular: Regular rate and rhythm with no rubs or gallops.  No thyromegaly or JVD noted.  No lower extremity edema. 2/4 pulses in all 4 extremities. Respiratory: Clear to auscultation with no wheezes or rales. Poor inspiratory effort. Abdomen: Soft nontender nondistended with normal bowel sounds x4 quadrants. Muskuloskeletal: No cyanosis, clubbing or edema noted bilaterally Neuro: CN II-XII intact, strength, sensation, reflexes Skin: No ulcerative lesions noted or rashes Psychiatry: Unable to assess mood due to somnolence and confusion.         Labs on Admission:  Basic Metabolic Panel: Recent Labs  Lab 08/27/22 0336  NA 132*  K 3.8  CL 99  CO2 23  GLUCOSE 123*  BUN 16  CREATININE 1.28*  CALCIUM 9.5   Liver Function Tests: Recent Labs  Lab 08/27/22 0336  AST 21  ALT 16  ALKPHOS 55  BILITOT 0.7  PROT 7.5  ALBUMIN 4.0   No results for input(s): "LIPASE", "AMYLASE" in the last 168 hours. No results for input(s): "AMMONIA" in the last 168 hours. CBC: Recent Labs  Lab 08/27/22 0336  WBC 11.4*  NEUTROABS 9.3*  HGB 13.2  HCT 40.4  MCV 90.4  PLT 281   Cardiac Enzymes: No results for input(s): "CKTOTAL", "CKMB",  "CKMBINDEX", "TROPONINI" in the last 168 hours.  BNP (last 3 results) No results for input(s): "BNP" in the last 8760 hours.  ProBNP (last 3 results) No results for input(s): "PROBNP" in the last 8760 hours.  CBG: No results for input(s): "GLUCAP" in the last 168 hours.  Radiological Exams on Admission: DG Chest Port 1 View  Result Date: 08/27/2022 CLINICAL DATA:  Questionable sepsis with weakness and confusion. EXAM: PORTABLE CHEST 1 VIEW COMPARISON:  PA Lat 01/08/2010 FINDINGS: 4:02 a.m. The cardiac size is upper limits of normal. No vascular congestion is seen. There is mild aortic ectasia and tortuosity, otherwise unremarkable mediastinum. No substantial pleural effusion is evident. There is interval increased retrocardiac opacity in the left lower lobe which could be atelectasis, pneumonia or aspiration. Follow-up PA and lateral study is recommended to see if this persists. The remaining lungs are clear. Moderate thoracic spondylosis.  Multiple overlying monitor wires. IMPRESSION: Interval increased retrocardiac left lower lobe opacity which could be atelectasis, pneumonia or aspiration. Follow-up PA and lateral study recommended to see if this persists. Electronically Signed   By: Almira Bar M.D.   On: 08/27/2022 04:51   CT Head Wo Contrast  Result Date: 08/27/2022 CLINICAL DATA:  82 year old male with altered mental status. EXAM: CT HEAD WITHOUT CONTRAST TECHNIQUE: Contiguous axial images were obtained from the base of the skull through the vertex without intravenous contrast. RADIATION DOSE REDUCTION: This exam was performed according to the departmental dose-optimization program which includes automated exposure control, adjustment of the mA and/or kV according to patient size and/or use of iterative reconstruction technique. COMPARISON:  Brain MRI 08/03/2019.  Head CT 09/19/2020. FINDINGS: Brain: Stable cerebral volume. No midline shift, ventriculomegaly, mass effect, evidence of mass  lesion, intracranial hemorrhage or evidence of cortically based acute infarction. Patchy mild to moderate for age bilateral cerebral white matter hypodensity appears progressed from the previous exams. But otherwise stable gray-white differentiation throughout the brain. Vascular: Calcified atherosclerosis at the skull base. No suspicious intracranial vascular  hyperdensity. Skull: Chronic left side craniotomy. No acute osseous abnormality identified. Sinuses/Orbits: Visualized paranasal sinuses and mastoids are stable and well aerated; Mild chronic right maxillary mucosal thickening. Other: No acute orbit or scalp soft tissue finding. Chronic postoperative changes. IMPRESSION: 1. No acute intracranial abnormality. 2. Chronic left side craniotomy. Progression since 2022 of cerebral white matter changes, most commonly due to chronic small vessel disease. Electronically Signed   By: Odessa Fleming M.D.   On: 08/27/2022 04:02    EKG: I independently viewed the EKG done and my findings are as followed: Sinus tachycardia up to 103.  Nonspecific ST-T changes.  QTc 440.  Assessment/Plan Present on Admission:  COVID-19 virus infection  Principal Problem:   COVID-19 virus infection  COVID-19 viral infection Currently not hypoxic Airborne precautions IV remdesivir x 3 days.  Paxlovid discontinued since unable to swallow safely. The patient's son gave consent for use of antiviral via phone. As needed bronchodilators As needed antitussives when able to swallow safely.  Acute metabolic encephalopathy suspect in the setting of COVID-19 viral infection Underlying dementia UA/urine culture pending to rule out other causes. Reorient as needed CT head was nonacute but showed the following findings: Chronic left side craniotomy. Progression since 2022 of cerebral white matter changes, most commonly due to chronic small vessel disease. Delirium, fall, and aspiration precautions are in place.  Unspecified  dysphagia Strict n.p.o. until passes a swallow evaluation Speech therapist consulted for swallow evaluation The patient had a barium swallow study done on 08/14/2022 which showed transient laryngeal penetration without aspiration, mild presbyesophagus.  The patient was unable to swallow the 13 mm barium tablet. Aspiration precautions are in place Home p.o. medications were not restarted to avoid aspiration, held, until the patient passes a swallow evaluation.  The son who was updated via phone was in agreement as the patient has had difficulty swallowing pills.  Type 2 diabetes with hyperlipidemia Insulin sliding scale every 4 hours while NPO. Resume home Crestor when able to swallow safely Resume feeding when able to swallow safely. Gentle IV fluid hydration NS at 50 cc/h x 1 day.  CKD 3A No recent prior records to compare Presented with creatinine 1.28 with GFR 56 Start gentle IV fluid hydration Monitor urine output Repeat BMP tomorrow morning  Dementia Parkinson's disease Resume home regimen when able to swallow safely  Chronic anxiety/depression Resume home regimen when able to swallow safely  Physical debility Fall at home, no signs of trauma on exam-CT head non acute. PT OT assessment Fall precautions   Time: 75 minutes.    DVT prophylaxis: Subcu Lovenox daily  Code Status: Full code as stated by the patient's son via phone.  Family Communication: None at bedside.  Updated the patient's son Bryson Dames via phone.  All questions answered to the best my ability.  Disposition Plan: Admitted to telemetry medical unit  Consults called: None.  Admission status: Observation status.   Status is: Observation    Darlin Drop MD [facility] Pager [phone]  If 7PM-7AM, please contact night-coverage [email or web address] Password [email or web address]  08/27/2022, 5:10 AM

## 2022-08-27 NOTE — Plan of Care (Signed)
  Problem: Respiratory: Goal: Will maintain a patent airway Outcome: Progressing   Problem: Coping: Goal: Ability to adjust to condition or change in health will improve Outcome: Progressing   Problem: Skin Integrity: Goal: Risk for impaired skin integrity will decrease Outcome: Progressing   Problem: Pain Managment: Goal: General experience of comfort will improve Outcome: Progressing   Problem: Safety: Goal: Ability to remain free from injury will improve Outcome: Progressing

## 2022-08-27 NOTE — ED Provider Notes (Signed)
[place] EMERGENCY DEPARTMENT AT [facility] Provider Note   CSN: [id number] Arrival date & time: 08/27/22  4098     History  Chief Complaint  Patient presents with   Altered Mental Status    Christian Morgan is a 82 y.o. male.  The history is provided by the EMS personnel and medical records.  Altered Mental Status Christian Morgan is a 82 y.o. male who presents to the Emergency Department complaining of altered mental status.  He presents to the emergency department by EMS for evaluation of altered mental status that began yesterday.  He has a history of dementia, Parkinson's, diabetes.  He lives at home with his wife and his wife has been ill with COVID-19.  He had been well until yesterday when he developed increased fatigue and last night he became altered and confused and had difficulty understanding how to swallow.  Patient complains of pain all over, no additional complaints.  Additional history was obtained from patient's son after patient's initial ED assessment.  Son reports that Christian Morgan has been more lethargic and drowsy throughout the day.  He states that around 9:00 he rolled out of bed and was assisted back into bed by EMS.  Later he developed a fever and had increased confusion, which prompted a second EMS call.  He does report that the patient at times has difficulty understanding how to swallow but is able to swallow food without difficulty.  His problem is mostly regarding pills, especially if he is given a pill right after he eats.    Home Medications Prior to Admission medications   Medication Sig Start Date End Date Taking? Authorizing Provider  acetaminophen (TYLENOL) 500 MG tablet Take 500 mg by mouth every 6 (six) hours as needed for moderate pain or headache.    [provider]  carbidopa-levodopa (SINEMET IR) 25-100 MG tablet TAKE 1 TABLET BY MOUTH THREE TIMES A DAY 11/06/21   Tat, Octaviano Batty, DO  carbidopa-levodopa (SINEMET IR)  25-100 MG tablet TAKE 1 TABLET BY MOUTH THREE TIMES A DAY 07/31/22   Tat, Octaviano Batty, DO  Coconut Oil 1000 MG CAPS Take by mouth.    [provider]  donepezil (ARICEPT) 10 MG tablet TAKE 1 TABLET BY MOUTH EVERYDAY AT BEDTIME 07/28/22   Tat, Rebecca S, DO  escitalopram (LEXAPRO) 10 MG tablet Take 5 mg by mouth daily.  12/08/19   [provider]  levocetirizine (XYZAL) 5 MG tablet Take 5 mg by mouth every evening.    [provider]  loratadine (CLARITIN) 10 MG tablet Take 10 mg by mouth daily.    [provider]  losartan (COZAAR) 100 MG tablet Take 100 mg by mouth daily.    [provider]  metFORMIN (GLUCOPHAGE-XR) 500 MG 24 hr tablet Take 1,000 mg by mouth 2 (two) times daily.    [provider]  QUEtiapine (SEROQUEL) 25 MG tablet Take 1 tablet (25 mg total) by mouth at bedtime. 03/03/22   Tat, Octaviano Batty, DO  rosuvastatin (CRESTOR) 5 MG tablet Take 5 mg by mouth daily.    [provider]  Turmeric 500 MG TABS 2 tablets    [provider]  vitamin B-12 (CYANOCOBALAMIN) 1000 MCG tablet Take 1,000 mcg by mouth daily.    [provider]      Allergies    Lipitor [atorvastatin], Penicillins, Amitriptyline hcl, Cyclobenzaprine, Gabapentin, Other, Zetia [ezetimibe], Aspirin, Doxycycline, Iodinated contrast media, and Levofloxacin  Review of Systems   Review of Systems  All other systems reviewed and are negative.   Physical Exam Updated Vital Signs BP (!) 146/74   Pulse 91   Temp (!) 101.5 F (38.6 C) (Oral)   Resp (!) 21   SpO2 100%  Physical Exam Vitals and nursing note reviewed.  Constitutional:      Appearance: He is well-developed.  HENT:     Head: Normocephalic and atraumatic.  Cardiovascular:     Rate and Rhythm: Regular rhythm. Tachycardia present.     Heart sounds: Murmur heard.  Pulmonary:     Effort: Pulmonary effort is normal. No respiratory distress.     Breath sounds: Normal breath sounds.   Abdominal:     Palpations: Abdomen is soft.     Tenderness: There is no abdominal tenderness. There is no guarding or rebound.  Musculoskeletal:        General: No tenderness.  Skin:    General: Skin is warm and dry.  Neurological:     Mental Status: He is alert.     Comments: Oriented to person.  Disoriented to time and recent events.  Global weakness but moves all extremities symmetrically.  He does have difficulty following simple commands.  Psychiatric:        Behavior: Behavior normal.     ED Results / Procedures / Treatments   Labs (all labs ordered are listed, but only abnormal results are displayed) Labs Reviewed  SARS CORONAVIRUS 2 BY RT PCR - Abnormal; Notable for the following components:      Result Value   SARS Coronavirus 2 by RT PCR POSITIVE (*)    All other components within normal limits  COMPREHENSIVE METABOLIC PANEL - Abnormal; Notable for the following components:   Sodium 132 (*)    Glucose, Bld 123 (*)    Creatinine, Ser 1.28 (*)    GFR, Estimated 56 (*)    All other components within normal limits  CBC WITH DIFFERENTIAL/PLATELET - Abnormal; Notable for the following components:   WBC 11.4 (*)    Neutro Abs 9.3 (*)    All other components within normal limits  CULTURE, BLOOD (ROUTINE X 2)  CULTURE, BLOOD (ROUTINE X 2)  LACTIC ACID, PLASMA  PROTIME-INR  APTT  LACTIC ACID, PLASMA  URINALYSIS, W/ REFLEX TO CULTURE (INFECTION SUSPECTED)    EKG EKG Interpretation  Date/Time:  Thursday August 27 2022 03:22:29 EDT Ventricular Rate:  103 PR Interval:  203 QRS Duration: 93 QT Interval:  336 QTC Calculation: 440 R Axis:   -63 Text Interpretation: Sinus tachycardia RSR' in V1 or V2, right VCD or RVH Inferior infarct, old Artifact in lead(s) I III aVR aVL Confirmed by Tilden Fossa [phone]) on 08/27/2022 3:23:47 AM  Radiology DG Chest Port 1 View  Result Date: 08/27/2022 CLINICAL DATA:  Questionable sepsis with weakness and confusion. EXAM: PORTABLE  CHEST 1 VIEW COMPARISON:  PA Lat 01/08/2010 FINDINGS: 4:02 a.m. The cardiac size is upper limits of normal. No vascular congestion is seen. There is mild aortic ectasia and tortuosity, otherwise unremarkable mediastinum. No substantial pleural effusion is evident. There is interval increased retrocardiac opacity in the left lower lobe which could be atelectasis, pneumonia or aspiration. Follow-up PA and lateral study is recommended to see if this persists. The remaining lungs are clear. Moderate thoracic spondylosis.  Multiple overlying monitor wires. IMPRESSION: Interval increased retrocardiac left lower lobe opacity which could be atelectasis, pneumonia or aspiration. Follow-up PA and lateral study recommended to see  if this persists. Electronically Signed   By: Almira Bar M.D.   On: 08/27/2022 04:51   CT Head Wo Contrast  Result Date: 08/27/2022 CLINICAL DATA:  82 year old male with altered mental status. EXAM: CT HEAD WITHOUT CONTRAST TECHNIQUE: Contiguous axial images were obtained from the base of the skull through the vertex without intravenous contrast. RADIATION DOSE REDUCTION: This exam was performed according to the departmental dose-optimization program which includes automated exposure control, adjustment of the mA and/or kV according to patient size and/or use of iterative reconstruction technique. COMPARISON:  Brain MRI 08/03/2019.  Head CT 09/19/2020. FINDINGS: Brain: Stable cerebral volume. No midline shift, ventriculomegaly, mass effect, evidence of mass lesion, intracranial hemorrhage or evidence of cortically based acute infarction. Patchy mild to moderate for age bilateral cerebral white matter hypodensity appears progressed from the previous exams. But otherwise stable gray-white differentiation throughout the brain. Vascular: Calcified atherosclerosis at the skull base. No suspicious intracranial vascular hyperdensity. Skull: Chronic left side craniotomy. No acute osseous abnormality  identified. Sinuses/Orbits: Visualized paranasal sinuses and mastoids are stable and well aerated; Mild chronic right maxillary mucosal thickening. Other: No acute orbit or scalp soft tissue finding. Chronic postoperative changes. IMPRESSION: 1. No acute intracranial abnormality. 2. Chronic left side craniotomy. Progression since 2022 of cerebral white matter changes, most commonly due to chronic small vessel disease. Electronically Signed   By: Odessa Fleming M.D.   On: 08/27/2022 04:02    Procedures Procedures    Medications Ordered in ED Medications  lactated ringers bolus 500 mL (500 mLs Intravenous New Bag/Given 08/27/22 0400)  acetaminophen (TYLENOL) suppository 650 mg (650 mg Rectal Given 08/27/22 0401)    ED Course/ Medical Decision Making/ A&P                             Medical Decision Making Amount and/or Complexity of Data Reviewed Labs: ordered. Radiology: ordered. ECG/medicine tests: ordered.  Risk OTC drugs. Decision regarding hospitalization.   Patient here for evaluation of altered mental status, fever.  He fell out of bed earlier today.  There are no external signs of trauma on examination.  He is very altered, tachycardic.  He is very globally weak and has difficulty following commands for strength testing.  He was noted to be febrile and was treated with acetaminophen PR due to concern that he is an aspiration risk.  CT head with chronic changes.  Chest x-ray with atelectasis versus pneumonia versus aspiration-images personally reviewed and interpreted, agree with radiologist interpretation..  No reported aspiration event at home.  CBC with mild leukocytosis.  BMP with mild hyponatremia, mild elevation in creatinine.  He is positive for COVID-19.  Given patient's significant weakness, confusion feel he will benefit from inpatient care.  No evidence of serious bacterial infection at this time, will hold antibiotics.  Patient and son updated findings of studies and plan to admit  for observation and supportive care.  Hospitalist consulted for admission.   Current clinical picture is not consistent with acute CVA, meningitis, PE, sepsis.       Final Clinical Impression(s) / ED Diagnoses Final diagnoses:  Disorientation  COVID-19    Rx / DC Orders ED Discharge Orders     None         Tilden Fossa, MD 08/27/22 [phone]

## 2022-08-27 NOTE — ED Triage Notes (Signed)
Pt is coming in for altered mental status with weakness, confusion, only answering questions with yes and no so far. Pt is unconfirmed positive but the wife is positive for COVID and has been sleeping in same bed as the wife. Pt did spike a fever lastnight, no tylenol given due to inability to take medications, pt also has a cough present. Chills present per medic and family reports. Hx of dementia   Medic Vitals   156/91 108 22 35eto2 106bgl 96%  Ns given in route   20g left wrist

## 2022-08-27 NOTE — Progress Notes (Signed)
PHARMACIST - PHYSICIAN ORDER COMMUNICATION  CONCERNING: P&T Medication Policy on Herbal Medications  DESCRIPTION:  This patient's order for:  Turmeric, Coconut Oil  has been noted.  This product(s) is classified as an "herbal" or natural product. Due to a lack of definitive safety studies or FDA approval, nonstandard manufacturing practices, plus the potential risk of unknown drug-drug interactions while on inpatient medications, the Pharmacy and Therapeutics Committee does not permit the use of "herbal" or natural products of this type within [facility].   ACTION TAKEN: The pharmacy department is unable to verify this order at this time and your patient has been informed of this safety policy. Please reevaluate patient's clinical condition at discharge and address if the herbal or natural product(s) should be resumed at that time.   Loralee Pacas, PharmD, BCPS 08/27/2022 5:07 PM

## 2022-08-28 DIAGNOSIS — Z79899 Other long term (current) drug therapy: Secondary | ICD-10-CM | POA: Diagnosis not present

## 2022-08-28 DIAGNOSIS — Z87891 Personal history of nicotine dependence: Secondary | ICD-10-CM | POA: Diagnosis not present

## 2022-08-28 DIAGNOSIS — E669 Obesity, unspecified: Secondary | ICD-10-CM | POA: Diagnosis present

## 2022-08-28 DIAGNOSIS — N1831 Chronic kidney disease, stage 3a: Secondary | ICD-10-CM | POA: Diagnosis present

## 2022-08-28 DIAGNOSIS — E785 Hyperlipidemia, unspecified: Secondary | ICD-10-CM | POA: Diagnosis present

## 2022-08-28 DIAGNOSIS — Z87442 Personal history of urinary calculi: Secondary | ICD-10-CM | POA: Diagnosis not present

## 2022-08-28 DIAGNOSIS — G9341 Metabolic encephalopathy: Secondary | ICD-10-CM | POA: Diagnosis present

## 2022-08-28 DIAGNOSIS — E1069 Type 1 diabetes mellitus with other specified complication: Secondary | ICD-10-CM | POA: Diagnosis present

## 2022-08-28 DIAGNOSIS — Z961 Presence of intraocular lens: Secondary | ICD-10-CM | POA: Diagnosis present

## 2022-08-28 DIAGNOSIS — R5381 Other malaise: Secondary | ICD-10-CM | POA: Diagnosis present

## 2022-08-28 DIAGNOSIS — W06XXXA Fall from bed, initial encounter: Secondary | ICD-10-CM | POA: Diagnosis present

## 2022-08-28 DIAGNOSIS — U071 COVID-19: Secondary | ICD-10-CM | POA: Diagnosis not present

## 2022-08-28 DIAGNOSIS — Z833 Family history of diabetes mellitus: Secondary | ICD-10-CM | POA: Diagnosis not present

## 2022-08-28 DIAGNOSIS — J309 Allergic rhinitis, unspecified: Secondary | ICD-10-CM | POA: Diagnosis present

## 2022-08-28 DIAGNOSIS — I129 Hypertensive chronic kidney disease with stage 1 through stage 4 chronic kidney disease, or unspecified chronic kidney disease: Secondary | ICD-10-CM | POA: Diagnosis present

## 2022-08-28 DIAGNOSIS — F32A Depression, unspecified: Secondary | ICD-10-CM | POA: Diagnosis present

## 2022-08-28 DIAGNOSIS — R1310 Dysphagia, unspecified: Secondary | ICD-10-CM | POA: Diagnosis present

## 2022-08-28 DIAGNOSIS — E869 Volume depletion, unspecified: Secondary | ICD-10-CM | POA: Diagnosis present

## 2022-08-28 DIAGNOSIS — E1022 Type 1 diabetes mellitus with diabetic chronic kidney disease: Secondary | ICD-10-CM | POA: Diagnosis present

## 2022-08-28 DIAGNOSIS — E871 Hypo-osmolality and hyponatremia: Secondary | ICD-10-CM | POA: Diagnosis present

## 2022-08-28 DIAGNOSIS — G20A1 Parkinson's disease without dyskinesia, without mention of fluctuations: Secondary | ICD-10-CM | POA: Diagnosis present

## 2022-08-28 DIAGNOSIS — G4733 Obstructive sleep apnea (adult) (pediatric): Secondary | ICD-10-CM | POA: Diagnosis present

## 2022-08-28 DIAGNOSIS — R410 Disorientation, unspecified: Secondary | ICD-10-CM | POA: Diagnosis present

## 2022-08-28 DIAGNOSIS — Z7984 Long term (current) use of oral hypoglycemic drugs: Secondary | ICD-10-CM | POA: Diagnosis not present

## 2022-08-28 DIAGNOSIS — F0284 Dementia in other diseases classified elsewhere, unspecified severity, with anxiety: Secondary | ICD-10-CM | POA: Diagnosis present

## 2022-08-28 DIAGNOSIS — F0283 Dementia in other diseases classified elsewhere, unspecified severity, with mood disturbance: Secondary | ICD-10-CM | POA: Diagnosis present

## 2022-08-28 LAB — CBC WITH DIFFERENTIAL/PLATELET
Abs Immature Granulocytes: 0.06 10*3/uL (ref 0.00–0.07)
Basophils Absolute: 0.1 10*3/uL (ref 0.0–0.1)
Basophils Relative: 1 %
Eosinophils Absolute: 0 10*3/uL (ref 0.0–0.5)
Eosinophils Relative: 0 %
HCT: 38.5 % — ABNORMAL LOW (ref 39.0–52.0)
Hemoglobin: 12.7 g/dL — ABNORMAL LOW (ref 13.0–17.0)
Immature Granulocytes: 1 %
Lymphocytes Relative: 9 %
Lymphs Abs: 1 10*3/uL (ref 0.7–4.0)
MCH: 29.5 pg (ref 26.0–34.0)
MCHC: 33 g/dL (ref 30.0–36.0)
MCV: 89.3 fL (ref 80.0–100.0)
Monocytes Absolute: 0.9 10*3/uL (ref 0.1–1.0)
Monocytes Relative: 9 %
Neutro Abs: 8.3 10*3/uL — ABNORMAL HIGH (ref 1.7–7.7)
Neutrophils Relative %: 80 %
Platelets: 222 10*3/uL (ref 150–400)
RBC: 4.31 MIL/uL (ref 4.22–5.81)
RDW: 14.1 % (ref 11.5–15.5)
WBC: 10.3 10*3/uL (ref 4.0–10.5)
nRBC: 0 % (ref 0.0–0.2)

## 2022-08-28 LAB — URINALYSIS, W/ REFLEX TO CULTURE (INFECTION SUSPECTED)
Bacteria, UA: NONE SEEN
Bilirubin Urine: NEGATIVE
Glucose, UA: NEGATIVE mg/dL
Ketones, ur: 5 mg/dL — AB
Leukocytes,Ua: NEGATIVE
Nitrite: NEGATIVE
Protein, ur: NEGATIVE mg/dL
Specific Gravity, Urine: 1.016 (ref 1.005–1.030)
pH: 5 (ref 5.0–8.0)

## 2022-08-28 LAB — COMPREHENSIVE METABOLIC PANEL
ALT: 15 U/L (ref 0–44)
AST: 22 U/L (ref 15–41)
Albumin: 3.2 g/dL — ABNORMAL LOW (ref 3.5–5.0)
Alkaline Phosphatase: 49 U/L (ref 38–126)
Anion gap: 13 (ref 5–15)
BUN: 11 mg/dL (ref 8–23)
CO2: 22 mmol/L (ref 22–32)
Calcium: 8.9 mg/dL (ref 8.9–10.3)
Chloride: 99 mmol/L (ref 98–111)
Creatinine, Ser: 1.23 mg/dL (ref 0.61–1.24)
GFR, Estimated: 58 mL/min — ABNORMAL LOW (ref >60)
Glucose, Bld: 139 mg/dL — ABNORMAL HIGH (ref 70–99)
Potassium: 3.5 mmol/L (ref 3.5–5.1)
Sodium: 134 mmol/L — ABNORMAL LOW (ref 135–145)
Total Bilirubin: 0.8 mg/dL (ref 0.3–1.2)
Total Protein: 6.6 g/dL (ref 6.5–8.1)

## 2022-08-28 LAB — PHOSPHORUS: Phosphorus: 2.7 mg/dL (ref 2.5–4.6)

## 2022-08-28 LAB — HEMOGLOBIN A1C
Hgb A1c MFr Bld: 7.2 % — ABNORMAL HIGH (ref 4.8–5.6)
Mean Plasma Glucose: 160 mg/dL

## 2022-08-28 LAB — GLUCOSE, CAPILLARY
Glucose-Capillary: 104 mg/dL — ABNORMAL HIGH (ref 70–99)
Glucose-Capillary: 110 mg/dL — ABNORMAL HIGH (ref 70–99)
Glucose-Capillary: 128 mg/dL — ABNORMAL HIGH (ref 70–99)
Glucose-Capillary: 146 mg/dL — ABNORMAL HIGH (ref 70–99)
Glucose-Capillary: 155 mg/dL — ABNORMAL HIGH (ref 70–99)
Glucose-Capillary: 158 mg/dL — ABNORMAL HIGH (ref 70–99)
Glucose-Capillary: 55 mg/dL — ABNORMAL LOW (ref 70–99)
Glucose-Capillary: 93 mg/dL (ref 70–99)

## 2022-08-28 LAB — SEDIMENTATION RATE: Sed Rate: 36 mm/hr — ABNORMAL HIGH (ref 0–16)

## 2022-08-28 LAB — CULTURE, BLOOD (ROUTINE X 2)

## 2022-08-28 LAB — D-DIMER, QUANTITATIVE: D-Dimer, Quant: 0.89 ug/mL-FEU — ABNORMAL HIGH (ref 0.00–0.50)

## 2022-08-28 LAB — MAGNESIUM: Magnesium: 1.6 mg/dL — ABNORMAL LOW (ref 1.7–2.4)

## 2022-08-28 LAB — C-REACTIVE PROTEIN: CRP: 11.5 mg/dL — ABNORMAL HIGH (ref <1.0)

## 2022-08-28 MED ORDER — DEXTROSE 5 % IV SOLN
INTRAVENOUS | Status: DC
  Filled 2022-08-28: qty 1000

## 2022-08-28 MED ORDER — ACETAMINOPHEN 650 MG RE SUPP
650.0000 mg | RECTAL | Status: DC | PRN
  Administered 2022-08-28: 650 mg via RECTAL

## 2022-08-28 MED ORDER — POTASSIUM CHLORIDE 20 MEQ PO PACK
40.0000 meq | PACK | Freq: Once | ORAL | Status: AC
  Administered 2022-08-28: 40 meq via ORAL
  Filled 2022-08-28: qty 2

## 2022-08-28 MED ORDER — DEXAMETHASONE 6 MG PO TABS
6.0000 mg | ORAL_TABLET | Freq: Every day | ORAL | Status: DC
  Administered 2022-08-28 – 2022-08-31 (×4): 6 mg via ORAL
  Filled 2022-08-28 (×4): qty 1

## 2022-08-28 MED ORDER — DEXTROSE-SODIUM CHLORIDE 5-0.9 % IV SOLN: INTRAVENOUS | Status: AC

## 2022-08-28 MED ORDER — DEXTROSE 50 % IV SOLN
12.5000 g | INTRAVENOUS | Status: AC
  Administered 2022-08-28: 12.5 g via INTRAVENOUS
  Filled 2022-08-28: qty 50

## 2022-08-28 MED ORDER — ENSURE ENLIVE PO LIQD
237.0000 mL | Freq: Two times a day (BID) | ORAL | Status: DC
  Administered 2022-08-28 (×2): 237 mL via ORAL

## 2022-08-28 MED ORDER — MAGNESIUM SULFATE 2 GM/50ML IV SOLN
2.0000 g | Freq: Once | INTRAVENOUS | Status: AC
  Administered 2022-08-28: 2 g via INTRAVENOUS
  Filled 2022-08-28: qty 50

## 2022-08-28 NOTE — Progress Notes (Addendum)
Patients 0000 CBG was 55. Upon my assessment patient was alert, and no s/s of distress. The hypoglycemic protocol was initiated, and NP notified.  0057 CBG is now110 patient resting comfortable.   08/28/22 0032  Provider Notification  Provider Name/Title Chavez,NP  Date Provider Notified 08/28/22  Time Provider Notified 0030  Method of Notification Page  Notification Reason Critical Result  Test performed and critical result CBG 55  Date Critical Result Received 08/28/22  Time Critical Result Received 0003  Provider response No new orders;Other (Comment) (initaied hypoglycemic protocol)  Date of Provider Response 08/28/22  Time of Provider Response [phone]

## 2022-08-28 NOTE — Progress Notes (Addendum)
Physical Therapy Treatment Patient Details Name: Christian Morgan MRN: [id number] DOB: 1940-07-22 Today's Date: 08/28/2022   History of Present Illness 82 y.o. male admitted 6/27 from home due to altered mental status x 1 day and after rolling out of bed at home overnight, found to be COVID +.Past medical history significant for dementia, Parkinson's disease, unspecified dysphagia, history of gross hematuria and nephrolithiasis, chronic anxiety/depression, hyperlipidemia, GERD.    PT Comments    Very minor progress from prior visit, showing improved engagement with bed mobility, and better tolerance with seated balance at EOB. Still needs intermittent assist due to posterior LOB. Sat EOB >10 minutes today. Was unable to engage in standing with stedy despite multimodal cues to facilitate. SpO2 slightly lower with activity today (94-95% on RA today, compared to last visit up to 100%.) Returned to bed HOB 40 deg. Patient will continue to benefit from skilled physical therapy services to further improve independence with functional mobility. Patient will continue to benefit from skilled physical therapy services to further improve independence with functional mobility. Patient will benefit from continued inpatient follow up therapy, <3 hours/day.    Recommendations for follow up therapy are one component of a multi-disciplinary discharge planning process, led by the attending physician.  Recommendations may be updated based on patient status, additional functional criteria and insurance authorization.  Follow Up Recommendations  Can patient physically be transported by private vehicle: No    Assistance Recommended at Discharge Frequent or constant Supervision/Assistance  Patient can return home with the following Two people to help with walking and/or transfers;Two people to help with bathing/dressing/bathroom;Assistance with feeding;Assistance with cooking/housework;Direct supervision/assist for  medications management;Direct supervision/assist for financial management;Assist for transportation;Help with stairs or ramp for entrance   Equipment Recommendations  None recommended by PT    Recommendations for Other Services       Precautions / Restrictions Precautions Precautions: Fall;Other (comment) Precaution Comments: COVID + Restrictions Weight Bearing Restrictions: No     Mobility  Bed Mobility Overal bed mobility: Needs Assistance Bed Mobility: Rolling, Sidelying to Sit, Sit to Sidelying Rolling: Mod assist Sidelying to sit: Max assist     Sit to sidelying: Mod assist General bed mobility comments: Mod assist to roll left and right in bed for repositioning. Assisted LEs off bed, and max assist for trunk support to rise to EOB, however showing improved engagement from prior visit. Mod assist for LE support into bed, able to lift approx 50% back into bed with VC.    Transfers Overall transfer level: Needs assistance Equipment used: Ambulation equipment used Transfers: Sit to/from Stand Sit to Stand: Total assist, From elevated surface           General transfer comment: Unable to rise with stedy, poorly following commands despite multimodal cues.    Ambulation/Gait                   Stairs             Wheelchair Mobility    Modified Rankin (Stroke Patients Only)       Balance Overall balance assessment: Needs assistance, History of Falls Sitting-balance support: Feet supported, No upper extremity supported Sitting balance-Leahy Scale: Poor Sitting balance - Comments: Sat EOB >10 minutes with intermittent assist for posterior LOB. Tolerated bil hand support on Stedy, but progressed for short periods with hands in lap. Postural control: Posterior lean  Cognition Arousal/Alertness: Awake/alert Behavior During Therapy: WFL for tasks assessed/performed Overall Cognitive Status: No  family/caregiver present to determine baseline cognitive functioning                                          Exercises Other Exercises Other Exercises: Seated balance EOB, working on forward flexion and finding COB with intermittent assist.    General Comments General comments (skin integrity, edema, etc.): SpO2 94-95% on RA during session. Gargling at times and would cough but unable to expextorate any significant mucous. not following commands to cough into towel when assisted.      Pertinent Vitals/Pain Pain Assessment Pain Assessment: PAINAD Breathing: normal Negative Vocalization: none Facial Expression: smiling or inexpressive Body Language: relaxed Consolability: no need to console PAINAD Score: 0 Pain Intervention(s): Monitored during session, Repositioned, Limited activity within patient's tolerance    Home Living                          Prior Function            PT Goals (current goals can now be found in the care plan section) Acute Rehab PT Goals Patient Stated Goal: none stated PT Goal Formulation: With patient/family Time For Goal Achievement: 09/10/22 Potential to Achieve Goals: Good Progress towards PT goals: Progressing toward goals    Frequency    Min 3X/week      PT Plan Current plan remains appropriate    Co-evaluation              AM-PAC PT "6 Clicks" Mobility   Outcome Measure  Help needed turning from your back to your side while in a flat bed without using bedrails?: A Lot Help needed moving from lying on your back to sitting on the side of a flat bed without using bedrails?: A Lot Help needed moving to and from a bed to a chair (including a wheelchair)?: Total Help needed standing up from a chair using your arms (e.g., wheelchair or bedside chair)?: Total Help needed to walk in hospital room?: Total Help needed climbing 3-5 steps with a railing? : Total 6 Click Score: 8    End of Session  Equipment Utilized During Treatment: Gait belt Activity Tolerance: Patient limited by lethargy;Patient limited by fatigue Patient left: in bed;with call bell/phone within reach;with bed alarm set Nurse Communication: Mobility status;Need for lift equipment PT Visit Diagnosis: Muscle weakness (generalized) (M62.81);History of falling (Z91.81);Difficulty in walking, not elsewhere classified (R26.2);Other symptoms and signs involving the nervous system (R29.898)     Time: 4098-1191 PT Time Calculation (min) (ACUTE ONLY): 23 min  Charges:  $Therapeutic Activity: 8-22 mins $Neuromuscular Re-education: 8-22 mins                     Kathlyn Sacramento, PT, DPT [facility]  Rehabilitation Services Physical Therapist Office: [phone] Website: [email or web address]    Berton Mount 08/28/2022, 6:29 PM

## 2022-08-28 NOTE — Progress Notes (Signed)
PROGRESS NOTE    Christian Morgan  RUE:[id number] DOB: 09/20/1940 DOA: 08/27/2022 PCP: [name], MD  Outpatient Specialists:     Brief Narrative:  Patient is an 82 year old male with past medical history significant for dementia, Parkinson's disease, unspecified dysphagia, gross hematuria, nephrolithiasis, hyperlipidemia and GERD.  Patient was admitted with COVID-19 infection.  Patient is currently on IV remdesivir.  Elevated inflammatory markers noted today.  Will start patient on dexamethasone 6 Mg p.o. once daily.  Fever has resolved.  Patient is more interactive today, however, still unable to give any history.  Speech evaluation is appreciated.  Continue to monitor blood sugar closely.  Caloric count.  Decrease IV fluids to 30 cc/h.   Assessment & Plan:   Principal Problem:   COVID-19 virus infection Active Problems:   COVID-19   COVID-19 viral infection -Continue remdesivir. -Fever is resolving. -Elevated inflammatory markers. -Dexamethasone 6 Mg p.o. once daily. -Supportive care. -Follow inflammatory markers.  Currently not hypoxic Airborne precautions   Acute metabolic encephalopathy suspect in the setting of COVID-19 viral infection -Baseline unknown. -Seems to be improving. -Underlying dementia -UA is nonrevealing, except for likely volume depletion. -Urine cultures negative to date.   -CT head was nonacute but showed the following findings: Chronic left side craniotomy. Progression since 2022 of cerebral white matter changes, most commonly due to chronic small vessel disease. Delirium, fall, and aspiration precautions are in place.   Unspecified dysphagia -Speech evaluation is appreciated. -Query related to Parkinson's disease.  Will review prior records. -Aspiration precautions.  S -The patient had a barium swallow study done on 08/14/2022 which showed transient laryngeal penetration without aspiration, mild presbyesophagus.  The patient was unable to  swallow the 13 mm barium tablet.   Type 2 diabetes with hyperlipidemia -Continue to monitor CBG. -IV fluid at 30 cc/h. -Caloric count. -Continue to monitor and optimize.     CKD 3A No recent prior records to compare Presented with creatinine 1.28 with GFR 56 Start gentle IV fluid hydration Serum creatinine today, 08/28/2022 is 1.23 with GFR of 58. -Renal panel in the morning.   Dementia Parkinson's disease Resume home regimen when able to swallow safely Avoid mind altering medications whenever possible.   Chronic anxiety/depression Resume home regimen when able to swallow safely Avoid mind altering medications as much as possible.   Physical debility Fall at home, no signs of trauma on exam-CT head non acute. PT OT assessment Fall precautions     DVT prophylaxis: Subcutaneous Lovenox. Code Status: Full code. Family Communication:  Disposition Plan: This will depend on hospital course.   Consultants:  None.  Procedures:  None.  Antimicrobials:  None.   Subjective: Significant history from patient.  Objective: Vitals:   08/28/22 0400 08/28/22 0539 08/28/22 0745 08/28/22 1618  BP: (!) 152/67  136/88 132/73  Pulse: 89  85 94  Resp: 18  16 16   Temp: (!) 102.6 F (39.2 C) (!) 101.1 F (38.4 C) 98.2 F (36.8 C) 98.3 F (36.8 C)  TempSrc: Oral Oral Oral Oral  SpO2: 97%  95% 97%  Weight:        Intake/Output Summary (Last 24 hours) at 08/28/2022 2036 Last data filed at 08/28/2022 1700 Gross per 24 hour  Intake 788.17 ml  Output 900 ml  Net -111.83 ml   Filed Weights   08/27/22 0640  Weight: 95.7 kg    Examination:  General exam: Patient remains mildly sleepy versus lethargic, baseline is unknown.  Patient seems better today. Respiratory system:  Decreased air entry globally. Cardiovascular system: S1 & S2 heard Gastrointestinal system: Abdomen is obese, soft and nontender.  Central nervous system: Sleepy/mildly lethargic.     Data Reviewed: I  have personally reviewed following labs and imaging studies  CBC: Recent Labs  Lab 08/27/22 0336 08/28/22 0822  WBC 11.4* 10.3  NEUTROABS 9.3* 8.3*  HGB 13.2 12.7*  HCT 40.4 38.5*  MCV 90.4 89.3  PLT 281 222   Basic Metabolic Panel: Recent Labs  Lab 08/27/22 0336 08/28/22 0822  NA 132* 134*  K 3.8 3.5  CL 99 99  CO2 23 22  GLUCOSE 123* 139*  BUN 16 11  CREATININE 1.28* 1.23  CALCIUM 9.5 8.9  MG  --  1.6*  PHOS  --  2.7   GFR: Estimated Creatinine Clearance: 49.3 mL/min (by C-G formula based on SCr of 1.23 mg/dL). Liver Function Tests: Recent Labs  Lab 08/27/22 0336 08/28/22 0822  AST 21 22  ALT 16 15  ALKPHOS 55 49  BILITOT 0.7 0.8  PROT 7.5 6.6  ALBUMIN 4.0 3.2*   No results for input(s): "LIPASE", "AMYLASE" in the last 168 hours. No results for input(s): "AMMONIA" in the last 168 hours. Coagulation Profile: Recent Labs  Lab 08/27/22 0336  INR 1.1   Cardiac Enzymes: No results for input(s): "CKTOTAL", "CKMB", "CKMBINDEX", "TROPONINI" in the last 168 hours. BNP (last 3 results) No results for input(s): "PROBNP" in the last 8760 hours. HbA1C: Recent Labs    08/27/22 0336  HGBA1C 7.2*   CBG: Recent Labs  Lab 08/28/22 0356 08/28/22 0936 08/28/22 1206 08/28/22 1615 08/28/22 2023  GLUCAP 93 155* 146* 128* 158*   Lipid Profile: No results for input(s): "CHOL", "HDL", "LDLCALC", "TRIG", "CHOLHDL", "LDLDIRECT" in the last 72 hours. Thyroid Function Tests: No results for input(s): "TSH", "T4TOTAL", "FREET4", "T3FREE", "THYROIDAB" in the last 72 hours. Anemia Panel: No results for input(s): "VITAMINB12", "FOLATE", "FERRITIN", "TIBC", "IRON", "RETICCTPCT" in the last 72 hours. Urine analysis:    Component Value Date/Time   COLORURINE YELLOW 08/28/2022 0013   APPEARANCEUR CLEAR 08/28/2022 0013   LABSPEC 1.016 08/28/2022 0013   PHURINE 5.0 08/28/2022 0013   GLUCOSEU NEGATIVE 08/28/2022 0013   HGBUR SMALL (A) 08/28/2022 0013   BILIRUBINUR  NEGATIVE 08/28/2022 0013   KETONESUR 5 (A) 08/28/2022 0013   PROTEINUR NEGATIVE 08/28/2022 0013   UROBILINOGEN 1.0 03/14/2022 1324   NITRITE NEGATIVE 08/28/2022 0013   LEUKOCYTESUR NEGATIVE 08/28/2022 0013   Sepsis Labs: @LABRCNTIP (procalcitonin:4,lacticidven:4)  ) Recent Results (from the past 240 hour(s))  Blood Culture (routine x 2)     Status: None (Preliminary result)   Collection Time: 08/27/22  3:26 AM   Specimen: BLOOD  Result Value Ref Range Status   Specimen Description BLOOD BLOOD LEFT ARM  Final   Special Requests   Final    BOTTLES DRAWN AEROBIC AND ANAEROBIC Blood Culture adequate volume   Culture   Final    NO GROWTH 1 DAY Performed at [facility], [address], [place], [place] [address]    Report Status PENDING  Incomplete  SARS Coronavirus 2 by RT PCR (hospital order, performed in [facility] hospital lab) *cepheid single result test* Anterior Nasal Swab     Status: Abnormal   Collection Time: 08/27/22  3:27 AM   Specimen: Anterior Nasal Swab  Result Value Ref Range Status   SARS Coronavirus 2 by RT PCR POSITIVE (A) NEGATIVE Final    Comment: Performed at [facility], [address], [place], [place] [address]  Blood Culture (routine x 2)     Status: None (Preliminary result)   Collection Time: 08/27/22  3:31 AM   Specimen: BLOOD  Result Value Ref Range Status   Specimen Description BLOOD BLOOD RIGHT ARM  Final   Special Requests   Final    BOTTLES DRAWN AEROBIC AND ANAEROBIC Blood Culture adequate volume   Culture   Final    NO GROWTH 1 DAY Performed at [facility], [address], [place], [place] [address]    Report Status PENDING  Incomplete         Radiology Studies: DG Chest Port 1 View  Result Date: 08/27/2022 CLINICAL DATA:  Questionable sepsis with weakness and confusion. EXAM: PORTABLE CHEST 1 VIEW COMPARISON:  PA Lat 01/08/2010 FINDINGS: 4:02 a.m. The cardiac size is upper limits of normal. No vascular congestion  is seen. There is mild aortic ectasia and tortuosity, otherwise unremarkable mediastinum. No substantial pleural effusion is evident. There is interval increased retrocardiac opacity in the left lower lobe which could be atelectasis, pneumonia or aspiration. Follow-up PA and lateral study is recommended to see if this persists. The remaining lungs are clear. Moderate thoracic spondylosis.  Multiple overlying monitor wires. IMPRESSION: Interval increased retrocardiac left lower lobe opacity which could be atelectasis, pneumonia or aspiration. Follow-up PA and lateral study recommended to see if this persists. Electronically Signed   By: Almira Bar M.D.   On: 08/27/2022 04:51   CT Head Wo Contrast  Result Date: 08/27/2022 CLINICAL DATA:  82 year old male with altered mental status. EXAM: CT HEAD WITHOUT CONTRAST TECHNIQUE: Contiguous axial images were obtained from the base of the skull through the vertex without intravenous contrast. RADIATION DOSE REDUCTION: This exam was performed according to the departmental dose-optimization program which includes automated exposure control, adjustment of the mA and/or kV according to patient size and/or use of iterative reconstruction technique. COMPARISON:  Brain MRI 08/03/2019.  Head CT 09/19/2020. FINDINGS: Brain: Stable cerebral volume. No midline shift, ventriculomegaly, mass effect, evidence of mass lesion, intracranial hemorrhage or evidence of cortically based acute infarction. Patchy mild to moderate for age bilateral cerebral white matter hypodensity appears progressed from the previous exams. But otherwise stable gray-white differentiation throughout the brain. Vascular: Calcified atherosclerosis at the skull base. No suspicious intracranial vascular hyperdensity. Skull: Chronic left side craniotomy. No acute osseous abnormality identified. Sinuses/Orbits: Visualized paranasal sinuses and mastoids are stable and well aerated; Mild chronic right maxillary  mucosal thickening. Other: No acute orbit or scalp soft tissue finding. Chronic postoperative changes. IMPRESSION: 1. No acute intracranial abnormality. 2. Chronic left side craniotomy. Progression since 2022 of cerebral white matter changes, most commonly due to chronic small vessel disease. Electronically Signed   By: Odessa Fleming M.D.   On: 08/27/2022 04:02        Scheduled Meds:  carbidopa-levodopa  1 tablet Oral TID   cyanocobalamin  1,000 mcg Oral Daily   dexamethasone  6 mg Oral Daily   donepezil  10 mg Oral QHS   enoxaparin (LOVENOX) injection  40 mg Subcutaneous Q24H   escitalopram  5 mg Oral Daily   feeding supplement  237 mL Oral BID BM   insulin aspart  0-9 Units Subcutaneous Q4H   losartan  100 mg Oral Daily   rosuvastatin  5 mg Oral QPM   Continuous Infusions:  dextrose     remdesivir 100 mg in sodium chloride 0.9 % 100 mL IVPB 100 mg (08/28/22 1107)  LOS: 0 days    Time spent: 35 minutes.    Dana Allan, MD  [facility] Pager #: [phone] 7PM-7AM contact night coverage as above

## 2022-08-28 NOTE — Inpatient Diabetes Management (Signed)
Inpatient Diabetes Program Recommendations  AACE/ADA: New Consensus Statement on Inpatient Glycemic Control (2015)  Target Ranges:  Prepandial:   less than 140 mg/dL      Peak postprandial:   less than 180 mg/dL (1-2 hours)      Critically ill patients:  140 - 180 mg/dL   Lab Results  Component Value Date   GLUCAP 155 (H) 08/28/2022   HGBA1C 7.2 (H) 08/27/2022    Review of Glycemic Control  Latest Reference Range & Units 08/27/22 09:33 08/27/22 11:53 08/27/22 18:04 08/27/22 20:45 08/28/22 00:03 08/28/22 00:56 08/28/22 02:22 08/28/22 03:56 08/28/22 09:36  Glucose-Capillary 70 - 99 mg/dL 782 (H) 956 (H) 213 (H) 108 (H) 55 (L) 110 (H) 104 (H) 93 155 (H)   Diabetes history: DM 2 Outpatient Diabetes medications: metformin 1000 mg bid Current orders for Inpatient glycemic control:  Novolog 0-9 units Q4 hours  A1c 7.2% 6/27 Ensure enlive bid between meals (40 grams of carbohydrate)  Note: Hypoglycemia on current Novolog Correction scale, elevated renal function, pt eating now  Inpatient Diabetes Program Recommendations:    -   Consider reducing Novolog Correction to "very sensitive" 0-6 units tid + hs  Thanks,  Christena Deem RN, MSN, BC-ADM Inpatient Diabetes Coordinator Team Pager [phone] (8a-5p)

## 2022-08-28 NOTE — Evaluation (Signed)
Clinical/Bedside Swallow Evaluation Patient Details  Name: Ida Venkataraman MRN: [id number] Date of Birth: 03-26-1940  Today's Date: 08/28/2022 Time: SLP Start Time (ACUTE ONLY): 1478 SLP Stop Time (ACUTE ONLY): 0950 SLP Time Calculation (min) (ACUTE ONLY): 14 min  Past Medical History:  Past Medical History:  Diagnosis Date   Allergic rhinitis    Chronic headache    Complication of anesthesia    after eye surgery, had trouble catching breath   Daytime somnolence 01/05/2017   Essential hypertension 10/31/2009   Hard of hearing    Hyperglycemia due to diabetes mellitus 09/20/2017   Hyperlipidemia    Hypertension    Major neurocognitive disorder, unclear etiology 08/02/2019   OSA (obstructive sleep apnea) 2011   s/p septoplasty; was supposed to wear cpap but better after septoplasty   Type I diabetes mellitus 10/31/2009   Past Surgical History:  Past Surgical History:  Procedure Laterality Date   BACK SURGERY     COLONOSCOPY     EYE SURGERY Bilateral    cataract surgery with lens implants   NASAL SEPTOPLASTY W/ TURBINOPLASTY Bilateral 01/11/2019   Procedure: NASAL SEPTOPLASTY;  Surgeon: Newman Pies, MD;  Location: [facility] OR;  Service: ENT;  Laterality: Bilateral;   S/p Crani for CHI     subdural hematoma    SHOULDER SURGERY  2009   Right   TURBINATE REDUCTION Bilateral 01/11/2019   Procedure: Turbinate Reduction;  Surgeon: Newman Pies, MD;  Location: [facility] OR;  Service: ENT;  Laterality: Bilateral;   HPI:  82 y.o. male admitted 6/27 from home due to altered mental status x 1 day and after rolling out of bed at home overnight, found to be COVID +.Past medical history significant for dementia, Parkinson's disease, unspecified dysphagia, history of gross hematuria and nephrolithiasis, chronic anxiety/depression, hyperlipidemia, GERD.    Assessment / Plan / Recommendation  Clinical Impression  Pt encountered awake but lethargic with decreased secretion management marked by congested/gurgly  respirations (question pharyngeal congestion). He is on room air and was not short of breath at baseline given he has Covid. He did not follow commands for oromotor exam fully however no asymmetry noted. Weak volitional cough. Dentition appreciated but unable to determine if completely intact. He was given cup and straw sips thin, applesauce and graham cracker solid. There was no cough or throat clear throughout eval but suspect he may have delayed swallow initiation and there were subsequent swallows which may reflect pharyngeal residue. Pt has history of Parkinson's increasing aspiration risk. He was able to masticate small pieces of graham cracker without residue. Recommend he initiate Dys 2 texture, nectar thick liquids, crush pills, eat when adequately awake with full assist as he began to fall asleep during assessment. ST will continue to follow for advancement at bedside or need for instrumental assessment. SLP Visit Diagnosis: Dysphagia, unspecified (R13.10)    Aspiration Risk  Moderate aspiration risk    Diet Recommendation Dysphagia 2 (Fine chop);Nectar-thick liquid    Liquid Administration via: Straw;Cup Medication Administration: Crushed with puree Supervision: Full supervision/cueing for compensatory strategies;Staff to assist with self feeding Compensations: Slow rate;Small sips/bites;Lingual sweep for clearance of pocketing Postural Changes: Seated upright at 90 degrees    Other  Recommendations Oral Care Recommendations: Oral care BID    Recommendations for follow up therapy are one component of a multi-disciplinary discharge planning process, led by the attending physician.  Recommendations may be updated based on patient status, additional functional criteria and insurance authorization.  Follow up Recommendations  (TBD)  Assistance Recommended at Discharge    Functional Status Assessment Patient has had a recent decline in their functional status and demonstrates the  ability to make significant improvements in function in a reasonable and predictable amount of time.  Frequency and Duration min 2x/week  2 weeks       Prognosis Prognosis for improved oropharyngeal function: Good      Swallow Study   General HPI: 82 y.o. male admitted 6/27 from home due to altered mental status x 1 day and after rolling out of bed at home overnight, found to be COVID +.Past medical history significant for dementia, Parkinson's disease, unspecified dysphagia, history of gross hematuria and nephrolithiasis, chronic anxiety/depression, hyperlipidemia, GERD. Type of Study: Bedside Swallow Evaluation Previous Swallow Assessment:  (none) Diet Prior to this Study: NPO Temperature Spikes Noted: No Respiratory Status: Room air History of Recent Intubation: No Behavior/Cognition: Lethargic/Drowsy;Cooperative;Pleasant mood;Requires cueing (but awake) Oral Cavity Assessment: Within Functional Limits Oral Care Completed by SLP: No Oral Cavity - Dentition:  (would not open mouth to see full dentition) Vision: Functional for self-feeding Self-Feeding Abilities: Needs assist Patient Positioning: Upright in bed Baseline Vocal Quality: Low vocal intensity (possibly hyponasal) Volitional Cough: Weak    Oral/Motor/Sensory Function Overall Oral Motor/Sensory Function:  (did not follow commands- no overt abnormalitites)   Ice Chips Ice chips: Not tested   Thin Liquid Thin Liquid: Impaired Presentation: Straw;Cup Pharyngeal  Phase Impairments: Multiple swallows;Suspected delayed Swallow    Nectar Thick Nectar Thick Liquid: Not tested   Honey Thick Honey Thick Liquid: Not tested   Puree Puree: Impaired Pharyngeal Phase Impairments: Multiple swallows   Solid     Solid: Within functional limits      Royce Macadamia 08/28/2022,10:14 AM

## 2022-08-29 DIAGNOSIS — U071 COVID-19: Secondary | ICD-10-CM | POA: Diagnosis not present

## 2022-08-29 LAB — CBC WITH DIFFERENTIAL/PLATELET
Abs Immature Granulocytes: 0.04 10*3/uL (ref 0.00–0.07)
Basophils Absolute: 0.1 10*3/uL (ref 0.0–0.1)
Basophils Relative: 0 %
Eosinophils Absolute: 0.1 10*3/uL (ref 0.0–0.5)
Eosinophils Relative: 1 %
HCT: 38.8 % — ABNORMAL LOW (ref 39.0–52.0)
Hemoglobin: 12.9 g/dL — ABNORMAL LOW (ref 13.0–17.0)
Immature Granulocytes: 0 %
Lymphocytes Relative: 6 %
Lymphs Abs: 0.7 10*3/uL (ref 0.7–4.0)
MCH: 30.2 pg (ref 26.0–34.0)
MCHC: 33.2 g/dL (ref 30.0–36.0)
MCV: 90.9 fL (ref 80.0–100.0)
Monocytes Absolute: 0.9 10*3/uL (ref 0.1–1.0)
Monocytes Relative: 8 %
Neutro Abs: 9.8 10*3/uL — ABNORMAL HIGH (ref 1.7–7.7)
Neutrophils Relative %: 85 %
Platelets: 229 10*3/uL (ref 150–400)
RBC: 4.27 MIL/uL (ref 4.22–5.81)
RDW: 14.2 % (ref 11.5–15.5)
WBC: 11.6 10*3/uL — ABNORMAL HIGH (ref 4.0–10.5)
nRBC: 0 % (ref 0.0–0.2)

## 2022-08-29 LAB — RENAL FUNCTION PANEL
Albumin: 3.1 g/dL — ABNORMAL LOW (ref 3.5–5.0)
Anion gap: 11 (ref 5–15)
BUN: 14 mg/dL (ref 8–23)
CO2: 21 mmol/L — ABNORMAL LOW (ref 22–32)
Calcium: 8.8 mg/dL — ABNORMAL LOW (ref 8.9–10.3)
Chloride: 98 mmol/L (ref 98–111)
Creatinine, Ser: 1.26 mg/dL — ABNORMAL HIGH (ref 0.61–1.24)
GFR, Estimated: 57 mL/min — ABNORMAL LOW (ref >60)
Glucose, Bld: 188 mg/dL — ABNORMAL HIGH (ref 70–99)
Phosphorus: 1.9 mg/dL — ABNORMAL LOW (ref 2.5–4.6)
Potassium: 3.7 mmol/L (ref 3.5–5.1)
Sodium: 130 mmol/L — ABNORMAL LOW (ref 135–145)

## 2022-08-29 LAB — C-REACTIVE PROTEIN: CRP: 16.8 mg/dL — ABNORMAL HIGH (ref <1.0)

## 2022-08-29 LAB — GLUCOSE, CAPILLARY
Glucose-Capillary: 178 mg/dL — ABNORMAL HIGH (ref 70–99)
Glucose-Capillary: 184 mg/dL — ABNORMAL HIGH (ref 70–99)
Glucose-Capillary: 200 mg/dL — ABNORMAL HIGH (ref 70–99)
Glucose-Capillary: 209 mg/dL — ABNORMAL HIGH (ref 70–99)
Glucose-Capillary: 220 mg/dL — ABNORMAL HIGH (ref 70–99)
Glucose-Capillary: 238 mg/dL — ABNORMAL HIGH (ref 70–99)

## 2022-08-29 LAB — PROCALCITONIN: Procalcitonin: <0.1 ng/mL

## 2022-08-29 LAB — MAGNESIUM: Magnesium: 1.9 mg/dL (ref 1.7–2.4)

## 2022-08-29 LAB — SEDIMENTATION RATE: Sed Rate: 37 mm/hr — ABNORMAL HIGH (ref 0–16)

## 2022-08-29 MED ORDER — ADULT MULTIVITAMIN W/MINERALS CH
1.0000 | ORAL_TABLET | Freq: Every day | ORAL | Status: DC
  Administered 2022-08-30 – 2022-08-31 (×2): 1 via ORAL
  Filled 2022-08-29 (×2): qty 1

## 2022-08-29 MED ORDER — ENSURE ENLIVE PO LIQD
237.0000 mL | Freq: Three times a day (TID) | ORAL | Status: DC
  Administered 2022-08-29 – 2022-08-31 (×4): 237 mL via ORAL

## 2022-08-29 NOTE — Progress Notes (Signed)
PROGRESS NOTE    Christian Morgan  ZOX:[id number] DOB: 01-05-1941 DOA: 08/27/2022 PCP: [name], MD  Outpatient Specialists:     Brief Narrative:  Patient is an 82 year old male with past medical history significant for dementia, Parkinson's disease, unspecified dysphagia, gross hematuria, nephrolithiasis, hyperlipidemia and GERD.  Patient was admitted with COVID-19 infection.  Patient is currently on IV remdesivir.  Elevated inflammatory markers noted today.  Will start patient on dexamethasone 6 Mg p.o. once daily.  Fever has resolved.  Patient is more interactive today, however, still unable to give any history.  Speech evaluation is appreciated.  Continue to monitor blood sugar closely.  Caloric count.  Decrease IV fluids to 30 cc/h.  08/29/2022: Patient seen.  Patient is slowly improving.  Patient remains very sleepy.  Patient is only eating 20% of his meals.  Continue calorie count.  Continue dexamethasone.  Patient has completed remdesivir.   Assessment & Plan:   Principal Problem:   COVID-19 virus infection Active Problems:   COVID-19   COVID-19 viral infection -Continue remdesivir. -Fever is resolving. -Elevated inflammatory markers. -Dexamethasone 6 Mg p.o. once daily. -Supportive care. -Follow inflammatory markers.  Currently not hypoxic Airborne precautions 08/29/2022: Clinically improving.  Patient has completed remdesivir.  Continue dexamethasone.   Acute metabolic encephalopathy suspect in the setting of COVID-19 viral infection -Baseline unknown. -Seems to be improving. -Underlying dementia -UA is nonrevealing, except for likely volume depletion. -Urine cultures negative to date.   -CT head was nonacute but showed the following findings: Chronic left side craniotomy. Progression since 2022 of cerebral white matter changes, most commonly due to chronic small vessel disease. Delirium, fall, and aspiration precautions are in place. 08/29/2022: Slowly  improving.  Patient remains sleepy.   Unspecified dysphagia -Speech evaluation is appreciated. -Query related to Parkinson's disease.  Will review prior records. -Aspiration precautions.   -The patient had a barium swallow study done on 08/14/2022 which showed transient laryngeal penetration without aspiration, mild presbyesophagus.  The patient was unable to swallow the 13 mm barium tablet. 08/29/2022: Speech evaluation is appreciated.   Type 2 diabetes with hyperlipidemia -Continue to monitor CBG. -IV fluid at 30 cc/h. -Continue caloric count. -Continue to monitor and optimize.     CKD 3A No recent prior records to compare Presented with creatinine 1.28 with GFR 56 Start gentle IV fluid hydration Serum creatinine today, 08/28/2022 is 1.23 with GFR of 58. -Renal panel in the morning.   Dementia Parkinson's disease Resume home regimen when able to swallow safely Avoid mind altering medications whenever possible.   Chronic anxiety/depression Resume home regimen when able to swallow safely Avoid mind altering medications as much as possible.   Physical debility Fall at home, no signs of trauma on exam-CT head non acute. PT OT assessment Fall precautions     DVT prophylaxis: Subcutaneous Lovenox. Code Status: Full code. Family Communication:  Disposition Plan: This will depend on hospital course.   Consultants:  None.  Procedures:  None.  Antimicrobials:  None.   Subjective: No significant history from patient.  Objective: Vitals:   08/28/22 1618 08/29/22 0100 08/29/22 0455 08/29/22 0738  BP: 132/73 (!) 150/75 (!) 140/68 (!) 119/54  Pulse: 94 87 75 67  Resp: 16 17 17 16   Temp: 98.3 F (36.8 C) 99.2 F (37.3 C) 98.7 F (37.1 C) 98.2 F (36.8 C)  TempSrc: Oral Oral Oral Oral  SpO2: 97%  93% 100%  Weight:        Intake/Output Summary (Last 24 hours)  at 08/29/2022 1634 Last data filed at 08/29/2022 1500 Gross per 24 hour  Intake 618.89 ml  Output 600  ml  Net 18.89 ml    Filed Weights   08/27/22 0640  Weight: 95.7 kg    Examination:  General exam: Patient remains mildly sleepy versus lethargic, baseline is unknown.  Patient seems better today. Respiratory system: Decreased air entry globally. Cardiovascular system: S1 & S2 heard Gastrointestinal system: Abdomen is obese, soft and nontender.  Central nervous system: Sleepy/mildly lethargic.     Data Reviewed: I have personally reviewed following labs and imaging studies  CBC: Recent Labs  Lab 08/27/22 0336 08/28/22 0822 08/29/22 0128  WBC 11.4* 10.3 11.6*  NEUTROABS 9.3* 8.3* 9.8*  HGB 13.2 12.7* 12.9*  HCT 40.4 38.5* 38.8*  MCV 90.4 89.3 90.9  PLT 281 222 229    Basic Metabolic Panel: Recent Labs  Lab 08/27/22 0336 08/28/22 0822 08/29/22 0128  NA 132* 134* 130*  K 3.8 3.5 3.7  CL 99 99 98  CO2 23 22 21*  GLUCOSE 123* 139* 188*  BUN 16 11 14   CREATININE 1.28* 1.23 1.26*  CALCIUM 9.5 8.9 8.8*  MG  --  1.6* 1.9  PHOS  --  2.7 1.9*    GFR: Estimated Creatinine Clearance: 48.1 mL/min (A) (by C-G formula based on SCr of 1.26 mg/dL (H)). Liver Function Tests: Recent Labs  Lab 08/27/22 0336 08/28/22 0822 08/29/22 0128  AST 21 22  --   ALT 16 15  --   ALKPHOS 55 49  --   BILITOT 0.7 0.8  --   PROT 7.5 6.6  --   ALBUMIN 4.0 3.2* 3.1*    No results for input(s): "LIPASE", "AMYLASE" in the last 168 hours. No results for input(s): "AMMONIA" in the last 168 hours. Coagulation Profile: Recent Labs  Lab 08/27/22 0336  INR 1.1    Cardiac Enzymes: No results for input(s): "CKTOTAL", "CKMB", "CKMBINDEX", "TROPONINI" in the last 168 hours. BNP (last 3 results) No results for input(s): "PROBNP" in the last 8760 hours. HbA1C: Recent Labs    08/27/22 0336  HGBA1C 7.2*    CBG: Recent Labs  Lab 08/28/22 2023 08/29/22 0010 08/29/22 0450 08/29/22 0734 08/29/22 1148  GLUCAP 158* 184* 209* 178* 200*    Lipid Profile: No results for input(s):  "CHOL", "HDL", "LDLCALC", "TRIG", "CHOLHDL", "LDLDIRECT" in the last 72 hours. Thyroid Function Tests: No results for input(s): "TSH", "T4TOTAL", "FREET4", "T3FREE", "THYROIDAB" in the last 72 hours. Anemia Panel: No results for input(s): "VITAMINB12", "FOLATE", "FERRITIN", "TIBC", "IRON", "RETICCTPCT" in the last 72 hours. Urine analysis:    Component Value Date/Time   COLORURINE YELLOW 08/28/2022 0013   APPEARANCEUR CLEAR 08/28/2022 0013   LABSPEC 1.016 08/28/2022 0013   PHURINE 5.0 08/28/2022 0013   GLUCOSEU NEGATIVE 08/28/2022 0013   HGBUR SMALL (A) 08/28/2022 0013   BILIRUBINUR NEGATIVE 08/28/2022 0013   KETONESUR 5 (A) 08/28/2022 0013   PROTEINUR NEGATIVE 08/28/2022 0013   UROBILINOGEN 1.0 03/14/2022 1324   NITRITE NEGATIVE 08/28/2022 0013   LEUKOCYTESUR NEGATIVE 08/28/2022 0013   Sepsis Labs: @LABRCNTIP (procalcitonin:4,lacticidven:4)  ) Recent Results (from the past 240 hour(s))  Blood Culture (routine x 2)     Status: None (Preliminary result)   Collection Time: 08/27/22  3:26 AM   Specimen: BLOOD  Result Value Ref Range Status   Specimen Description BLOOD BLOOD LEFT ARM  Final   Special Requests   Final    BOTTLES DRAWN AEROBIC AND ANAEROBIC Blood Culture adequate  volume   Culture   Final    NO GROWTH 2 DAYS Performed at [facility], [address] [place], [place] [address]    Report Status PENDING  Incomplete  SARS Coronavirus 2 by RT PCR (hospital order, performed in [facility] hospital lab) *cepheid single result test* Anterior Nasal Swab     Status: Abnormal   Collection Time: 08/27/22  3:27 AM   Specimen: Anterior Nasal Swab  Result Value Ref Range Status   SARS Coronavirus 2 by RT PCR POSITIVE (A) NEGATIVE Final    Comment: Performed at [facility], [address], [place], [place] [address]  Blood Culture (routine x 2)     Status: None (Preliminary result)   Collection Time: 08/27/22  3:31 AM   Specimen: BLOOD  Result Value Ref Range  Status   Specimen Description BLOOD BLOOD RIGHT ARM  Final   Special Requests   Final    BOTTLES DRAWN AEROBIC AND ANAEROBIC Blood Culture adequate volume   Culture   Final    NO GROWTH 2 DAYS Performed at [facility], [address], [place], [place] [address]    Report Status PENDING  Incomplete         Radiology Studies: No results found.      Scheduled Meds:  carbidopa-levodopa  1 tablet Oral TID   cyanocobalamin  1,000 mcg Oral Daily   dexamethasone  6 mg Oral Daily   donepezil  10 mg Oral QHS   enoxaparin (LOVENOX) injection  40 mg Subcutaneous Q24H   escitalopram  5 mg Oral Daily   feeding supplement  237 mL Oral TID BM   insulin aspart  0-9 Units Subcutaneous Q4H   losartan  100 mg Oral Daily   [START ON 08/30/2022] multivitamin with minerals  1 tablet Oral Daily   rosuvastatin  5 mg Oral QPM   Continuous Infusions:  dextrose 30 mL/hr at 08/28/22 2323     LOS: 1 day    Time spent: 35 minutes.    Berton Mount, MD  [facility] Pager #: [phone] 7PM-7AM contact night coverage as above

## 2022-08-29 NOTE — Progress Notes (Signed)
Initial Nutrition Assessment  DOCUMENTATION CODES:   Obesity unspecified  INTERVENTION:  Start 48 hour calorie count per order. Updated calorie count order with instructions. Discussed with RN via secure chat. RD will follow-up on calorie count results after the weekend.  Recommend replacing phosphorus of 1.9 this AM. Reached out to MD via secure chat.  Monitor magnesium, potassium, and phosphorus daily for at least 3 days, MD to replete as needed, as pt may be at risk for refeeding syndrome.   Provide Ensure Enlive po TID, each supplement provides 350 kcal and 20 grams of protein. Please thicken to appropriate consistency per SLP recommendations.  Provide Magic cup po BID with lunch and dinner, each supplement provides 290 kcal and 9 grams of protein.   Provide multivitamin with minerals po daily.  NUTRITION DIAGNOSIS:   Increased nutrient needs related to catabolic illness (COVID-19) as evidenced by estimated needs.  GOAL:   Patient will meet greater than or equal to 90% of their needs  MONITOR:   PO intake, Supplement acceptance, Labs, Weight trends, I & O's  REASON FOR ASSESSMENT:   Other (Comment) (New calorie count)    ASSESSMENT:   82 year old male who is hard of hearing with PMHx of dementia, Parkinson's disease, HTN, HLD, DM, unspecified dysphagia, gross hematuria, nephrolithiasis, GERD admitted with COVID-19 and acute metabolic encephalopathy.  6/28: diet advanced to dysphagia 2 with nectar thick liquids following SLP evaluation  RD working remotely. Per review of chart patient has been unable to give any history. Attempted to call patient over the phone, but he was unable to answer. Per review of chart pt was initially made NPO upon admission and diet was just advanced yesterday to dysphagia 2 with nectar-thick liquids following SLP evaluation. Only documented intake yesterday was applesauce at 2323. Pt currently ordered for Ensure and it was marked as given  yesterday. Pt with increased nutrient needs in setting of catabolic illness. Will update Ensure order to po TID between meals (please thicken to appropriate consistency per SLP recommendations) and also send Magic Cup BID on trays. Noted phosphorus low today at 1.9. Recommend replacing phosphorus today. Pt may be at risk for refeeding syndrome, so recommend monitoring potassium, phosphorus, and magnesium daily x 3 days, MD to replace as needed.  Per review of chart pt was 102 kg on 04/04/22. Current wt is 95.7 kg (210.98 lbs). Pt has lost 6.3 kg or 6.2% weight over the past almost 5 months, which is not significant for time frame.  Medications reviewed and include: vitamin B12 1000 micrograms daily, Decadron 6 mg daily, Ensure Enlive po BID, Novolog 0-9 units Q4hrs, D5 at 30 mL/hour, remdesivir  Labs reviewed: CBG 178-209, Sodium 130, CO2 21, Creatinine 1.26, Phosphorus 1.9  UOP: 600 mL (0.3 mL/kg/hr) in previous 24 hours  I/O: -1181.8 mL since admission  Sent secure chat to RN regarding plan for calorie count. Sent secure chat to MD regarding phosphorus level this AM and possible risk for refeeding syndrome.  NUTRITION - FOCUSED PHYSICAL EXAM:  Unable to complete as RD is working remotely  Diet Order:   Diet Order             DIET DYS 2 Room service appropriate? No; Fluid consistency: Nectar Thick  Diet effective now                  EDUCATION NEEDS:   No education needs have been identified at this time  Skin:  Skin Assessment: Reviewed RN Assessment  Last BM:  Unknown  Height:   Ht Readings from Last 1 Encounters:  04/04/22 5\' 6"  (1.676 m)   Weight:   Wt Readings from Last 1 Encounters:  08/27/22 95.7 kg   Ideal Body Weight:  64.5 kg  BMI:  Body mass index is 34.05 kg/m.  Estimated Nutritional Needs:   Kcal:  2000-2200  Protein:  100-110 grams  Fluid:  2-2.2 L/day  Letta Median, MS, RD, LDN, CNSC Pager number available on Amion

## 2022-08-30 DIAGNOSIS — U071 COVID-19: Secondary | ICD-10-CM | POA: Diagnosis not present

## 2022-08-30 LAB — CBC WITH DIFFERENTIAL/PLATELET
Abs Immature Granulocytes: 0.07 K/uL (ref 0.00–0.07)
Basophils Absolute: 0 K/uL (ref 0.0–0.1)
Basophils Relative: 0 %
Eosinophils Absolute: 0 K/uL (ref 0.0–0.5)
Eosinophils Relative: 0 %
HCT: 37.8 % — ABNORMAL LOW (ref 39.0–52.0)
Hemoglobin: 12.8 g/dL — ABNORMAL LOW (ref 13.0–17.0)
Immature Granulocytes: 1 %
Lymphocytes Relative: 10 %
Lymphs Abs: 1.3 K/uL (ref 0.7–4.0)
MCH: 30.5 pg (ref 26.0–34.0)
MCHC: 33.9 g/dL (ref 30.0–36.0)
MCV: 90 fL (ref 80.0–100.0)
Monocytes Absolute: 1.1 K/uL — ABNORMAL HIGH (ref 0.1–1.0)
Monocytes Relative: 8 %
Neutro Abs: 11.3 K/uL — ABNORMAL HIGH (ref 1.7–7.7)
Neutrophils Relative %: 81 %
Platelets: 269 K/uL (ref 150–400)
RBC: 4.2 MIL/uL — ABNORMAL LOW (ref 4.22–5.81)
RDW: 14.2 % (ref 11.5–15.5)
WBC: 13.7 K/uL — ABNORMAL HIGH (ref 4.0–10.5)
nRBC: 0 % (ref 0.0–0.2)

## 2022-08-30 LAB — GLUCOSE, CAPILLARY
Glucose-Capillary: 201 mg/dL — ABNORMAL HIGH (ref 70–99)
Glucose-Capillary: 203 mg/dL — ABNORMAL HIGH (ref 70–99)
Glucose-Capillary: 226 mg/dL — ABNORMAL HIGH (ref 70–99)
Glucose-Capillary: 248 mg/dL — ABNORMAL HIGH (ref 70–99)
Glucose-Capillary: 276 mg/dL — ABNORMAL HIGH (ref 70–99)
Glucose-Capillary: 348 mg/dL — ABNORMAL HIGH (ref 70–99)
Glucose-Capillary: 428 mg/dL — ABNORMAL HIGH (ref 70–99)

## 2022-08-30 LAB — URINE CULTURE: Culture: <10000 — AB

## 2022-08-30 LAB — RENAL FUNCTION PANEL
Albumin: 2.9 g/dL — ABNORMAL LOW (ref 3.5–5.0)
Anion gap: 10 (ref 5–15)
BUN: 40 mg/dL — ABNORMAL HIGH (ref 8–23)
CO2: 22 mmol/L (ref 22–32)
Calcium: 9.2 mg/dL (ref 8.9–10.3)
Chloride: 99 mmol/L (ref 98–111)
Creatinine, Ser: 1.43 mg/dL — ABNORMAL HIGH (ref 0.61–1.24)
GFR, Estimated: 49 mL/min — ABNORMAL LOW
Glucose, Bld: 230 mg/dL — ABNORMAL HIGH (ref 70–99)
Phosphorus: 2.6 mg/dL (ref 2.5–4.6)
Potassium: 4 mmol/L (ref 3.5–5.1)
Sodium: 131 mmol/L — ABNORMAL LOW (ref 135–145)

## 2022-08-30 LAB — MAGNESIUM: Magnesium: 2.2 mg/dL (ref 1.7–2.4)

## 2022-08-30 LAB — PHOSPHORUS: Phosphorus: 2.4 mg/dL — ABNORMAL LOW (ref 2.5–4.6)

## 2022-08-30 NOTE — Progress Notes (Signed)
PROGRESS NOTE    Christian Morgan  BJY:[id number] DOB: 1940-06-19 DOA: 08/27/2022 PCP: [name], MD  Outpatient Specialists:     Brief Narrative:  Patient is an 82 year old male with past medical history significant for dementia, Parkinson's disease, unspecified dysphagia, gross hematuria, nephrolithiasis, hyperlipidemia and GERD.  Patient was admitted with COVID-19 infection.  Patient is currently on IV remdesivir.  Elevated inflammatory markers noted today.  Will start patient on dexamethasone 6 Mg p.o. once daily.  Fever has resolved.  Patient is more interactive today, however, still unable to give any history.  Speech evaluation is appreciated.  Continue to monitor blood sugar closely.  Caloric count.  Decrease IV fluids to 30 cc/h.  08/29/2022: Patient seen.  Patient is slowly improving.  Patient remains very sleepy.  Patient is only eating 20% of his meals.  Continue calorie count.  Continue dexamethasone.  Patient has completed remdesivir.  08/30/2022: Patient continues to improve.  Likely discharge tomorrow.  TOC to assist with discharge.  Patient's son updated.   Assessment & Plan:   Principal Problem:   COVID-19 virus infection Active Problems:   COVID-19   COVID-19 viral infection -Continue remdesivir. -Fever is resolving. -Elevated inflammatory markers. -Dexamethasone 6 Mg p.o. once daily. -Supportive care. -Follow inflammatory markers.  Currently not hypoxic Airborne precautions 08/29/2022: Clinically improving.  Patient has completed remdesivir.  Continue dexamethasone. 08/30/2022: Patient has improved significantly.  Likely discharge tomorrow.   Acute metabolic encephalopathy suspect in the setting of COVID-19 viral infection -Baseline unknown. -Seems to be improving. -Underlying dementia -UA is nonrevealing, except for likely volume depletion. -Urine cultures negative to date.   -CT head was nonacute but showed the following findings: Chronic left side  craniotomy. Progression since 2022 of cerebral white matter changes, most commonly due to chronic small vessel disease. Delirium, fall, and aspiration precautions are in place. 08/29/2022: Slowly improving.  Patient remains sleepy. 08/30/2022: Resolved significantly.   Unspecified dysphagia -Speech evaluation is appreciated. -Query related to Parkinson's disease.  Will review prior records. -Aspiration precautions.   -The patient had a barium swallow study done on 08/14/2022 which showed transient laryngeal penetration without aspiration, mild presbyesophagus.  The patient was unable to swallow the 13 mm barium tablet. 08/29/2022: Speech evaluation is appreciated.   Type 2 diabetes with hyperlipidemia -Continue to monitor CBG. -IV fluid at 30 cc/h. -Continue caloric count. -Continue to monitor and optimize.     CKD 3A No recent prior records to compare Presented with creatinine 1.28 with GFR 56 Start gentle IV fluid hydration Serum creatinine today, 08/28/2022 is 1.23 with GFR of 58. -Renal panel in the morning. 08/30/2022: Serum creatinine of 1.43.  Continue to monitor closely.   Dementia Parkinson's disease Resume home regimen when able to swallow safely Avoid mind altering medications whenever possible.   Chronic anxiety/depression Resume home regimen when able to swallow safely Avoid mind altering medications as much as possible.   Physical debility Fall at home, no signs of trauma on exam-CT head non acute. PT OT assessment Fall precautions     DVT prophylaxis: Subcutaneous Lovenox. Code Status: Full code. Family Communication:  Disposition Plan: This will depend on hospital course.   Consultants:  None.  Procedures:  None.  Antimicrobials:  None.   Subjective: No new complaints. Patient is very communicative today.    Objective: Vitals:   08/29/22 2127 08/30/22 0025 08/30/22 0546 08/30/22 0750  BP: 119/65 (!) 107/57 107/79 130/63  Pulse: 64 (!) 57 61  74  Resp: 18  19 16 18   Temp: 98.2 F (36.8 C) 98.4 F (36.9 C) 98.7 F (37.1 C) (!) 96.9 F (36.1 C)  TempSrc: Oral Oral Axillary   SpO2: 91% 95% 96% (!) 89%  Weight:        Intake/Output Summary (Last 24 hours) at 08/30/2022 1317 Last data filed at 08/30/2022 0313 Gross per 24 hour  Intake 922.99 ml  Output --  Net 922.99 ml    Filed Weights   08/27/22 0640  Weight: 95.7 kg    Examination:  General exam: Patient is awake and alert.  Patient is very communicative today.  Not in any distress.   Respiratory system: Improved air entry.   Cardiovascular system: S1 & S2 heard Gastrointestinal system: Abdomen is obese, soft and nontender.  Central nervous system: Awake and alert.   Data Reviewed: I have personally reviewed following labs and imaging studies  CBC: Recent Labs  Lab 08/27/22 0336 08/28/22 0822 08/29/22 0128 08/30/22 1150  WBC 11.4* 10.3 11.6* 13.7*  NEUTROABS 9.3* 8.3* 9.8* 11.3*  HGB 13.2 12.7* 12.9* 12.8*  HCT 40.4 38.5* 38.8* 37.8*  MCV 90.4 89.3 90.9 90.0  PLT 281 222 229 269    Basic Metabolic Panel: Recent Labs  Lab 08/27/22 0336 08/28/22 0822 08/29/22 0128 08/30/22 0105 08/30/22 1150  NA 132* 134* 130*  --  131*  K 3.8 3.5 3.7  --  4.0  CL 99 99 98  --  99  CO2 23 22 21*  --  22  GLUCOSE 123* 139* 188*  --  230*  BUN 16 11 14   --  40*  CREATININE 1.28* 1.23 1.26*  --  1.43*  CALCIUM 9.5 8.9 8.8*  --  9.2  MG  --  1.6* 1.9 2.2  --   PHOS  --  2.7 1.9* 2.4* 2.6    GFR: Estimated Creatinine Clearance: 42.4 mL/min (A) (by C-G formula based on SCr of 1.43 mg/dL (H)). Liver Function Tests: Recent Labs  Lab 08/27/22 0336 08/28/22 0822 08/29/22 0128 08/30/22 1150  AST 21 22  --   --   ALT 16 15  --   --   ALKPHOS 55 49  --   --   BILITOT 0.7 0.8  --   --   PROT 7.5 6.6  --   --   ALBUMIN 4.0 3.2* 3.1* 2.9*    No results for input(s): "LIPASE", "AMYLASE" in the last 168 hours. No results for input(s): "AMMONIA" in the last  168 hours. Coagulation Profile: Recent Labs  Lab 08/27/22 0336  INR 1.1    Cardiac Enzymes: No results for input(s): "CKTOTAL", "CKMB", "CKMBINDEX", "TROPONINI" in the last 168 hours. BNP (last 3 results) No results for input(s): "PROBNP" in the last 8760 hours. HbA1C: No results for input(s): "HGBA1C" in the last 72 hours.  CBG: Recent Labs  Lab 08/29/22 2015 08/30/22 0018 08/30/22 0526 08/30/22 0747 08/30/22 1116  GLUCAP 238* 226* 201* 203* 248*    Lipid Profile: No results for input(s): "CHOL", "HDL", "LDLCALC", "TRIG", "CHOLHDL", "LDLDIRECT" in the last 72 hours. Thyroid Function Tests: No results for input(s): "TSH", "T4TOTAL", "FREET4", "T3FREE", "THYROIDAB" in the last 72 hours. Anemia Panel: No results for input(s): "VITAMINB12", "FOLATE", "FERRITIN", "TIBC", "IRON", "RETICCTPCT" in the last 72 hours. Urine analysis:    Component Value Date/Time   COLORURINE YELLOW 08/28/2022 0013   APPEARANCEUR CLEAR 08/28/2022 0013   LABSPEC 1.016 08/28/2022 0013   PHURINE 5.0 08/28/2022 0013   GLUCOSEU NEGATIVE 08/28/2022 0013  HGBUR SMALL (A) 08/28/2022 0013   BILIRUBINUR NEGATIVE 08/28/2022 0013   KETONESUR 5 (A) 08/28/2022 0013   PROTEINUR NEGATIVE 08/28/2022 0013   UROBILINOGEN 1.0 03/14/2022 1324   NITRITE NEGATIVE 08/28/2022 0013   LEUKOCYTESUR NEGATIVE 08/28/2022 0013   Sepsis Labs: @LABRCNTIP (procalcitonin:4,lacticidven:4)  ) Recent Results (from the past 240 hour(s))  Blood Culture (routine x 2)     Status: None (Preliminary result)   Collection Time: 08/27/22  3:26 AM   Specimen: BLOOD  Result Value Ref Range Status   Specimen Description BLOOD BLOOD LEFT ARM  Final   Special Requests   Final    BOTTLES DRAWN AEROBIC AND ANAEROBIC Blood Culture adequate volume   Culture   Final    NO GROWTH 3 DAYS Performed at [facility], [address] [place], [place] [address]    Report Status PENDING  Incomplete  SARS Coronavirus 2 by RT PCR (hospital  order, performed in [facility] hospital lab) *cepheid single result test* Anterior Nasal Swab     Status: Abnormal   Collection Time: 08/27/22  3:27 AM   Specimen: Anterior Nasal Swab  Result Value Ref Range Status   SARS Coronavirus 2 by RT PCR POSITIVE (A) NEGATIVE Final    Comment: Performed at [facility], [address], [place], [place] [address]  Blood Culture (routine x 2)     Status: None (Preliminary result)   Collection Time: 08/27/22  3:31 AM   Specimen: BLOOD  Result Value Ref Range Status   Specimen Description BLOOD BLOOD RIGHT ARM  Final   Special Requests   Final    BOTTLES DRAWN AEROBIC AND ANAEROBIC Blood Culture adequate volume   Culture   Final    NO GROWTH 3 DAYS Performed at [facility], [address], [place], [place] [address]    Report Status PENDING  Incomplete  Urine Culture (for pregnant, neutropenic or urologic patients or patients with an indwelling urinary catheter)     Status: Abnormal   Collection Time: 08/28/22 12:19 AM   Specimen: Urine, Clean Catch  Result Value Ref Range Status   Specimen Description URINE, CLEAN CATCH  Final   Special Requests NONE  Final   Culture (A)  Final    <10,000 COLONIES/mL INSIGNIFICANT GROWTH Performed at [facility], [address] [place], [place] [address]    Report Status 08/30/2022 FINAL  Final         Radiology Studies: No results found.      Scheduled Meds:  carbidopa-levodopa  1 tablet Oral TID   cyanocobalamin  1,000 mcg Oral Daily   dexamethasone  6 mg Oral Daily   donepezil  10 mg Oral QHS   enoxaparin (LOVENOX) injection  40 mg Subcutaneous Q24H   escitalopram  5 mg Oral Daily   feeding supplement  237 mL Oral TID BM   insulin aspart  0-9 Units Subcutaneous Q4H   losartan  100 mg Oral Daily   multivitamin with minerals  1 tablet Oral Daily   rosuvastatin  5 mg Oral QPM   Continuous Infusions:     LOS: 2 days    Time spent: 35 minutes.    Berton Mount, MD  [facility] Pager #: [phone] 7PM-7AM contact night coverage as above

## 2022-08-30 NOTE — TOC Initial Note (Signed)
Transition of Care [facility]) - Initial/Assessment Note    Patient Details  Name: Christian Morgan MRN: [id number] Date of Birth: November 23, 1940  Transition of Care [facility]) CM/SW Contact:    Baldemar Lenis, LCSW Phone Number: 08/30/2022, 10:44 AM  Clinical Narrative:       CSW spoke with patient's son, Dmarion, about recommendation for SNF. Undray discussed that patient has been receiving outpatient therapy at neurorehab, but CSW explained difference between outpatient and SNF. Graceson tentatively in agreement, wants to see what the options are and follow patient's treatment course before fully deciding as he is not a fan of nursing homes. Azon is caring for the patient's spouse at home and so far has not tested positive for covid himself. Lengthy discussion with Zachrey to answer questions related to patient and SNF recommendation. Mathhew would like to speak with MD about patient's treatment course, CSW sent MD a message. CSW faxed out referral, will follow up with bed offers.             Expected Discharge Plan: Skilled Nursing Facility Barriers to Discharge: Continued Medical Work up, SNF Covid, SNF Pending bed offer, Insurance Authorization   Patient Goals and CMS Choice Patient states their goals for this hospitalization and ongoing recovery are:: patient unable to participate in goal setting, not oriented CMS Medicare.gov Compare Post Acute Care list provided to:: Patient Represenative (must comment) Choice offered to / list presented to : Adult Children [place] ownership interest in [facility].provided to:: Adult Children    Expected Discharge Plan and Services     Post Acute Care Choice: Skilled Nursing Facility Living arrangements for the past 2 months: Single Family Home                                      Prior Living Arrangements/Services Living arrangements for the past 2 months: Single Family Home Lives with:: Spouse Patient language and need for interpreter  reviewed:: No Do you feel safe going back to the place where you live?: Yes      Need for Family Participation in Patient Care: Yes (Comment) Care giver support system in place?: Yes (comment)   Criminal Activity/Legal Involvement Pertinent to Current Situation/Hospitalization: No - Comment as needed  Activities of Daily Living      Permission Sought/Granted Permission sought to share information with : Facility Medical sales representative, Family Supports Permission granted to share information with : Yes, Verbal Permission Granted  Share Information with NAME: Cergio  Permission granted to share info w AGENCY: SNF  Permission granted to share info w Relationship: Son     Emotional Assessment   Attitude/Demeanor/Rapport: Unable to Assess Affect (typically observed): Unable to Assess Orientation: : Oriented to Self Alcohol / Substance Use: Not Applicable Psych Involvement: No (comment)  Admission diagnosis:  Disorientation [R41.0] COVID-19 virus infection [U07.1] COVID-19 [U07.1] Patient Active Problem List   Diagnosis Date Noted   COVID-19 08/28/2022   COVID-19 virus infection 08/27/2022   Altered mental status 05/21/2021   Anxiety 08/07/2020   Aortic valve disorder 08/07/2020   Atopic dermatitis 08/07/2020   Benign prostatic hyperplasia 08/07/2020   Constipation 08/07/2020   Dementia (HCC) 08/07/2020   ED (erectile dysfunction) of organic origin 08/07/2020   Moderate recurrent major depression (HCC) 08/07/2020   Morbid obesity (HCC) 08/07/2020   Nephrolithiasis 08/07/2020   Memory loss 08/07/2020   Overweight 08/07/2020   Parkinson's disease  08/07/2020   Insomnia 08/07/2020   Postherpetic neuralgia 08/07/2020   Pure hypercholesterolemia 08/07/2020   Recurrent major depression in remission (HCC) 08/07/2020   Pain due to onychomycosis of toenails of both feet 01/03/2020   Diabetes mellitus without complication (HCC) 01/03/2020   Major neurocognitive disorder, unclear  etiology 08/02/2019   Hyperglycemia due to diabetes mellitus 09/20/2017   Daytime somnolence 01/05/2017   Type I diabetes mellitus (HCC) 10/31/2009   Hyperlipidemia 10/31/2009   Obstructive sleep apnea 10/31/2009   Essential hypertension 10/31/2009   Chronic rhinitis 10/31/2009   PCP:  [name], MD Pharmacy:   [facility] [phone] [place], [place] - [address] [place] [address] Phone: [phone] Fax: [phone]     Social Determinants of Health (SDOH) Social History: SDOH Screenings   Depression (PHQ2-9): Low Risk  (07/27/2019)  Tobacco Use: Medium Risk (08/27/2022)   SDOH Interventions:     Readmission Risk Interventions     No data to display

## 2022-08-30 NOTE — NC FL2 (Signed)
[place] MEDICAID FL2 LEVEL OF CARE FORM     IDENTIFICATION  Patient Name: Christian Morgan Birthdate: 01-Jul-1940 Sex: male Admission Date (Current Location): 08/27/2022  [facility] and [place] Number:  Producer, television/film/video and Address:  [facility], [address] [place], [place] [address]      Provider Number: [id number]  Attending Physician Name and Address:  Barnetta Chapel, MD  Relative Name and Phone Number:       Current Level of Care: Hospital Recommended Level of Care: Skilled Nursing Facility Prior Approval Number:    Date Approved/Denied:   PASRR Number: [id number]  Discharge Plan: SNF    Current Diagnoses: Patient Active Problem List   Diagnosis Date Noted   COVID-19 08/28/2022   COVID-19 virus infection 08/27/2022   Altered mental status 05/21/2021   Anxiety 08/07/2020   Aortic valve disorder 08/07/2020   Atopic dermatitis 08/07/2020   Benign prostatic hyperplasia 08/07/2020   Constipation 08/07/2020   Dementia (HCC) 08/07/2020   ED (erectile dysfunction) of organic origin 08/07/2020   Moderate recurrent major depression (HCC) 08/07/2020   Morbid obesity (HCC) 08/07/2020   Nephrolithiasis 08/07/2020   Memory loss 08/07/2020   Overweight 08/07/2020   Parkinson's disease 08/07/2020   Insomnia 08/07/2020   Postherpetic neuralgia 08/07/2020   Pure hypercholesterolemia 08/07/2020   Recurrent major depression in remission (HCC) 08/07/2020   Pain due to onychomycosis of toenails of both feet 01/03/2020   Diabetes mellitus without complication (HCC) 01/03/2020   Major neurocognitive disorder, unclear etiology 08/02/2019   Hyperglycemia due to diabetes mellitus 09/20/2017   Daytime somnolence 01/05/2017   Type I diabetes mellitus (HCC) 10/31/2009   Hyperlipidemia 10/31/2009   Obstructive sleep apnea 10/31/2009   Essential hypertension 10/31/2009   Chronic rhinitis 10/31/2009    Orientation RESPIRATION BLADDER Height  & Weight     Self  Normal Incontinent Weight: 210 lb 15.7 oz (95.7 kg) Height:     BEHAVIORAL SYMPTOMS/MOOD NEUROLOGICAL BOWEL NUTRITION STATUS      Incontinent Diet (see DC summary)  AMBULATORY STATUS COMMUNICATION OF NEEDS Skin   Extensive Assist Verbally Normal                       Personal Care Assistance Level of Assistance  Bathing, Feeding, Dressing Bathing Assistance: Maximum assistance Feeding assistance: Limited assistance Dressing Assistance: Maximum assistance     Functional Limitations Info  Speech     Speech Info: Impaired (delayed responses)    SPECIAL CARE FACTORS FREQUENCY  PT (By licensed PT), OT (By licensed OT), Speech therapy     PT Frequency: 5x/wk OT Frequency: 5x/wk     Speech Therapy Frequency: 5x/wk      Contractures Contractures Info: Not present    Additional Factors Info  Code Status, Allergies, Psychotropic, Insulin Sliding Scale Code Status Info: Full Allergies Info: Lipitor (Atorvastatin), Penicillins, Amitriptyline Hcl, Cyclobenzaprine, Gabapentin, Other, Zetia (Ezetimibe), Aspirin, Doxycycline, Iodinated Contrast Media, Levofloxacin Psychotropic Info: Lexapro 5mg  daily Insulin Sliding Scale Info: see DC summary       Current Medications (08/30/2022):  This is the current hospital active medication list Current Facility-Administered Medications  Medication Dose Route Frequency Provider Last Rate Last Admin   acetaminophen (TYLENOL) suppository 650 mg  650 mg Rectal Q4H PRN Anthoney Harada, NP   650 mg at 08/28/22 0425   acetaminophen (TYLENOL) tablet 500 mg  500 mg Oral Q6H PRN Barnetta Chapel, MD  carbidopa-levodopa (SINEMET IR) 25-100 MG per tablet immediate release 1 tablet  1 tablet Oral TID Barnetta Chapel, MD   1 tablet at 08/29/22 2223   cyanocobalamin (VITAMIN B12) tablet 1,000 mcg  1,000 mcg Oral Daily Berton Mount I, MD   1,000 mcg at 08/29/22 1128   dexamethasone (DECADRON) tablet 6 mg  6 mg Oral  Daily Berton Mount I, MD   6 mg at 08/29/22 1128   dextrose 5 % solution   Intravenous Continuous Berton Mount I, MD 30 mL/hr at 08/30/22 0313 Infusion Verify at 08/30/22 0313   donepezil (ARICEPT) tablet 10 mg  10 mg Oral QHS Berton Mount I, MD   10 mg at 08/29/22 2223   enoxaparin (LOVENOX) injection 40 mg  40 mg Subcutaneous Q24H Dow Adolph N, DO   40 mg at 08/29/22 1127   escitalopram (LEXAPRO) tablet 5 mg  5 mg Oral Daily Berton Mount I, MD   5 mg at 08/29/22 1128   feeding supplement (ENSURE ENLIVE / ENSURE PLUS) liquid 237 mL  237 mL Oral TID BM Berton Mount I, MD   237 mL at 08/29/22 2025   hydrALAZINE (APRESOLINE) injection 5 mg  5 mg Intravenous Q8H PRN Dow Adolph N, DO       insulin aspart (novoLOG) injection 0-9 Units  0-9 Units Subcutaneous Q4H Dow Adolph N, DO   2 Units at 08/30/22 0559   Ipratropium-Albuterol (COMBIVENT) respimat 1 puff  1 puff Inhalation Q6H PRN Darlin Drop, DO       loratadine (CLARITIN) tablet 10 mg  10 mg Oral Daily PRN Berton Mount I, MD       losartan (COZAAR) tablet 100 mg  100 mg Oral Daily Berton Mount I, MD   100 mg at 08/29/22 1128   multivitamin with minerals tablet 1 tablet  1 tablet Oral Daily Berton Mount I, MD       prochlorperazine (COMPAZINE) injection 5 mg  5 mg Intravenous Q6H PRN Dow Adolph N, DO       rosuvastatin (CRESTOR) tablet 5 mg  5 mg Oral QPM Berton Mount I, MD   5 mg at 08/29/22 1711     Discharge Medications: Please see discharge summary for a list of discharge medications.  Relevant Imaging Results:  Relevant Lab Results:   Additional Information SS#: [id number]  Baldemar Lenis, LCSW

## 2022-08-31 LAB — URINALYSIS, ROUTINE W REFLEX MICROSCOPIC
Bacteria, UA: NONE SEEN
Bilirubin Urine: NEGATIVE
Glucose, UA: >=500 mg/dL — AB
Hgb urine dipstick: NEGATIVE
Ketones, ur: NEGATIVE mg/dL
Leukocytes,Ua: NEGATIVE
Nitrite: NEGATIVE
Protein, ur: NEGATIVE mg/dL
Specific Gravity, Urine: 1.023 (ref 1.005–1.030)
pH: 5 (ref 5.0–8.0)

## 2022-08-31 LAB — SODIUM, URINE, RANDOM: Sodium, Ur: 21 mmol/L

## 2022-08-31 LAB — GLUCOSE, CAPILLARY
Glucose-Capillary: 177 mg/dL — ABNORMAL HIGH (ref 70–99)
Glucose-Capillary: 189 mg/dL — ABNORMAL HIGH (ref 70–99)
Glucose-Capillary: 200 mg/dL — ABNORMAL HIGH (ref 70–99)
Glucose-Capillary: 396 mg/dL — ABNORMAL HIGH (ref 70–99)

## 2022-08-31 LAB — PHOSPHORUS: Phosphorus: 2.5 mg/dL (ref 2.5–4.6)

## 2022-08-31 LAB — MAGNESIUM: Magnesium: 2.2 mg/dL (ref 1.7–2.4)

## 2022-08-31 MED ORDER — ADULT MULTIVITAMIN W/MINERALS CH: 1.0000 | ORAL_TABLET | Freq: Every day | ORAL | 1 refills | Status: AC

## 2022-08-31 MED ORDER — IPRATROPIUM-ALBUTEROL 20-100 MCG/ACT IN AERS: 1.0000 | INHALATION_SPRAY | Freq: Four times a day (QID) | RESPIRATORY_TRACT | 1 refills | Status: DC | PRN

## 2022-08-31 MED ORDER — ENSURE ENLIVE PO LIQD: 237.0000 mL | Freq: Two times a day (BID) | ORAL | Status: DC

## 2022-08-31 MED ORDER — ENSURE ENLIVE PO LIQD: 237.0000 mL | Freq: Three times a day (TID) | ORAL | 12 refills | Status: DC

## 2022-08-31 NOTE — Progress Notes (Signed)
Speech Language Pathology Treatment: Dysphagia  Patient Details Name: Cyree Hilinski MRN: [id number] DOB: 26-Sep-1940 Today's Date: 08/31/2022 Time: 4098-1191 SLP Time Calculation (min) (ACUTE ONLY): 8 min  Assessment / Plan / Recommendation Clinical Impression  Pt has improved since initial assessment. There is no audible pharyngeal congestion, pt is smiling and interacting with therapist. Cup and straw sips thin liquid consumed without s/s aspiration and appeared to coordinate swallow with respiration over multiple trials. Mastication of solid texture was more efficient and purposeful today without residue. SLP upgraded to regular texture, thin liquids, pills whole in puree. Pt has a sitter present. No further ST needed at this time. Ensure hi is sitting upright with po's.   HPI HPI: 82 y.o. male admitted 6/27 from home due to altered mental status x 1 day and after rolling out of bed at home overnight, found to be COVID +.Past medical history significant for dementia, Parkinson's disease, unspecified dysphagia, history of gross hematuria and nephrolithiasis, chronic anxiety/depression, hyperlipidemia, GERD.      SLP Plan  All goals met;Discharge SLP treatment due to (comment)      Recommendations for follow up therapy are one component of a multi-disciplinary discharge planning process, led by the attending physician.  Recommendations may be updated based on patient status, additional functional criteria and insurance authorization.    Recommendations  Diet recommendations: Regular;Thin liquid Liquids provided via: Cup;Straw Medication Administration: Whole meds with puree Supervision: Patient able to self feed;Staff to assist with self feeding;Intermittent supervision to cue for compensatory strategies Compensations: Slow rate;Small sips/bites;Lingual sweep for clearance of pocketing Postural Changes and/or Swallow Maneuvers: Seated upright 90 degrees                  Oral  care BID   Frequent or constant Supervision/Assistance Dysphagia, unspecified (R13.10)     All goals met;Discharge SLP treatment due to (comment)     Royce Macadamia  08/31/2022, 9:53 AM

## 2022-08-31 NOTE — Plan of Care (Signed)
  Problem: Education: Goal: Knowledge of risk factors and measures for prevention of condition will improve Outcome: Progressing   Problem: Coping: Goal: Psychosocial and spiritual needs will be supported Outcome: Progressing   Problem: Respiratory: Goal: Will maintain a patent airway Outcome: Progressing Goal: Complications related to the disease process, condition or treatment will be avoided or minimized Outcome: Progressing   Problem: Clinical Measurements: Goal: Will remain free from infection Outcome: Progressing Goal: Diagnostic test results will improve Outcome: Progressing   Problem: Activity: Goal: Risk for activity intolerance will decrease Outcome: Progressing   Problem: Nutrition: Goal: Adequate nutrition will be maintained Outcome: Progressing   Problem: Elimination: Goal: Will not experience complications related to bowel motility Outcome: Progressing Goal: Will not experience complications related to urinary retention Outcome: Progressing   Problem: Safety: Goal: Ability to remain free from injury will improve Outcome: Progressing   Problem: Skin Integrity: Goal: Risk for impaired skin integrity will decrease Outcome: Progressing

## 2022-08-31 NOTE — Progress Notes (Signed)
Nutrition Follow-up  DOCUMENTATION CODES:   Obesity unspecified  INTERVENTION:  Unable to calculate results of 48 hour calorie count as envelope with meal ticket receipts had been discarded. Discussed with MD.  Will decrease to Ensure Enlive po BID, each supplement provides 350 kcal and 20 grams of protein.  Continue Magic cup po BID with lunch and dinner, each supplement provides 290 kcal and 9 grams of protein.   Continue multivitamin with minerals po daily.  Provided "High Protein Food List" from the Academy of [employer] Dietetics and reviewed with patient's son. Encouraged adequate intake of protein at meals and snacks and reviewed foods that patient would enjoy that are good sources of protein.  NUTRITION DIAGNOSIS:   Increased nutrient needs related to catabolic illness (COVID-19) as evidenced by estimated needs.  Ongoing.  GOAL:   Patient will meet greater than or equal to 90% of their needs  Progressing with interventions.  MONITOR:   PO intake, Supplement acceptance, Labs, Weight trends, I & O's  REASON FOR ASSESSMENT:   Other (Comment) (New calorie count)    ASSESSMENT:   82 year old male who is hard of hearing with PMHx of dementia, Parkinson's disease, HTN, HLD, DM, unspecified dysphagia, gross hematuria, nephrolithiasis, GERD admitted with COVID-19 and acute metabolic encephalopathy.  6/28: diet advanced to dysphagia 2 with nectar thick liquids following SLP evaluation  7/1: diet advanced to regular with thin liquids following SLP evaluation  Met with patient and son at bedside. History provided by son. He reports patient has a good appetite and intake at baseline. He reports patient typically eats 3 meals per day. For breakfast he has eggs with sausage or breakfast burrito. For lunch/dinner may have meat/protein with sides. Son reports pt had a few days of decreased PO intake but that he is eating well now. No calorie count receipts available in room at  time of RD assessment. Son reports envelope was previously hanging on the door. Apparently envelope was discarded in the trash. Son reports patient is eating well and ate 100% of meals yesterday. No meal documentation available from yesterday. Pt documented to have eaten 100% of breakfast and 75% of lunch today. Son reports pt is tolerating Ensure and drinking these supplements. Likely can decrease to BID with improved PO intake. Son reports pt limits lactose due to possible lactose intolerance. He also reports a [place] allergy, but reports pt has migraines with intake of [place]. Denies nausea, emesis, or abdominal pain. Reports typically no difficulty with chewing/swallowing at baseline except for difficulty with larger pills. Diet was advanced to regular texture with thin liquids per SLP recommendation today. Plan is now for discharge home today. Encouraged adequate intake of protein at meals. Provided list of foods that are good sources of protein for family to use after discharge home.  Son reports pt has been overall weight stable, but unsure of exact UBW. Per review of chart pt was 102 kg on 04/04/22. Currently documented this admission to be 95.7 kg (210.98 lbs). That is a weight loss of 6.3 kg or 6.2% weight over the past 5 months, which is not significant for time frame.  Medications reviewed and include: Vitamin B12 1000 micrograms daily, Decadron 6 mg daily, Novolog 0-9 units Q4hours, MVI daily  Labs reviewed: CBG 177-396. Phosphorus and Magnesium WNL today.  UOP: 1 occurrence unmeasured UOP in previous 24 hours  I/O: -258.8 mL since admission  Discussed with MD that calorie count receipts were discarded so unable to calculate results.  Son reporting good PO intake over the weekend and pt has had good PO today per documentation in chart. Per MD plan for discharge home today now.  NUTRITION - FOCUSED PHYSICAL EXAM:  Flowsheet Row Most Recent Value  Orbital Region No depletion  Upper Arm  Region Mild depletion  Thoracic and Lumbar Region No depletion  Buccal Region No depletion  Temple Region No depletion  Clavicle Bone Region No depletion  Clavicle and Acromion Bone Region No depletion  Scapular Bone Region No depletion  Dorsal Hand Mild depletion  Patellar Region Mild depletion  Anterior Thigh Region Mild depletion  Posterior Calf Region Moderate depletion  Edema (RD Assessment) None  Hair Reviewed  Eyes Reviewed  Mouth Reviewed  Skin Reviewed  Nails Reviewed       Diet Order:   Diet Order             Diet regular Room service appropriate? No; Fluid consistency: Thin  Diet effective now           Diet - low sodium heart healthy                  EDUCATION NEEDS:   No education needs have been identified at this time  Skin:  Skin Assessment: Reviewed RN Assessment  Last BM:  08/31/22 - medium type 6  Height:   Ht Readings from Last 1 Encounters:  04/04/22 5\' 6"  (1.676 m)   Weight:   Wt Readings from Last 1 Encounters:  08/27/22 95.7 kg   Ideal Body Weight:  64.5 kg  BMI:  Body mass index is 34.05 kg/m.  Estimated Nutritional Needs:   Kcal:  2000-2200  Protein:  100-110 grams  Fluid:  2-2.2 L/day  Letta Median, MS, RD, LDN, CNSC Pager number available on Amion

## 2022-08-31 NOTE — Discharge Summary (Signed)
Physician Discharge Summary  Patient ID: Christian Morgan MRN: [id number] DOB/AGE: 84-Nov-1942 84 y.o.  Admit date: 08/27/2022 Discharge date: 08/31/2022  Admission Diagnoses:  Discharge Diagnoses:  Principal Problem:   COVID-19 virus infection Active Problems:   COVID-19   Discharged Condition: stable  Hospital Course:  Patient is an 82 year old male with past medical history significant for dementia, Parkinson's disease, unspecified dysphagia, gross hematuria, nephrolithiasis, hyperlipidemia and GERD.  Patient was admitted with COVID-19 infection.  Patient was admitted and managed with remdesivir.  Dexamethasone 6 Mg p.o. once daily was added.  Fever has resolved.  With the above management, patient improved significantly.  Elevated blood sugar was noted.  Please monitor closely, considering the patient is on steroids.  Patient has been optimized diabetes Debrox completed, primary care provider.  COVID-19 viral infection -Treated with remdesivir and dexamethasone.   -Fever has resolved. -Elevated inflammatory markers noted. -Supportive care. -Follow inflammatory markers.  Currently not hypoxic Airborne precautions   Acute metabolic encephalopathy suspect in the setting of COVID-19 viral infection -Baseline unknown. -Improved significantly during the hospital stay.   -Underlying dementia -UA was nonrevealing, except for likely volume depletion. -Urine cultures negative to date.   -CT head was nonacute but showed the following findings: Chronic left side craniotomy. Progression since 2022 of cerebralwhite matter changes, most commonly due to chronic small vessel disease. -Delirium, fall, and aspiration precautions were in place.   Unspecified dysphagia -Speech evaluation is appreciated. -Query related to Parkinson's disease.   -Aspiration precautions.   -The patient had a barium swallow study done on 08/14/2022 which showed transient laryngeal penetration without aspiration,  mild presbyesophagus.  The patient was unable to swallow the 13 mm barium tablet. 08/29/2022: Speech evaluation is appreciated.   Type 2 diabetes with hyperlipidemia -Continue to monitor CBG. -Caloric count. -Continued to monitor and optimize.     CKD 3A No recent prior records to compare Presented with creatinine 1.28 with GFR 56 Start gentle IV fluid hydration Serum creatinine today, 08/28/2022 was 1.23 with GFR of 58. -Stable.   Dementia: Parkinson's disease: Resume home regimen when able to swallow safely Avoid mind altering medications whenever possible.   Chronic anxiety/depression Resume home regimen when able to swallow safely Avoid mind altering medications as much as possible.   Physical debility Fall at home, no signs of trauma on exam-CT head non acute. PT OT assessment Fall precautions    Consults: None  Significant Diagnostic Studies:  SARS Coronavirus 2 by RT PCR was positive   Discharge Exam: Blood pressure 113/63, pulse 63, temperature 97.9 F (36.6 C), temperature source Oral, resp. rate 18, weight 95.7 kg, SpO2 97 %.   Disposition: Discharge disposition: 06-Home-Health Care Svc       Discharge Instructions     Diet - low sodium heart healthy   Complete by: As directed    Face-to-face encounter (required for Medicare/Medicaid patients)   Complete by: As directed    I Barnetta Chapel certify that this patient is under my care and that I, or a nurse practitioner or physician's assistant working with me, had a face-to-face encounter that meets the physician face-to-face encounter requirements with this patient on 08/31/2022. The encounter with the patient was in whole, or in part for the following medical condition(s) which is the primary reason for home health care (List medical condition):   The encounter with the patient was in whole, or in part, for the following medical condition, which is the primary reason for home health care:  Covid 19  infection   I certify that, based on my findings, the following services are medically necessary home health services: Physical therapy   Reason for Medically Necessary Home Health Services: Therapy- Therapeutic Exercises to Increase Strength and Endurance   My clinical findings support the need for the above services: Unable to leave home safely without assistance and/or assistive device   Further, I certify that my clinical findings support that this patient is homebound due to: Unable to leave home safely without assistance   Home Health   Complete by: As directed    To provide the following care/treatments: PT   Increase activity slowly   Complete by: As directed       Allergies as of 08/31/2022       Reactions   Lipitor [atorvastatin] Other (See Comments)   Increased liver enzymes and joint pain from numerous statins   Penicillins Hives, Rash   Amitriptyline Hcl Other (See Comments)   Unknown reaction   Cyclobenzaprine Other (See Comments)   Burning in stomach   Gabapentin Other (See Comments)   Dizziness    Other Other (See Comments)   [place]: "Terrible migraines."   Zetia [ezetimibe] Other (See Comments)   Joint pain   Aspirin Other (See Comments)   Upset stomach   Doxycycline Hives   Reaction took 4-5 days   Iodinated Contrast Media Hives   Developed hives after contrast "50 years ago when I had a subdural hematoma" at age 64.  Tolerates now with Benadryl 50mg  PO one hour before contrast.   Levofloxacin Hives   Delayed allergic reaction        Medication List     STOP taking these medications    Coconut Oil 1000 MG Caps   QUEtiapine 25 MG tablet Commonly known as: SEROquel   Turmeric 500 MG Tabs       TAKE these medications    acetaminophen 500 MG tablet Commonly known as: TYLENOL Take 500 mg by mouth every 6 (six) hours as needed for moderate pain or headache.   carbidopa-levodopa 25-100 MG tablet Commonly known as: SINEMET IR TAKE 1 TABLET BY  MOUTH THREE TIMES A DAY   cyanocobalamin 1000 MCG tablet Commonly known as: VITAMIN B12 Take 1,000 mcg by mouth daily.   donepezil 10 MG tablet Commonly known as: ARICEPT TAKE 1 TABLET BY MOUTH EVERYDAY AT BEDTIME What changed: See the new instructions.   escitalopram 10 MG tablet Commonly known as: LEXAPRO Take 5 mg by mouth daily.   feeding supplement Liqd Take 237 mLs by mouth 3 (three) times daily between meals.   Ipratropium-Albuterol 20-100 MCG/ACT Aers respimat Commonly known as: COMBIVENT Inhale 1 puff into the lungs every 6 (six) hours as needed for wheezing or shortness of breath.   levocetirizine 5 MG tablet Commonly known as: XYZAL Take 5 mg by mouth daily as needed for allergies.   losartan 100 MG tablet Commonly known as: COZAAR Take 100 mg by mouth daily.   metFORMIN 500 MG 24 hr tablet Commonly known as: GLUCOPHAGE-XR Take 1,000 mg by mouth 2 (two) times daily.   multivitamin with minerals Tabs tablet Take 1 tablet by mouth daily. Start taking on: September 01, 2022   rosuvastatin 5 MG tablet Commonly known as: CRESTOR Take 5 mg by mouth every evening.        Follow-up Information     [facility] Follow up.   Specialty: Rehabilitation Why: Physical therapy. They will call to schedule follow  up appointment after discharged home. Contact information: [address] [id number] mc [place] [address] [phone]               Time spent: 35 minutes.  SignedBarnetta Chapel 08/31/2022, 3:39 PM

## 2022-08-31 NOTE — Inpatient Diabetes Management (Addendum)
Inpatient Diabetes Program Recommendations  AACE/ADA: New Consensus Statement on Inpatient Glycemic Control (2015)  Target Ranges:  Prepandial:   less than 140 mg/dL      Peak postprandial:   less than 180 mg/dL (1-2 hours)      Critically ill patients:  140 - 180 mg/dL   Lab Results  Component Value Date   GLUCAP 189 (H) 08/31/2022   HGBA1C 7.2 (H) 08/27/2022    Review of Glycemic Control  Latest Reference Range & Units 08/30/22 07:47 08/30/22 11:16 08/30/22 15:35 08/30/22 20:44 08/30/22 23:46 08/31/22 04:27 08/31/22 07:15  Glucose-Capillary 70 - 99 mg/dL 409 (H) 811 (H) 914 (H) 428 (H) 276 (H) 177 (H) 189 (H)  (H): Data is abnormally high  Diabetes history: DM2 Outpatient Diabetes medications: Metformin 1000 mg BID Current orders for Inpatient glycemic control: Novolog 0-9 units Q4H, Decadron 6 mg QD  Inpatient Diabetes Program Recommendations:    If postprandials remain elevated, might consider:  Novolog 3 units TID if he consumes at least 50% Novolog 0-9 units TID   Will continue to follow while inpatient.  Thank you, Dulce Sellar, MSN, CDCES Diabetes Coordinator Inpatient Diabetes Program [phone] (team pager from 8a-5p)

## 2022-08-31 NOTE — Progress Notes (Signed)
Physical Therapy Treatment Patient Details Name: Christian Morgan MRN: [id number] DOB: 19-Apr-1940 Today's Date: 08/31/2022   History of Present Illness 82 y.o. male admitted 6/27 from home due to altered mental status x 1 day and after rolling out of bed at home overnight, found to be COVID +.Past medical history significant for dementia, Parkinson's disease, unspecified dysphagia, history of gross hematuria and nephrolithiasis, chronic anxiety/depression, hyperlipidemia, GERD.    PT Comments  Pt tolerated treatment well today. Pt demonstrated much improved mobility compared to previous sessions. Pt was able to ambulate around room with RW and per son is likely close to his baseline. Per conversation with pt son outside of room, pt has adequate support at home and family feels that they can provide level of assistance required. DC recs updated to OPPT with BSC. PT will continue to follow    Assistance Recommended at Discharge Frequent or constant Supervision/Assistance  If plan is discharge home, recommend the following:  Can travel by private vehicle    Assistance with cooking/housework;Direct supervision/assist for medications management;Direct supervision/assist for financial management;Assist for transportation;Help with stairs or ramp for entrance;A little help with walking and/or transfers;A little help with bathing/dressing/bathroom;Assistance with feeding   Yes  Equipment Recommendations  BSC/3in1    Recommendations for Other Services       Precautions / Restrictions Precautions Precautions: Fall;Other (comment) Precaution Comments: COVID + Restrictions Weight Bearing Restrictions: No     Mobility  Bed Mobility Overal bed mobility: Needs Assistance Bed Mobility: Supine to Sit, Sit to Supine, Rolling Rolling: Supervision   Supine to sit: Supervision Sit to supine: Supervision   General bed mobility comments: Pt able to complete bed mobility on his own.     Transfers Overall transfer level: Needs assistance Equipment used: Rolling walker (2 wheels) Transfers: Sit to/from Stand Sit to Stand: Min guard           General transfer comment: Min G for safety. PT had to hold RW down despite verbal cues for hand placement.    Ambulation/Gait Ambulation/Gait assistance: Min guard Gait Distance (Feet): 20 Feet Assistive device: Rolling walker (2 wheels) Gait Pattern/deviations: Trunk flexed, Decreased stride length, Step-through pattern Gait velocity: decreased     General Gait Details: Cues for turning and obstacle navigation.   Stairs             Wheelchair Mobility     Tilt Bed    Modified Rankin (Stroke Patients Only)       Balance Overall balance assessment: Needs assistance, History of Falls Sitting-balance support: Feet supported, No upper extremity supported Sitting balance-Leahy Scale: Good Sitting balance - Comments: Sat EOB   Standing balance support: Bilateral upper extremity supported, Reliant on assistive device for balance, During functional activity Standing balance-Leahy Scale: Poor Standing balance comment: Reliant on RW                            Cognition Arousal/Alertness: Awake/alert Behavior During Therapy: Impulsive Overall Cognitive Status: History of cognitive impairments - at baseline                                 General Comments: Pt somewhat impulsive today and had sitter at bedside. Pt likely at baseline for cognition.        Exercises      General Comments General comments (skin integrity, edema, etc.): VSS on RA  Pertinent Vitals/Pain Pain Assessment Pain Assessment: No/denies pain    Home Living                          Prior Function            PT Goals (current goals can now be found in the care plan section) Progress towards PT goals: Progressing toward goals    Frequency    Min 3X/week      PT Plan  Discharge plan needs to be updated    Co-evaluation              AM-PAC PT "6 Clicks" Mobility   Outcome Measure  Help needed turning from your back to your side while in a flat bed without using bedrails?: A Little Help needed moving from lying on your back to sitting on the side of a flat bed without using bedrails?: A Little Help needed moving to and from a bed to a chair (including a wheelchair)?: A Little Help needed standing up from a chair using your arms (e.g., wheelchair or bedside chair)?: A Little Help needed to walk in hospital room?: A Little Help needed climbing 3-5 steps with a railing? : A Lot 6 Click Score: 17    End of Session Equipment Utilized During Treatment: Gait belt Activity Tolerance: Patient tolerated treatment well Patient left: in bed;with call bell/phone within reach;with nursing/sitter in room Nurse Communication: Mobility status PT Visit Diagnosis: Muscle weakness (generalized) (M62.81);History of falling (Z91.81);Difficulty in walking, not elsewhere classified (R26.2);Other symptoms and signs involving the nervous system (R29.898)     Time: 4696-2952 PT Time Calculation (min) (ACUTE ONLY): 32 min  Charges:    $Gait Training: 8-22 mins $Therapeutic Activity: 8-22 mins PT General Charges $$ ACUTE PT VISIT: 1 Visit                     Shela Nevin, PT, DPT Acute Rehab Services [phone]    Gladys Damme 08/31/2022, 2:53 PM

## 2022-08-31 NOTE — TOC Progression Note (Signed)
Transition of Care [facility]) - Progression Note    Patient Details  Name: Thelmer Klitz MRN: [id number] Date of Birth: [birth year]  Transition of [facility]) CM/SW Contact  Ronny Bacon, RN Phone Number: 08/31/2022, 2:22 PM  Clinical Narrative:  RNCM consulted to discuss with family plan for patient being discharged. Family does not want patient to go to SNF. Son Faraaz lives at the house and is willing to take FMLA to care for father if needed. Daughter Deliah works from home and is willing to come to the house when Belt cannot be there. A nephew, who is a Emergency planning/management officer and works from the house is less that 7 minutes from the home and can be available as needed. Patient has RW, Cane, raised toilet seat and tub bench at home. Son Markez has been taking patient to physical therapy services at [facility] without issues. Family cared for mother after her bypass surgery and she required extensive care, they feel they are fully capable of caring for their father. DME requested BSC and WC if possible. PT and Provider updated.    Expected Discharge Plan: Skilled Nursing Facility Barriers to Discharge: Continued Medical Work up, SNF Covid, SNF Pending bed offer, English as a second language teacher  Expected Discharge Plan and Services     Post Acute Care Choice: Skilled Nursing Facility Living arrangements for the past 2 months: Single Family Home                                       Social Determinants of Health (SDOH) Interventions SDOH Screenings   Depression (PHQ2-9): Low Risk  (07/27/2019)  Tobacco Use: Medium Risk (08/27/2022)    Readmission Risk Interventions     No data to display

## 2022-08-31 NOTE — Plan of Care (Signed)
  Problem: Coping: Goal: Ability to adjust to condition or change in health will improve Outcome: Adequate for Discharge   Problem: Fluid Volume: Goal: Ability to maintain a balanced intake and output will improve Outcome: Adequate for Discharge   Problem: Metabolic: Goal: Ability to maintain appropriate glucose levels will improve Outcome: Adequate for Discharge   Problem: Clinical Measurements: Goal: Will remain free from infection Outcome: Adequate for Discharge   Problem: Clinical Measurements: Goal: Diagnostic test results will improve Outcome: Adequate for Discharge   Problem: Nutrition: Goal: Adequate nutrition will be maintained Outcome: Adequate for Discharge

## 2022-08-31 NOTE — Progress Notes (Signed)
Christian Morgan to be D/C'd  per MD order.  Discussed with the patient's son Leveck 2nd) and Deliah-daughter on the phone questions fully answered.  VSS, Skin clean, dry and intact without evidence of skin break down, no evidence of skin tears noted.  No IV on discharge.  An After Visit Summary was printed and given to the patient. Patient received prescription for multivitamin, son stated " they have some at home".  D/c education completed with patient/family including follow up instructions, medication list, d/c activities limitations if indicated, with other d/c instructions as indicated by MD - patient able to verbalize understanding, all questions fully answered.   Patient instructed to return to ED, call 911, or call MD for any changes in condition.   Patient to be escorted via WC, and D/C home via private auto.

## 2022-08-31 NOTE — Progress Notes (Signed)
Spoke with daughterEbony Hail on the phone this afternoon. She is concern about her father going SNF. She rather have him to go home with family and they could take him to outpatient PT. This nurse explain to daughter the significant of the patient going to SNF, but she wants the doctor to give her a call and explain it to her.

## 2022-09-01 LAB — CULTURE, BLOOD (ROUTINE X 2)
Culture: NO GROWTH
Culture: NO GROWTH
Special Requests: ADEQUATE

## 2022-09-05 ENCOUNTER — Other Ambulatory Visit: Payer: Self-pay | Admitting: Neurology

## 2022-09-07 NOTE — Progress Notes (Unsigned)
Assessment/Plan:   1.  Parkinsons Disease  -Continue carbidopa/levodopa 25/100, 1 tablet 3 times per day.  Levodopa is taking at strange times a day because of son's work schedule.  -order sent for PT/OT  -son may need some FMLA to assist  -information given to [facility] and [facility]   -he is seeing dermatology  2.  Dementia  -Had neurocognitive testing in June, 2021 with evidence of dementia, both parkinsonism and Alzheimer's.  -Continue donepezil, 10 mg daily.  -sleep schedule continues to be an issue.  They don't want seroquel to assist right now  3.  B12 deficiency  -Continue over-the-counter B12  4.  ? TGA  -Patient had an episode in July, 2022 where he had trouble remembering the people around him.  Patient does have baseline dementia, so difficult exactly to say what this was.  CT brain was negative.  Subjective:   Christian Morgan was seen today in follow up for parkinsonism.  My previous records were reviewed prior to todays visit as well as outside records available to me.  Patient is with his son who supplements the history.  Daughter on phone and supplements hx.  We added low-dose quetiapine last visit, primarily to help with consolidating his sleep and then/night reversal.  That being said, much of this was because his son works an alternative work schedule and the patient gets up in the middle of the night and eats when his son comes home.  The patients decided not to give him the seroquel after reading it was for people with hallucinations and "I didn't want to change his brain chemistry." Patient was just recently admitted to the hospital for a few days with COVID-19 and associated confusion.  Patient/family declined SNF at discharge.  They are planning to schedule PT to come to the home but wanted to have this appt first.  Son had next 2 weeks off and then wanted FMLA.    Current prescribed movement disorder medications: Carbidopa/levodopa 25/100, 1 tablet 3 times per  day  Quetiapine, 25 mg at bedtime (not taking) Donepezil, 10 mg daily  B12 supplement  PREVIOUS MEDICATIONS: Sinemet; seroquel (given but not taken)  ALLERGIES:   Allergies  Allergen Reactions   Lipitor [Atorvastatin] Other (See Comments)    Increased liver enzymes and joint pain from numerous statins   Penicillins Hives and Rash   Amitriptyline Hcl Other (See Comments)    Unknown reaction   Cyclobenzaprine Other (See Comments)    Burning in stomach   Gabapentin Other (See Comments)    Dizziness    Other Other (See Comments)    [place]: "Terrible migraines."   Zetia [Ezetimibe] Other (See Comments)    Joint pain   Aspirin Other (See Comments)    Upset stomach   Doxycycline Hives    Reaction took 4-5 days   Iodinated Contrast Media Hives    Developed hives after contrast "50 years ago when I had a subdural hematoma" at age 89.  Tolerates now with Benadryl 50mg  PO one hour before contrast.   Levofloxacin Hives    Delayed allergic reaction    CURRENT MEDICATIONS:  Outpatient Encounter Medications as of 09/08/2022  Medication Sig   acetaminophen (TYLENOL) 500 MG tablet Take 500 mg by mouth every 6 (six) hours as needed for moderate pain or headache.   carbidopa-levodopa (SINEMET IR) 25-100 MG tablet TAKE 1 TABLET BY MOUTH THREE TIMES A DAY   donepezil (ARICEPT) 10 MG tablet TAKE 1 TABLET BY  MOUTH EVERYDAY AT BEDTIME (Patient taking differently: Take 10 mg by mouth at bedtime.)   escitalopram (LEXAPRO) 10 MG tablet Take 5 mg by mouth daily.    feeding supplement (ENSURE ENLIVE / ENSURE PLUS) LIQD Take 237 mLs by mouth 3 (three) times daily between meals.   Ipratropium-Albuterol (COMBIVENT) 20-100 MCG/ACT AERS respimat Inhale 1 puff into the lungs every 6 (six) hours as needed for wheezing or shortness of breath.   levocetirizine (XYZAL) 5 MG tablet Take 5 mg by mouth daily as needed for allergies.   losartan (COZAAR) 100 MG tablet Take 100 mg by mouth daily.   metFORMIN  (GLUCOPHAGE-XR) 500 MG 24 hr tablet Take 1,000 mg by mouth 2 (two) times daily.   Multiple Vitamin (MULTIVITAMIN WITH MINERALS) TABS tablet Take 1 tablet by mouth daily.   rosuvastatin (CRESTOR) 5 MG tablet Take 5 mg by mouth every evening.   vitamin B-12 (CYANOCOBALAMIN) 1000 MCG tablet Take 1,000 mcg by mouth daily.   No facility-administered encounter medications on file as of 09/08/2022.    Objective:   PHYSICAL EXAMINATION:    VITALS:   Vitals:   09/08/22 0918  BP: (!) 106/50  Pulse: 82  SpO2: 98%  Height: 5\' 6"  (1.676 m)     GEN:  The patient appears stated age and is in NAD. HEENT:  Normocephalic, atraumatic.  The mucous membranes are moist.   Neurological examination:  Orientation: The patient is alert and oriented x2. Cranial nerves: There is good facial symmetry with mild facial hypomimia. The speech is fluent and hypophonic and lacks spontaneity. Soft palate rises symmetrically and there is no tongue deviation. Hearing is decreased to conversational tone. Sensation: Sensation is intact to light touch throughout Motor: Strength is at least antigravity x4.  Movement examination: Tone: There is normal tone today in the UE/LE Abnormal movements: there is rare tremor on the R Coordination:  There is slowness with all RAMs, R>L Gait and Station: The patient pushes off to arise. He is given his cane.  He shuffles and is forward flexed.  The R leg drags with ambulation.  This is same as previous visits  I have reviewed and interpreted the following labs independently    Chemistry      Component Value Date/Time   NA 131 (L) 08/30/2022 1150   K 4.0 08/30/2022 1150   CL 99 08/30/2022 1150   CO2 22 08/30/2022 1150   BUN 40 (H) 08/30/2022 1150   CREATININE 1.43 (H) 08/30/2022 1150      Component Value Date/Time   CALCIUM 9.2 08/30/2022 1150   ALKPHOS 49 08/28/2022 0822   AST 22 08/28/2022 0822   ALT 15 08/28/2022 0822   BILITOT 0.8 08/28/2022 0822       Lab  Results  Component Value Date   WBC 13.7 (H) 08/30/2022   HGB 12.8 (L) 08/30/2022   HCT 37.8 (L) 08/30/2022   MCV 90.0 08/30/2022   PLT 269 08/30/2022    No results found for: "TSH"  Total time spent on today's visit was 32 minutes, including both face-to-face time and nonface-to-face time.  Time included that spent on review of records (prior notes available to me/labs/imaging if pertinent), discussing treatment and goals, answering patient's questions and coordinating care.   Cc:  [name], MD

## 2022-09-08 ENCOUNTER — Ambulatory Visit: Payer: Medicare Other | Admitting: Neurology

## 2022-09-08 ENCOUNTER — Encounter: Payer: Self-pay | Admitting: Neurology

## 2022-09-08 VITALS — BP 106/50 | HR 82 | Ht 66.0 in | Wt 220.0 lb

## 2022-09-08 DIAGNOSIS — I10 Essential (primary) hypertension: Secondary | ICD-10-CM | POA: Diagnosis not present

## 2022-09-08 DIAGNOSIS — G20A1 Parkinson's disease without dyskinesia, without mention of fluctuations: Secondary | ICD-10-CM

## 2022-09-08 DIAGNOSIS — R531 Weakness: Secondary | ICD-10-CM

## 2022-09-08 DIAGNOSIS — E1165 Type 2 diabetes mellitus with hyperglycemia: Secondary | ICD-10-CM | POA: Diagnosis not present

## 2022-09-30 ENCOUNTER — Ambulatory Visit: Payer: Medicare Other | Attending: Internal Medicine | Admitting: Physical Therapy

## 2022-09-30 ENCOUNTER — Ambulatory Visit: Payer: Medicare Other | Admitting: Occupational Therapy

## 2022-09-30 ENCOUNTER — Telehealth: Payer: Self-pay | Admitting: Physical Therapy

## 2022-09-30 DIAGNOSIS — R293 Abnormal posture: Secondary | ICD-10-CM | POA: Insufficient documentation

## 2022-09-30 DIAGNOSIS — R29818 Other symptoms and signs involving the nervous system: Secondary | ICD-10-CM

## 2022-09-30 DIAGNOSIS — M6281 Muscle weakness (generalized): Secondary | ICD-10-CM | POA: Insufficient documentation

## 2022-09-30 DIAGNOSIS — R2689 Other abnormalities of gait and mobility: Secondary | ICD-10-CM | POA: Insufficient documentation

## 2022-09-30 DIAGNOSIS — R531 Weakness: Secondary | ICD-10-CM | POA: Insufficient documentation

## 2022-09-30 DIAGNOSIS — R2681 Unsteadiness on feet: Secondary | ICD-10-CM | POA: Insufficient documentation

## 2022-09-30 DIAGNOSIS — G20A1 Parkinson's disease without dyskinesia, without mention of fluctuations: Secondary | ICD-10-CM | POA: Insufficient documentation

## 2022-09-30 NOTE — Therapy (Signed)
  OUTPATIENT OCCUPATIONAL THERAPY PARKINSON'S EVALUATION  Patient Name: Christian Morgan MRN: [id number] DOB:07/23/1940, 82 y.o., male Today's Date: 09/30/2022   REFERRING PROVIDER: Vladimir Faster, DO   Following PT evaluation. Determination was made with PT that home health might be a better therapy setting given cognitive impairments. Information also provided on what to expect with HHOT and [facility] to help with navigating caregiver needs as pt's wife is also needing caregiver assistance in the home. Also discussed option for [facility] per Dr. Don Perking note as pt likes to be outside the home. Family was encouraged to contact therapy clinic with additional therapy concerns or questions.   Plan: [facility]  Shelbie Proctor, OTR/L    [facility]  [address] [place], [place] [address] Phone: [phone]  Fax: [phone]

## 2022-09-30 NOTE — Therapy (Signed)
Patient Name: Christian Morgan MRN: [id number] DOB:Oct 08, 1940, 82 y.o., male Today's Date: 09/30/2022   PT End of Session - 09/30/22 1325     Visit Number 1    Number of Visits 1    Authorization Type [employer] Medicare    PT Start Time 1316    PT Stop Time 1400    PT Time Calculation (min) 44 min    Activity Tolerance Patient tolerated treatment well    Behavior During Therapy [facility] for tasks assessed/performed            Pt arrived to scheduled PT session w/son, Joey, and daughter. Son recalled series of events since July 1 when pt got COVID and was in hospital for 5 days. Son reports they attempted to place pt in SNF, but no beds were available. Were told that they needed to come here for PT/OT evals to determine if pt needs home health aide, home health PT/OT or come to outpatient. Son reports pt has not been moving well since getting COVID and cannot walk short distances without getting tired. Son is currently dressing and bathing pt, as pt cannot dress himself. Therapist and family in agreement that pt more appropriate for HHPT and HHOT at this time to work on ADLs at home and increase endurance and then may return to OPPT to work on Print production planner. Due to pt's impaired cognition, pt will do better in closed, familiar environment (his house). Therapist to contact [name] and request HHPT and [facility] referrals today. Provided family w/[facility] information as well to obtain home aide.    Nekia Maxham E Charliegh Vasudevan, PT 09/30/2022, 2:03 PM

## 2022-09-30 NOTE — Telephone Encounter (Signed)
Dr. Samuel Jester. Christian Morgan was evaluated by PT and OT today. Due to impaired cognition, difficulty w/ADLS at home and safety concerns, the patient would benefit from home health PT and OT.   If you agree, please place an order to assist.   Thank you, Jill Alexanders Abisai Coble, PT, DPT [facility] [address] [place], [place]  [address] Phone:  [phone] Fax:  [phone]

## 2022-10-01 ENCOUNTER — Ambulatory Visit: Payer: Medicare Other | Admitting: Physical Therapy

## 2022-10-01 ENCOUNTER — Encounter: Payer: Medicare Other | Admitting: Occupational Therapy

## 2022-10-01 ENCOUNTER — Other Ambulatory Visit: Payer: Self-pay

## 2022-10-01 DIAGNOSIS — R531 Weakness: Secondary | ICD-10-CM

## 2022-10-01 DIAGNOSIS — G20A1 Parkinson's disease without dyskinesia, without mention of fluctuations: Secondary | ICD-10-CM

## 2022-10-01 DIAGNOSIS — Z7409 Other reduced mobility: Secondary | ICD-10-CM

## 2022-10-01 DIAGNOSIS — F0390 Unspecified dementia without behavioral disturbance: Secondary | ICD-10-CM

## 2022-10-01 NOTE — Telephone Encounter (Signed)
Order has been put in reaching out to [facility] to see if they can take this patient

## 2022-10-01 NOTE — Telephone Encounter (Signed)
Patient is set up to start home health tomorrow with [facility]

## 2022-10-02 ENCOUNTER — Telehealth: Payer: Self-pay | Admitting: Neurology

## 2022-10-02 DIAGNOSIS — E538 Deficiency of other specified B group vitamins: Secondary | ICD-10-CM | POA: Diagnosis not present

## 2022-10-02 DIAGNOSIS — U071 COVID-19: Secondary | ICD-10-CM | POA: Diagnosis not present

## 2022-10-02 DIAGNOSIS — G309 Alzheimer's disease, unspecified: Secondary | ICD-10-CM | POA: Diagnosis not present

## 2022-10-02 DIAGNOSIS — Z9181 History of falling: Secondary | ICD-10-CM | POA: Diagnosis not present

## 2022-10-02 DIAGNOSIS — G20A1 Parkinson's disease without dyskinesia, without mention of fluctuations: Secondary | ICD-10-CM | POA: Diagnosis not present

## 2022-10-02 NOTE — Telephone Encounter (Signed)
Called and orders given. 

## 2022-10-02 NOTE — Telephone Encounter (Signed)
Christian Morgan is calling from home health wanting to get verbal order for Home Health PT for 1 week 1, 2 week 3 and 1 week 3.

## 2022-10-06 DIAGNOSIS — Z9181 History of falling: Secondary | ICD-10-CM | POA: Diagnosis not present

## 2022-10-06 DIAGNOSIS — E538 Deficiency of other specified B group vitamins: Secondary | ICD-10-CM | POA: Diagnosis not present

## 2022-10-06 DIAGNOSIS — U071 COVID-19: Secondary | ICD-10-CM | POA: Diagnosis not present

## 2022-10-06 DIAGNOSIS — G20A1 Parkinson's disease without dyskinesia, without mention of fluctuations: Secondary | ICD-10-CM | POA: Diagnosis not present

## 2022-10-06 DIAGNOSIS — G309 Alzheimer's disease, unspecified: Secondary | ICD-10-CM | POA: Diagnosis not present

## 2022-10-06 IMAGING — CT CT HEAD W/O CM
1 series · 16 of 30 positions shown, 20 images · non-contrast
Comparison: MRI 08/03/2019.  Head CT 01/23/2017.

CLINICAL DATA: Mental status changes of unknown cause. Episodes of
confusion forgetfulness over the last week.

EXAM:
CT HEAD WITHOUT CONTRAST
TECHNIQUE: Contiguous axial images were obtained from the base of the skull
through the vertex without intravenous contrast.

[Series 2: head w/(date) · axial · 0.44mm/px · z∈[-160,-10]mm · 16 of 34 slices shown, 20 images]
[im 2/34  brain]
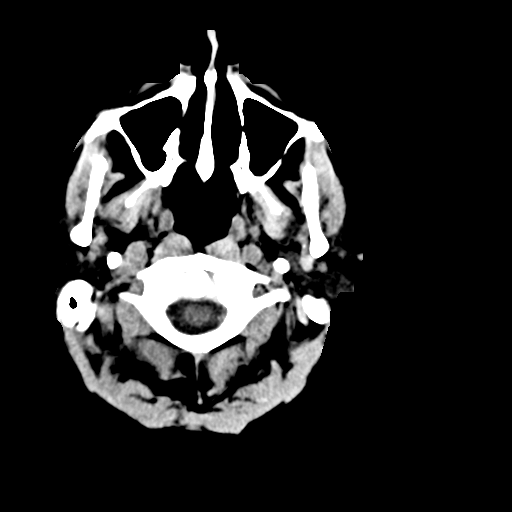
[im 2/34  bone]
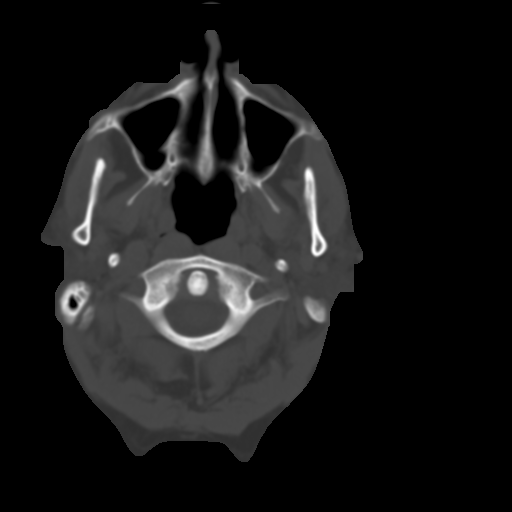
[im 4/34  brain]
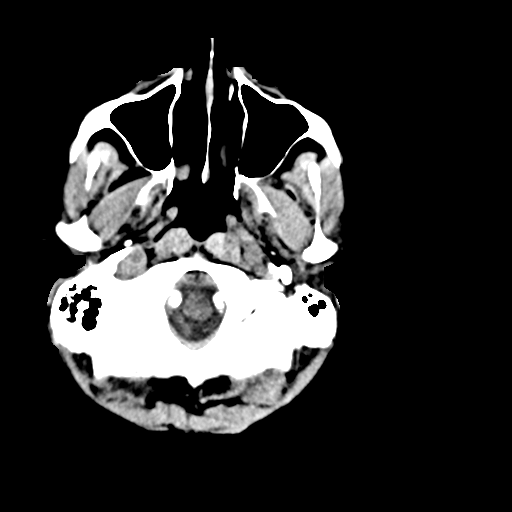
[im 6/34  brain]
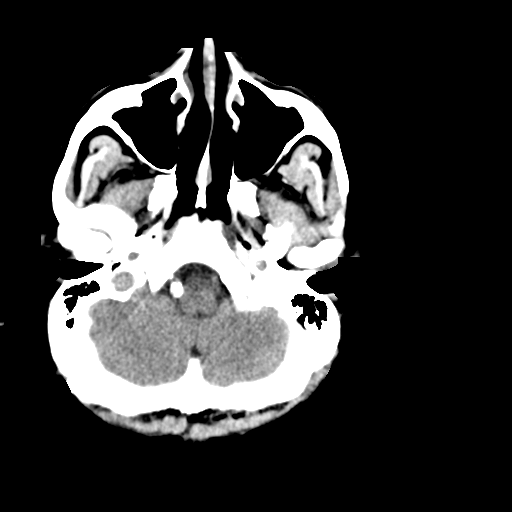
[im 8/34  brain]
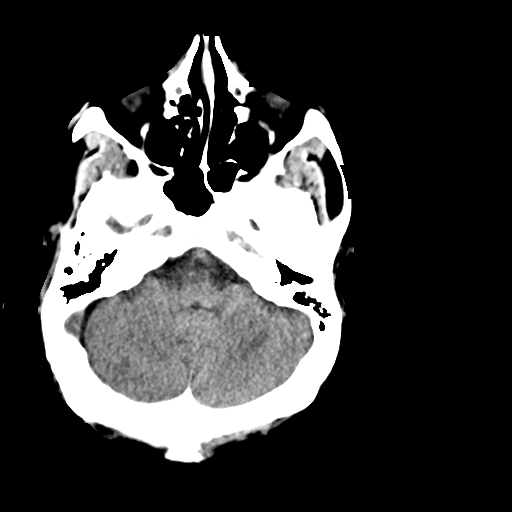
[im 10/34  brain]
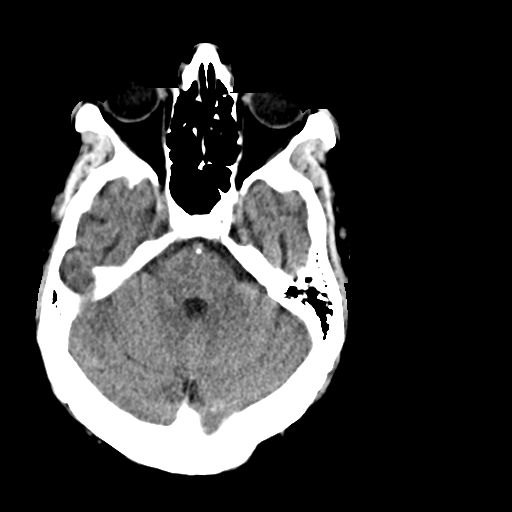
[im 10/34  bone]
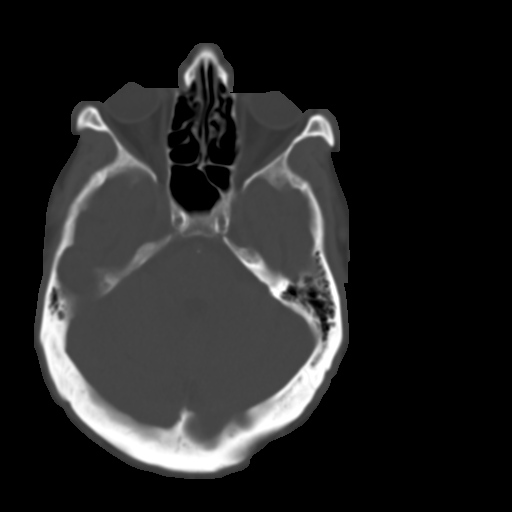
[im 12/34  brain]
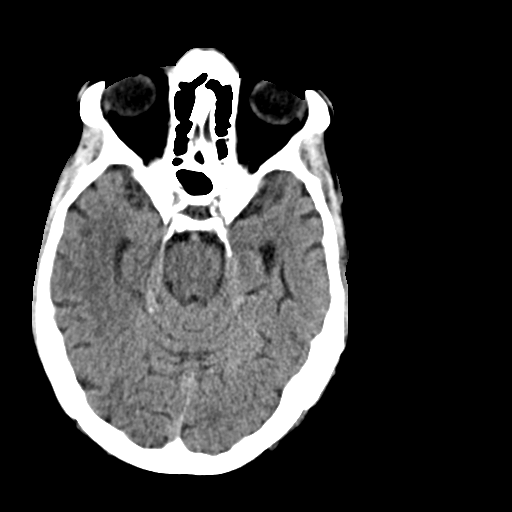
[im 14/34  brain]
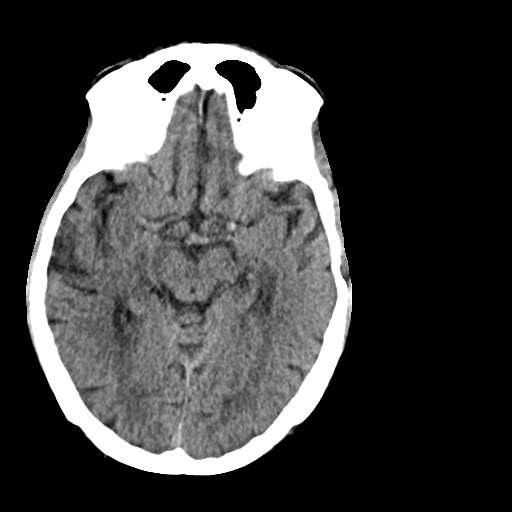
[im 16/34  brain]
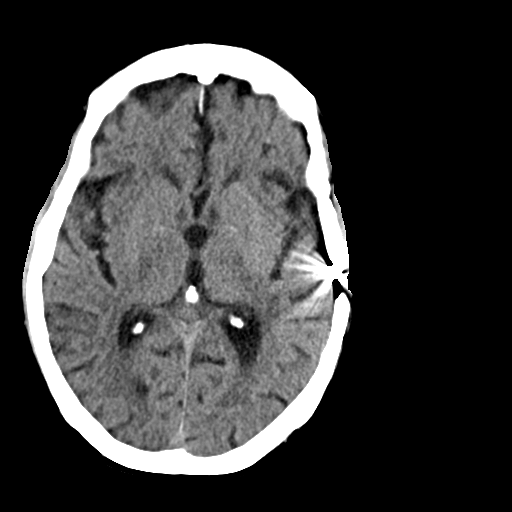
[im 18/34  brain]
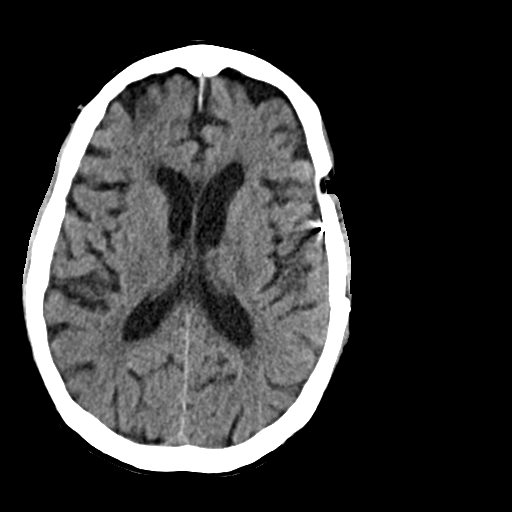
[im 18/34  bone]
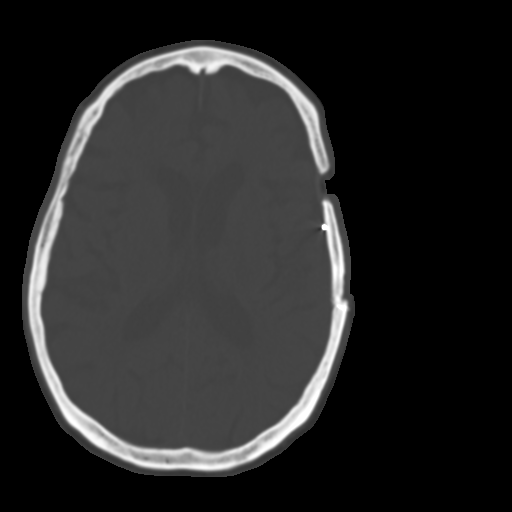
[im 20/34  brain]
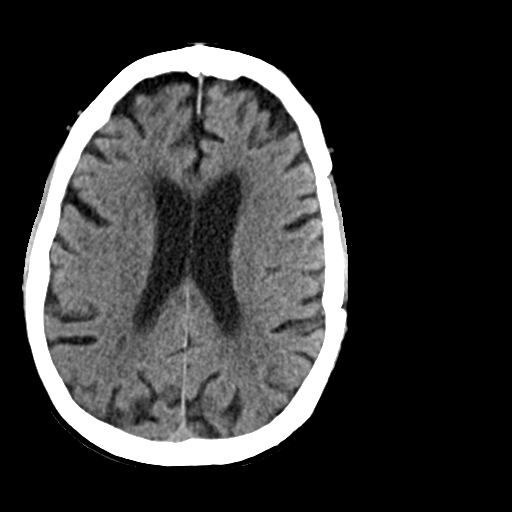
[im 22/34  brain]
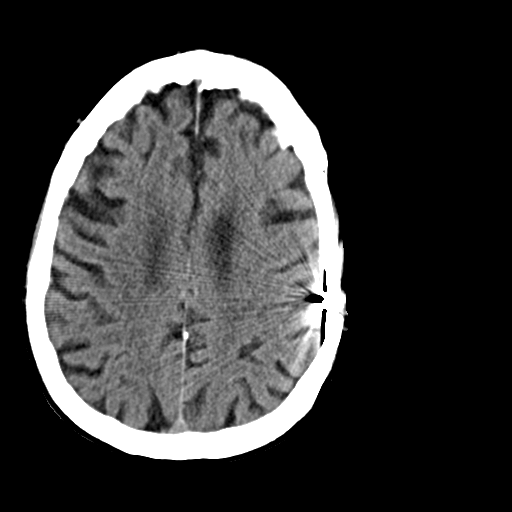
[im 24/34  brain]
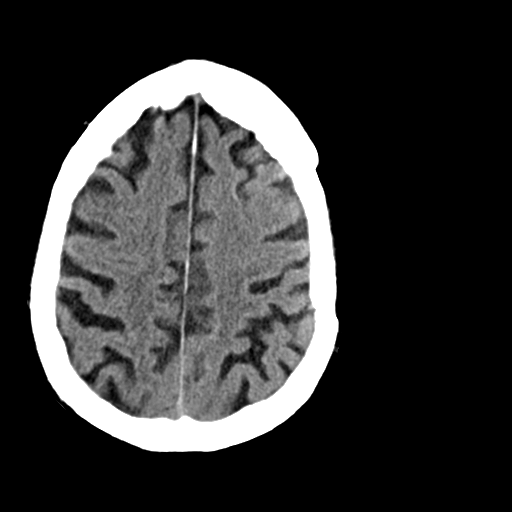
[im 26/34  brain]
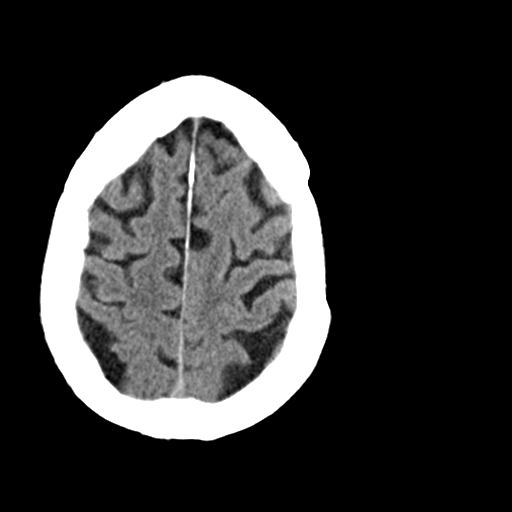
[im 26/34  bone]
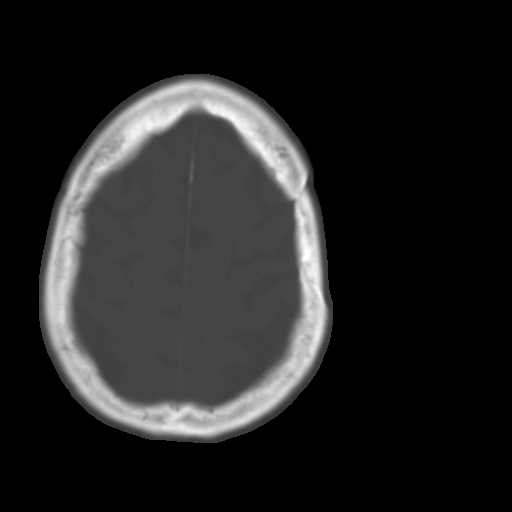
[im 28/34  brain]
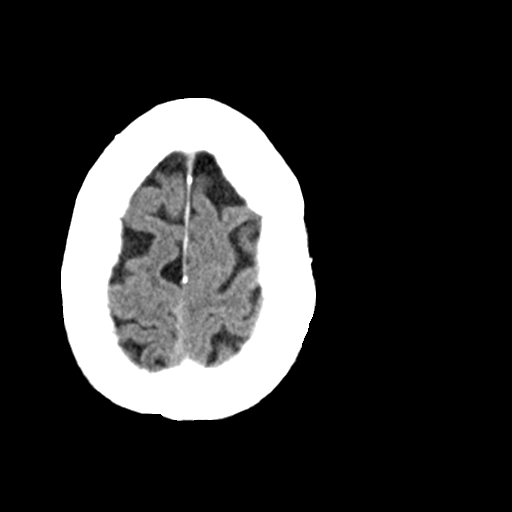
[im 30/34  brain]
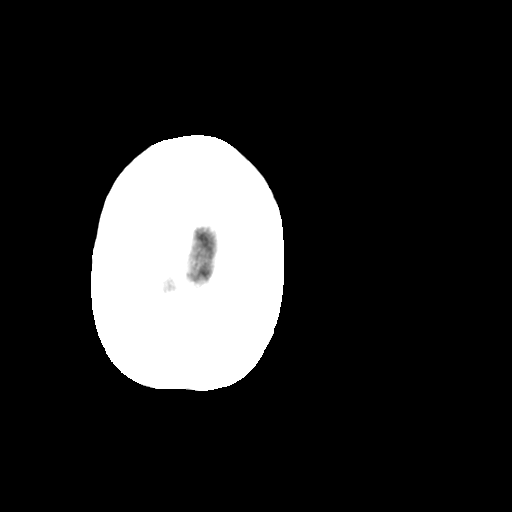
[im 32/34  brain]
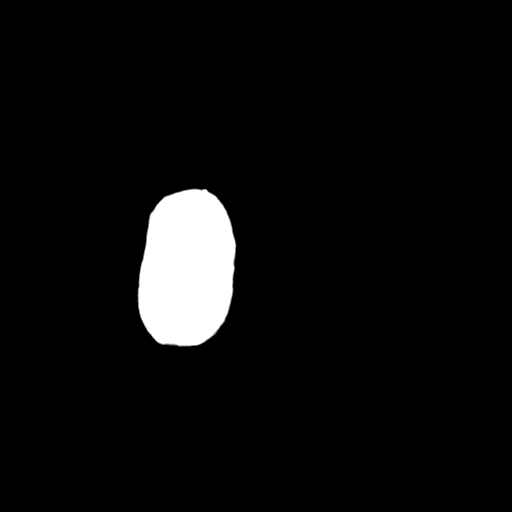

[16 of 30 positions shown; findings below may reference images not displayed]

FINDINGS: Brain: No acute finding by CT. No focal abnormality affects the
brainstem or cerebellum. Cerebral hemispheres show generalized
atrophy. No large vessel territory infarction. No mass, hemorrhage,
hydrocephalus or extra-axial collection.

Vascular: There is atherosclerotic calcification of the major
vessels at the base of the brain.

Skull: Distant left frontoparietal craniotomy.

Sinuses/Orbits: Clear/normal

Other: None
IMPRESSION: No change. No acute or reversible finding. Generalized cerebral
atrophy. Distant left frontotemporal craniotomy without complicating
feature.

## 2022-10-07 ENCOUNTER — Encounter (HOSPITAL_BASED_OUTPATIENT_CLINIC_OR_DEPARTMENT_OTHER): Payer: Self-pay

## 2022-10-07 ENCOUNTER — Emergency Department (HOSPITAL_BASED_OUTPATIENT_CLINIC_OR_DEPARTMENT_OTHER)
Admission: EM | Admit: 2022-10-07 | Discharge: 2022-10-07 | Disposition: A | Discharge status: Home / Self Care | Payer: Medicare Other | Attending: Emergency Medicine | Admitting: Emergency Medicine

## 2022-10-07 ENCOUNTER — Emergency Department (HOSPITAL_BASED_OUTPATIENT_CLINIC_OR_DEPARTMENT_OTHER): Payer: Medicare Other

## 2022-10-07 DIAGNOSIS — N200 Calculus of kidney: Secondary | ICD-10-CM | POA: Diagnosis not present

## 2022-10-07 DIAGNOSIS — E1165 Type 2 diabetes mellitus with hyperglycemia: Secondary | ICD-10-CM | POA: Diagnosis not present

## 2022-10-07 DIAGNOSIS — Z7984 Long term (current) use of oral hypoglycemic drugs: Secondary | ICD-10-CM | POA: Insufficient documentation

## 2022-10-07 DIAGNOSIS — I10 Essential (primary) hypertension: Secondary | ICD-10-CM | POA: Diagnosis not present

## 2022-10-07 DIAGNOSIS — R0989 Other specified symptoms and signs involving the circulatory and respiratory systems: Secondary | ICD-10-CM | POA: Diagnosis not present

## 2022-10-07 DIAGNOSIS — Z79899 Other long term (current) drug therapy: Secondary | ICD-10-CM | POA: Diagnosis not present

## 2022-10-07 DIAGNOSIS — D72829 Elevated white blood cell count, unspecified: Secondary | ICD-10-CM | POA: Diagnosis not present

## 2022-10-07 DIAGNOSIS — R310 Gross hematuria: Secondary | ICD-10-CM | POA: Diagnosis not present

## 2022-10-07 DIAGNOSIS — N281 Cyst of kidney, acquired: Secondary | ICD-10-CM | POA: Diagnosis not present

## 2022-10-07 DIAGNOSIS — R739 Hyperglycemia, unspecified: Secondary | ICD-10-CM | POA: Diagnosis not present

## 2022-10-07 LAB — URINALYSIS, ROUTINE W REFLEX MICROSCOPIC
Bilirubin Urine: NEGATIVE
Glucose, UA: >1000 mg/dL — AB
Ketones, ur: NEGATIVE mg/dL
Leukocytes,Ua: NEGATIVE
Nitrite: NEGATIVE
Specific Gravity, Urine: 1.037 — ABNORMAL HIGH (ref 1.005–1.030)
pH: 5.5 (ref 5.0–8.0)

## 2022-10-07 LAB — CBC
HCT: 35.1 % — ABNORMAL LOW (ref 39.0–52.0)
Hemoglobin: 11.9 g/dL — ABNORMAL LOW (ref 13.0–17.0)
MCH: 30.2 pg (ref 26.0–34.0)
MCHC: 33.9 g/dL (ref 30.0–36.0)
MCV: 89.1 fL (ref 80.0–100.0)
Platelets: 339 10*3/uL (ref 150–400)
RBC: 3.94 MIL/uL — ABNORMAL LOW (ref 4.22–5.81)
RDW: 14.1 % (ref 11.5–15.5)
WBC: 15.3 10*3/uL — ABNORMAL HIGH (ref 4.0–10.5)
nRBC: 0 % (ref 0.0–0.2)

## 2022-10-07 LAB — BASIC METABOLIC PANEL
Anion gap: 9 (ref 5–15)
BUN: 32 mg/dL — ABNORMAL HIGH (ref 8–23)
CO2: 24 mmol/L (ref 22–32)
Calcium: 10 mg/dL (ref 8.9–10.3)
Chloride: 99 mmol/L (ref 98–111)
Creatinine, Ser: 1.21 mg/dL (ref 0.61–1.24)
GFR, Estimated: 59 mL/min — ABNORMAL LOW (ref >60)
Glucose, Bld: 370 mg/dL — ABNORMAL HIGH (ref 70–99)
Potassium: 4 mmol/L (ref 3.5–5.1)
Sodium: 132 mmol/L — ABNORMAL LOW (ref 135–145)

## 2022-10-07 LAB — CBG MONITORING, ED
Glucose-Capillary: 354 mg/dL — ABNORMAL HIGH (ref 70–99)
Glucose-Capillary: 272 mg/dL — ABNORMAL HIGH (ref 70–99)

## 2022-10-07 MED ORDER — SODIUM CHLORIDE 0.9 % IV BOLUS
500.0000 mL | Freq: Once | INTRAVENOUS | Status: AC
  Administered 2022-10-07: 500 mL via INTRAVENOUS

## 2022-10-07 MED ORDER — INSULIN ASPART 100 UNIT/ML IJ SOLN
6.0000 [IU] | Freq: Once | INTRAMUSCULAR | Status: AC
  Administered 2022-10-07: 6 [IU] via INTRAVENOUS

## 2022-10-07 NOTE — ED Notes (Signed)
DC papers reviewed. No questions or concerns. No signs of distress. Pt assisted to wheelchair and out to lobby. Appropriate measures for safety taken. 

## 2022-10-07 NOTE — ED Provider Notes (Signed)
Christian Morgan EMERGENCY DEPARTMENT AT [facility] Provider Note   CSN: [id number] Arrival date & time: 10/07/22  1910     History  Chief Complaint  Patient presents with   Hyperglycemia    Christian Morgan is a 82 y.o. male.  Pt is a 82 yo male with pmhx significant for DM, htn, hld, osa, dm, and cognitive d/o.  Pt had a CT with contrast today at the urology office.  He is on metformin, so was told to hold that for 2 more days.  BS over 300, so family did not know what to do.  They called his doctor and they told him to come to the ED.  Pt does have a rx for glimepiride that he could take if bs over 300, but the provider they spoke with told him not to in case he had renal failure from the iv contrast.  Pt feels weak and thirsty, but is otherwise ok.         Home Medications Prior to Admission medications   Medication Sig Start Date End Date Taking? Authorizing Provider  acetaminophen (TYLENOL) 500 MG tablet Take 500 mg by mouth every 6 (six) hours as needed for moderate pain or headache.    [provider]  carbidopa-levodopa (SINEMET IR) 25-100 MG tablet TAKE 1 TABLET BY MOUTH THREE TIMES A DAY 07/31/22   Tat, Rebecca S, DO  donepezil (ARICEPT) 10 MG tablet TAKE 1 TABLET BY MOUTH EVERYDAY AT BEDTIME Patient taking differently: Take 10 mg by mouth at bedtime. 07/28/22   Tat, Octaviano Batty, DO  escitalopram (LEXAPRO) 10 MG tablet Take 5 mg by mouth daily.  12/08/19   [provider]  feeding supplement (ENSURE ENLIVE / ENSURE PLUS) LIQD Take 237 mLs by mouth 3 (three) times daily between meals. 08/31/22   Barnetta Chapel, MD  Ipratropium-Albuterol (COMBIVENT) 20-100 MCG/ACT AERS respimat Inhale 1 puff into the lungs every 6 (six) hours as needed for wheezing or shortness of breath. 08/31/22   Barnetta Chapel, MD  levocetirizine (XYZAL) 5 MG tablet Take 5 mg by mouth daily as needed for allergies.    [provider]  losartan (COZAAR) 100 MG tablet  Take 100 mg by mouth daily.    [provider]  metFORMIN (GLUCOPHAGE-XR) 500 MG 24 hr tablet Take 1,000 mg by mouth 2 (two) times daily.    [provider]  Multiple Vitamin (MULTIVITAMIN WITH MINERALS) TABS tablet Take 1 tablet by mouth daily. 09/01/22 10/31/22  Berton Mount I, MD  rosuvastatin (CRESTOR) 5 MG tablet Take 5 mg by mouth every evening.    [provider]  vitamin B-12 (CYANOCOBALAMIN) 1000 MCG tablet Take 1,000 mcg by mouth daily.    [provider]      Allergies    Lipitor [atorvastatin], Penicillins, Amitriptyline hcl, Cyclobenzaprine, Gabapentin, Other, Zetia [ezetimibe], Aspirin, Doxycycline, Iodinated contrast media, and Levofloxacin    Review of Systems   Review of Systems  Neurological:  Positive for weakness.  All other systems reviewed and are negative.   Physical Exam Updated Vital Signs BP (!) 162/92   Pulse 73   Temp 98.7 F (37.1 C)   Resp 19   Ht 5\' 6"  (1.676 m)   Wt 99.3 kg   SpO2 100%   BMI 35.35 kg/m  Physical Exam Vitals and nursing note reviewed.  Constitutional:      Appearance: Normal appearance. He is obese.  HENT:     Head: Normocephalic and  atraumatic.     Right Ear: External ear normal.     Left Ear: External ear normal.     Nose: Nose normal.     Mouth/Throat:     Mouth: Mucous membranes are dry.  Eyes:     Extraocular Movements: Extraocular movements intact.     Conjunctiva/sclera: Conjunctivae normal.     Pupils: Pupils are equal, round, and reactive to light.  Cardiovascular:     Rate and Rhythm: Normal rate and regular rhythm.     Pulses: Normal pulses.     Heart sounds: Normal heart sounds.  Pulmonary:     Effort: Pulmonary effort is normal.     Breath sounds: Normal breath sounds.  Abdominal:     General: Abdomen is flat. Bowel sounds are normal.     Palpations: Abdomen is soft.  Musculoskeletal:        General: Normal range of motion.     Cervical back: Normal range of motion  and neck supple.  Skin:    General: Skin is warm.     Capillary Refill: Capillary refill takes less than 2 seconds.  Neurological:     General: No focal deficit present.     Mental Status: He is alert. Mental status is at baseline.     Motor: Tremor present.  Psychiatric:        Mood and Affect: Mood normal.        Behavior: Behavior normal.     ED Results / Procedures / Treatments   Labs (all labs ordered are listed, but only abnormal results are displayed) Labs Reviewed  BASIC METABOLIC PANEL - Abnormal; Notable for the following components:      Result Value   Sodium 132 (*)    Glucose, Bld 370 (*)    BUN 32 (*)    GFR, Estimated 59 (*)    All other components within normal limits  CBC - Abnormal; Notable for the following components:   WBC 15.3 (*)    RBC 3.94 (*)    Hemoglobin 11.9 (*)    HCT 35.1 (*)    All other components within normal limits  URINALYSIS, ROUTINE W REFLEX MICROSCOPIC - Abnormal; Notable for the following components:   Specific Gravity, Urine 1.037 (*)    Glucose, UA >1,000 (*)    Hgb urine dipstick TRACE (*)    Protein, ur TRACE (*)    Bacteria, UA RARE (*)    All other components within normal limits  CBG MONITORING, ED - Abnormal; Notable for the following components:   Glucose-Capillary 354 (*)    All other components within normal limits  CBG MONITORING, ED - Abnormal; Notable for the following components:   Glucose-Capillary 272 (*)    All other components within normal limits  CBG MONITORING, ED    EKG None  Radiology DG Chest Portable 1 View  Result Date: 10/07/2022 CLINICAL DATA:  Hyperglycemia. EXAM: PORTABLE CHEST 1 VIEW COMPARISON:  Radiograph 08/27/2022, lung bases from abdominal CT earlier today FINDINGS: Lung volumes are low.The cardiomediastinal contours are normal. Pulmonary vasculature is normal. No consolidation, pleural effusion, or pneumothorax. No acute osseous abnormalities are seen. IMPRESSION: Low lung volumes  without acute chest finding. Electronically Signed   By: Narda Rutherford M.D.   On: 10/07/2022 21:33    Procedures Procedures    Medications Ordered in ED Medications  sodium chloride 0.9 % bolus 500 mL (0 mLs Intravenous Stopped 10/07/22 2239)  insulin aspart (novoLOG) injection 6 Units (6 Units  Intravenous Given 10/07/22 2140)    ED Course/ Medical Decision Making/ A&P                                 Medical Decision Making Amount and/or Complexity of Data Reviewed Labs: ordered. Radiology: ordered.  Risk Prescription drug management.   This patient presents to the ED for concern of hyperglycemia, this involves an extensive number of treatment options, and is a complaint that carries with it a high risk of complications and morbidity.  The differential diagnosis includes not able to take meds, infection   Co morbidities that complicate the patient evaluation  DM, htn, hld, osa, dm, and cognitive d/o   Additional history obtained:  Additional history obtained from epic chart review External records from outside source obtained and reviewed including son   Lab Tests:  I Ordered, and personally interpreted labs.  The pertinent results include:  cbc with wbc elevated at 15.3, glucose elevated at 370; ua neg   Imaging Studies ordered:  I ordered imaging studies including cxr  I independently visualized and interpreted imaging which showed Low lung volumes without acute chest finding.  I agree with the radiologist interpretation   Cardiac Monitoring:  The patient was maintained on a cardiac monitor.  I personally viewed and interpreted the cardiac monitored which showed an underlying rhythm of: nsr   Medicines ordered and prescription drug management:  I ordered medication including iv insulin/ivfs  for sx  Reevaluation of the patient after these medicines showed that the patient improved I have reviewed the patients home medicines and have made adjustments as  needed     Problem List / ED Course:  Hyperglycemia:  this is due to not taking metformin.  BS is down to 272 after insulin and fluids.  Pt is to take glimepiride daily until he can take metformin again.  Pt is stable for d/c.  Return if worse.    Reevaluation:  After the interventions noted above, I reevaluated the patient and found that they have :improved   Social Determinants of Health:  Lives at home   Dispostion:  After consideration of the diagnostic results and the patients response to treatment, I feel that the patent would benefit from discharge with outpatient f/u.          Final Clinical Impression(s) / ED Diagnoses Final diagnoses:  Hyperglycemia    Rx / DC Orders ED Discharge Orders     None         Jacalyn Lefevre, MD 10/07/22 2259

## 2022-10-07 NOTE — Discharge Instructions (Signed)
Take Glimepiride 1 mg daily while off the Metformin.

## 2022-10-07 NOTE — ED Notes (Signed)
CBG 231Particia Nearing MD notified.

## 2022-10-07 NOTE — ED Triage Notes (Signed)
Pt arrived POV with son for concern for hyperglycemia, CBG was 377. Pt has a CT scan done earlier and was given contrast medium, pt is on metformin but has not taken it since last night. Pt denies abd pain, n/v/d. Eating as normal HTN noted, otherwise VSS. A&O x4, NAD noted.

## 2022-10-08 DIAGNOSIS — G20A1 Parkinson's disease without dyskinesia, without mention of fluctuations: Secondary | ICD-10-CM | POA: Diagnosis not present

## 2022-10-08 DIAGNOSIS — Z9181 History of falling: Secondary | ICD-10-CM | POA: Diagnosis not present

## 2022-10-08 DIAGNOSIS — R739 Hyperglycemia, unspecified: Secondary | ICD-10-CM | POA: Diagnosis not present

## 2022-10-08 DIAGNOSIS — G309 Alzheimer's disease, unspecified: Secondary | ICD-10-CM | POA: Diagnosis not present

## 2022-10-08 DIAGNOSIS — U071 COVID-19: Secondary | ICD-10-CM | POA: Diagnosis not present

## 2022-10-08 DIAGNOSIS — E538 Deficiency of other specified B group vitamins: Secondary | ICD-10-CM | POA: Diagnosis not present

## 2022-10-09 DIAGNOSIS — U071 COVID-19: Secondary | ICD-10-CM | POA: Diagnosis not present

## 2022-10-09 DIAGNOSIS — G309 Alzheimer's disease, unspecified: Secondary | ICD-10-CM | POA: Diagnosis not present

## 2022-10-09 DIAGNOSIS — E538 Deficiency of other specified B group vitamins: Secondary | ICD-10-CM | POA: Diagnosis not present

## 2022-10-09 DIAGNOSIS — G20A1 Parkinson's disease without dyskinesia, without mention of fluctuations: Secondary | ICD-10-CM | POA: Diagnosis not present

## 2022-10-09 DIAGNOSIS — Z9181 History of falling: Secondary | ICD-10-CM | POA: Diagnosis not present

## 2022-10-12 ENCOUNTER — Telehealth: Payer: Self-pay | Admitting: Neurology

## 2022-10-12 NOTE — Telephone Encounter (Signed)
Marcelino Duster from [facility]  called on Friday, Patient has completed OT Eval. OT therapist is needing Verbal orders for 1 time a week for 6 weeks [phone]

## 2022-10-12 NOTE — Telephone Encounter (Signed)
Michelle from [facility] called an given  Verbal orders for 1 time a week for 6 weeks

## 2022-10-13 DIAGNOSIS — Z9181 History of falling: Secondary | ICD-10-CM | POA: Diagnosis not present

## 2022-10-13 DIAGNOSIS — E538 Deficiency of other specified B group vitamins: Secondary | ICD-10-CM | POA: Diagnosis not present

## 2022-10-13 DIAGNOSIS — G309 Alzheimer's disease, unspecified: Secondary | ICD-10-CM | POA: Diagnosis not present

## 2022-10-13 DIAGNOSIS — U071 COVID-19: Secondary | ICD-10-CM | POA: Diagnosis not present

## 2022-10-13 DIAGNOSIS — G20A1 Parkinson's disease without dyskinesia, without mention of fluctuations: Secondary | ICD-10-CM | POA: Diagnosis not present

## 2022-10-15 DIAGNOSIS — G20A1 Parkinson's disease without dyskinesia, without mention of fluctuations: Secondary | ICD-10-CM | POA: Diagnosis not present

## 2022-10-15 DIAGNOSIS — R739 Hyperglycemia, unspecified: Secondary | ICD-10-CM | POA: Diagnosis not present

## 2022-10-15 DIAGNOSIS — U071 COVID-19: Secondary | ICD-10-CM | POA: Diagnosis not present

## 2022-10-15 DIAGNOSIS — Z9181 History of falling: Secondary | ICD-10-CM | POA: Diagnosis not present

## 2022-10-15 DIAGNOSIS — E538 Deficiency of other specified B group vitamins: Secondary | ICD-10-CM | POA: Diagnosis not present

## 2022-10-15 DIAGNOSIS — G309 Alzheimer's disease, unspecified: Secondary | ICD-10-CM | POA: Diagnosis not present

## 2022-10-17 ENCOUNTER — Emergency Department (HOSPITAL_BASED_OUTPATIENT_CLINIC_OR_DEPARTMENT_OTHER)
Admission: EM | Admit: 2022-10-17 | Discharge: 2022-10-17 | Disposition: A | Discharge status: Home / Self Care | Payer: Medicare Other | Source: Home / Self Care | Attending: Emergency Medicine | Admitting: Emergency Medicine

## 2022-10-17 ENCOUNTER — Emergency Department (HOSPITAL_BASED_OUTPATIENT_CLINIC_OR_DEPARTMENT_OTHER): Payer: Medicare Other

## 2022-10-17 ENCOUNTER — Other Ambulatory Visit: Payer: Self-pay

## 2022-10-17 ENCOUNTER — Encounter (HOSPITAL_BASED_OUTPATIENT_CLINIC_OR_DEPARTMENT_OTHER): Payer: Self-pay | Admitting: Emergency Medicine

## 2022-10-17 ENCOUNTER — Emergency Department (HOSPITAL_BASED_OUTPATIENT_CLINIC_OR_DEPARTMENT_OTHER): Payer: Medicare Other | Admitting: Radiology

## 2022-10-17 DIAGNOSIS — R519 Headache, unspecified: Secondary | ICD-10-CM | POA: Diagnosis not present

## 2022-10-17 DIAGNOSIS — M19011 Primary osteoarthritis, right shoulder: Secondary | ICD-10-CM | POA: Diagnosis not present

## 2022-10-17 DIAGNOSIS — M25511 Pain in right shoulder: Secondary | ICD-10-CM | POA: Insufficient documentation

## 2022-10-17 DIAGNOSIS — M25562 Pain in left knee: Secondary | ICD-10-CM | POA: Diagnosis not present

## 2022-10-17 DIAGNOSIS — Z043 Encounter for examination and observation following other accident: Secondary | ICD-10-CM | POA: Diagnosis not present

## 2022-10-17 DIAGNOSIS — I672 Cerebral atherosclerosis: Secondary | ICD-10-CM | POA: Diagnosis not present

## 2022-10-17 DIAGNOSIS — W06XXXA Fall from bed, initial encounter: Secondary | ICD-10-CM | POA: Insufficient documentation

## 2022-10-17 DIAGNOSIS — W19XXXA Unspecified fall, initial encounter: Secondary | ICD-10-CM

## 2022-10-17 DIAGNOSIS — I6529 Occlusion and stenosis of unspecified carotid artery: Secondary | ICD-10-CM | POA: Diagnosis not present

## 2022-10-17 DIAGNOSIS — Y92003 Bedroom of unspecified non-institutional (private) residence as the place of occurrence of the external cause: Secondary | ICD-10-CM | POA: Diagnosis not present

## 2022-10-17 DIAGNOSIS — M25512 Pain in left shoulder: Secondary | ICD-10-CM | POA: Diagnosis not present

## 2022-10-17 DIAGNOSIS — R42 Dizziness and giddiness: Secondary | ICD-10-CM | POA: Diagnosis not present

## 2022-10-17 DIAGNOSIS — E1165 Type 2 diabetes mellitus with hyperglycemia: Secondary | ICD-10-CM | POA: Diagnosis not present

## 2022-10-17 NOTE — ED Provider Notes (Signed)
[place] EMERGENCY DEPARTMENT AT [facility] Provider Note   CSN: [id number] Arrival date & time: 10/17/22  0014     History  Chief Complaint  Patient presents with   Christian Morgan is a 82 y.o. male.  The history is provided by a relative and the spouse. The history is limited by the condition of the patient.  Fall This is a new problem. The current episode started less than 1 hour ago. The problem occurs rarely. The problem has been resolved. Pertinent negatives include no chest pain, no abdominal pain, no headaches and no shortness of breath. Nothing aggravates the symptoms. Nothing relieves the symptoms. He has tried nothing for the symptoms. The treatment provided no relief.  Patient with a movement disorder on Sinemet got up from bed and had an unwitnessed fall.  Larey Seat forward reportedly striking right shoulder.  Unclear if struck head,  no blood thinners.  No weakness no numbness no dizziness.       Home Medications Prior to Admission medications   Medication Sig Start Date End Date Taking? Authorizing Provider  acetaminophen (TYLENOL) 500 MG tablet Take 500 mg by mouth every 6 (six) hours as needed for moderate pain or headache.    [provider]  carbidopa-levodopa (SINEMET IR) 25-100 MG tablet TAKE 1 TABLET BY MOUTH THREE TIMES A DAY 07/31/22   Tat, Rebecca S, DO  donepezil (ARICEPT) 10 MG tablet TAKE 1 TABLET BY MOUTH EVERYDAY AT BEDTIME Patient taking differently: Take 10 mg by mouth at bedtime. 07/28/22   Tat, Octaviano Batty, DO  escitalopram (LEXAPRO) 10 MG tablet Take 5 mg by mouth daily.  12/08/19   [provider]  feeding supplement (ENSURE ENLIVE / ENSURE PLUS) LIQD Take 237 mLs by mouth 3 (three) times daily between meals. 08/31/22   Barnetta Chapel, MD  Ipratropium-Albuterol (COMBIVENT) 20-100 MCG/ACT AERS respimat Inhale 1 puff into the lungs every 6 (six) hours as needed for wheezing or shortness of breath. 08/31/22   Barnetta Chapel, MD  levocetirizine (XYZAL) 5 MG tablet Take 5 mg by mouth daily as needed for allergies.    [provider]  losartan (COZAAR) 100 MG tablet Take 100 mg by mouth daily.    [provider]  metFORMIN (GLUCOPHAGE-XR) 500 MG 24 hr tablet Take 1,000 mg by mouth 2 (two) times daily.    [provider]  Multiple Vitamin (MULTIVITAMIN WITH MINERALS) TABS tablet Take 1 tablet by mouth daily. 09/01/22 10/31/22  Berton Mount I, MD  rosuvastatin (CRESTOR) 5 MG tablet Take 5 mg by mouth every evening.    [provider]  vitamin B-12 (CYANOCOBALAMIN) 1000 MCG tablet Take 1,000 mcg by mouth daily.    [provider]      Allergies    Lipitor [atorvastatin], Penicillins, Amitriptyline hcl, Cyclobenzaprine, Gabapentin, Other, Zetia [ezetimibe], Aspirin, Doxycycline, Iodinated contrast media, and Levofloxacin    Review of Systems   Review of Systems  Constitutional:  Negative for fever.  Respiratory:  Negative for shortness of breath.   Cardiovascular:  Negative for chest pain.  Gastrointestinal:  Negative for abdominal pain.  Neurological:  Negative for headaches.  All other systems reviewed and are negative.   Physical Exam Updated Vital Signs BP 130/68   Pulse 74   Temp 98 F (36.7 C) (Oral)   Resp 18   Wt 99 kg   SpO2 100%   BMI 35.23 kg/m  Physical Exam Vitals and nursing  note reviewed.  Constitutional:      General: He is not in acute distress.    Appearance: Normal appearance. He is well-developed. He is not diaphoretic.  HENT:     Head: Normocephalic and atraumatic.     Nose: Nose normal.     Mouth/Throat:     Mouth: Mucous membranes are moist.     Pharynx: Oropharynx is clear.  Eyes:     Extraocular Movements: Extraocular movements intact.     Conjunctiva/sclera: Conjunctivae normal.     Pupils: Pupils are equal, round, and reactive to light.  Cardiovascular:     Rate and Rhythm: Normal rate and regular rhythm.      Pulses: Normal pulses.     Heart sounds: Normal heart sounds.  Pulmonary:     Effort: Pulmonary effort is normal.     Breath sounds: Normal breath sounds. No wheezing or rales.  Abdominal:     General: Bowel sounds are normal.     Palpations: Abdomen is soft.     Tenderness: There is no abdominal tenderness. There is no guarding or rebound.  Musculoskeletal:        General: Normal range of motion.     Left shoulder: Normal.     Left upper arm: Normal.     Right wrist: Normal.     Left wrist: Normal.     Right hand: Normal.     Left hand: Normal.     Cervical back: Normal range of motion and neck supple. No tenderness.     Right knee: Normal. No LCL laxity, MCL laxity, ACL laxity or PCL laxity.     Instability Tests: Anterior drawer test negative. Posterior drawer test negative. Medial McMurray test negative and lateral McMurray test negative.     Left knee: Normal. No LCL laxity, MCL laxity, ACL laxity or PCL laxity.    Instability Tests: Anterior drawer test negative. Posterior drawer test negative. Medial McMurray test negative and lateral McMurray test negative.     Right ankle: Normal.     Right Achilles Tendon: Normal.     Left ankle: Normal.     Left Achilles Tendon: Normal.     Comments: Negative Neers test L shoulder   Skin:    General: Skin is warm and dry.  Neurological:     Mental Status: He is alert and oriented to person, place, and time.     ED Results / Procedures / Treatments   Labs (all labs ordered are listed, but only abnormal results are displayed) Labs Reviewed - No data to display  EKG None  Radiology DG Shoulder Right  Result Date: 10/17/2022 CLINICAL DATA:  Status post fall. EXAM: RIGHT SHOULDER - 2+ VIEW COMPARISON:  None Available. FINDINGS: There is no evidence of fracture or dislocation. Mild to moderate severity degenerative changes are seen involving the right acromioclavicular joint and right glenohumeral articulation. Soft tissues are  unremarkable. IMPRESSION: Degenerative changes without evidence of an acute osseous abnormality. Electronically Signed   By: Aram Candela M.D.   On: 10/17/2022 01:42   DG Chest 2 View  Result Date: 10/17/2022 CLINICAL DATA:  Dizziness, status post fall. EXAM: CHEST - 2 VIEW COMPARISON:  October 07, 2022 FINDINGS: The heart size and mediastinal contours are within normal limits. Both lungs are clear. Multilevel degenerative changes are seen throughout the thoracic spine. IMPRESSION: No active cardiopulmonary disease. Electronically Signed   By: Aram Candela M.D.   On: 10/17/2022 01:41   CT Head Wo Contrast  Result Date: 10/17/2022 CLINICAL DATA:  Mechanical fall from tripping after getting out of bed. Right shoulder and knee pain. EXAM: CT HEAD WITHOUT CONTRAST CT CERVICAL SPINE WITHOUT CONTRAST TECHNIQUE: Multidetector CT imaging of the head and cervical spine was performed following the standard protocol without intravenous contrast. Multiplanar CT image reconstructions of the cervical spine were also generated. RADIATION DOSE REDUCTION: This exam was performed according to the departmental dose-optimization program which includes automated exposure control, adjustment of the mA and/or kV according to patient size and/or use of iterative reconstruction technique. COMPARISON:  Cervical spine radiographs 04/09/2005 and CT head 08/27/2022 FINDINGS: CT HEAD FINDINGS Brain: No intracranial hemorrhage, mass effect, or evidence of acute infarct. No hydrocephalus. No extra-axial fluid collection. Generalized cerebral atrophy. Ill-defined hypoattenuation within the cerebral white matter is nonspecific but consistent with chronic small vessel ischemic disease. Vascular: No hyperdense vessel. Intracranial arterial calcification. Skull: No fracture or focal lesion.  Left craniotomy. Sinuses/Orbits: No acute finding. Mild mucosal thickening right maxillary sinus. Other: None. CT CERVICAL SPINE FINDINGS  Alignment: No evidence of traumatic malalignment. Skull base and vertebrae: No acute fracture. No primary bone lesion or focal pathologic process. Soft tissues and spinal canal: No prevertebral fluid or swelling. No visible canal hematoma. Disc levels: Multilevel spondylosis, disc space height loss, and degenerative endplate changes greatest at C5-C6 where it is advanced. Upper chest: No acute abnormality. Other: Carotid calcification. IMPRESSION: 1. No acute intracranial abnormality. 2. No acute fracture in the cervical spine. Electronically Signed   By: Minerva Fester M.D.   On: 10/17/2022 01:41   CT Cervical Spine Wo Contrast  Result Date: 10/17/2022 CLINICAL DATA:  Mechanical fall from tripping after getting out of bed. Right shoulder and knee pain. EXAM: CT HEAD WITHOUT CONTRAST CT CERVICAL SPINE WITHOUT CONTRAST TECHNIQUE: Multidetector CT imaging of the head and cervical spine was performed following the standard protocol without intravenous contrast. Multiplanar CT image reconstructions of the cervical spine were also generated. RADIATION DOSE REDUCTION: This exam was performed according to the departmental dose-optimization program which includes automated exposure control, adjustment of the mA and/or kV according to patient size and/or use of iterative reconstruction technique. COMPARISON:  Cervical spine radiographs 04/09/2005 and CT head 08/27/2022 FINDINGS: CT HEAD FINDINGS Brain: No intracranial hemorrhage, mass effect, or evidence of acute infarct. No hydrocephalus. No extra-axial fluid collection. Generalized cerebral atrophy. Ill-defined hypoattenuation within the cerebral white matter is nonspecific but consistent with chronic small vessel ischemic disease. Vascular: No hyperdense vessel. Intracranial arterial calcification. Skull: No fracture or focal lesion.  Left craniotomy. Sinuses/Orbits: No acute finding. Mild mucosal thickening right maxillary sinus. Other: None. CT CERVICAL SPINE  FINDINGS Alignment: No evidence of traumatic malalignment. Skull base and vertebrae: No acute fracture. No primary bone lesion or focal pathologic process. Soft tissues and spinal canal: No prevertebral fluid or swelling. No visible canal hematoma. Disc levels: Multilevel spondylosis, disc space height loss, and degenerative endplate changes greatest at C5-C6 where it is advanced. Upper chest: No acute abnormality. Other: Carotid calcification. IMPRESSION: 1. No acute intracranial abnormality. 2. No acute fracture in the cervical spine. Electronically Signed   By: Minerva Fester M.D.   On: 10/17/2022 01:41    Procedures Procedures    Medications Ordered in ED Medications - No data to display  ED Course/ Medical Decision Making/ A&P  Medical Decision Making Patient had unwitnessed fall forward at home.  Left shoulder pain   Amount and/or Complexity of Data Reviewed Independent Historian: spouse    Details: Son and wife seen above  Radiology: ordered and independent interpretation performed.    Details: Negative CT head, negative CXR by me   Risk Risk Details: Well appearing, FROM of the joints.  Imaging and exam are benign and reassuring.  Stable for discharge.  Strict return.      Final Clinical Impression(s) / ED Diagnoses Final diagnoses:  Fall, initial encounter   Return for intractable cough, coughing up blood, fevers > 100.4 unrelieved by medication, shortness of breath, intractable vomiting, chest pain, shortness of breath, weakness, numbness, changes in speech, facial asymmetry, abdominal pain, passing out, Inability to tolerate liquids or food, cough, altered mental status or any concerns. No signs of systemic illness or infection. The patient is nontoxic-appearing on exam and vital signs are within normal limits.  I have reviewed the triage vital signs and the nursing notes. Pertinent labs & imaging results that were available during my care of  the patient were reviewed by me and considered in my medical decision making (see chart for details). After history, exam, and medical workup I feel the patient has been appropriately medically screened and is safe for discharge home. Pertinent diagnoses were discussed with the patient. Patient was given return precautions.    Rx / DC Orders ED Discharge Orders     None         Aneesh Faller, MD 10/17/22 1610

## 2022-10-17 NOTE — ED Triage Notes (Signed)
Presents for mechanical fall from tripping after getting out of bed. Denies dizziness, Endorses R shoulder pain and R knee pain. Able to transfer self to bed from chair with cane (baseline). Not on a blood thinner. Takes carbidopa-levodopa, has not be officially diagnosed with Parkinson's.

## 2022-10-17 NOTE — ED Notes (Signed)
Returned from imaging

## 2022-10-19 DIAGNOSIS — G20A1 Parkinson's disease without dyskinesia, without mention of fluctuations: Secondary | ICD-10-CM | POA: Diagnosis not present

## 2022-10-19 DIAGNOSIS — U071 COVID-19: Secondary | ICD-10-CM | POA: Diagnosis not present

## 2022-10-19 DIAGNOSIS — G309 Alzheimer's disease, unspecified: Secondary | ICD-10-CM | POA: Diagnosis not present

## 2022-10-19 DIAGNOSIS — Z9181 History of falling: Secondary | ICD-10-CM | POA: Diagnosis not present

## 2022-10-19 DIAGNOSIS — E538 Deficiency of other specified B group vitamins: Secondary | ICD-10-CM | POA: Diagnosis not present

## 2022-10-21 DIAGNOSIS — G309 Alzheimer's disease, unspecified: Secondary | ICD-10-CM | POA: Diagnosis not present

## 2022-10-21 DIAGNOSIS — E538 Deficiency of other specified B group vitamins: Secondary | ICD-10-CM | POA: Diagnosis not present

## 2022-10-21 DIAGNOSIS — Z9181 History of falling: Secondary | ICD-10-CM | POA: Diagnosis not present

## 2022-10-21 DIAGNOSIS — G20A1 Parkinson's disease without dyskinesia, without mention of fluctuations: Secondary | ICD-10-CM | POA: Diagnosis not present

## 2022-10-21 DIAGNOSIS — U071 COVID-19: Secondary | ICD-10-CM | POA: Diagnosis not present

## 2022-10-22 ENCOUNTER — Other Ambulatory Visit: Payer: Self-pay | Admitting: Neurology

## 2022-10-22 DIAGNOSIS — R4182 Altered mental status, unspecified: Secondary | ICD-10-CM

## 2022-10-22 DIAGNOSIS — F0390 Unspecified dementia without behavioral disturbance: Secondary | ICD-10-CM

## 2022-10-22 DIAGNOSIS — G20A1 Parkinson's disease without dyskinesia, without mention of fluctuations: Secondary | ICD-10-CM | POA: Diagnosis not present

## 2022-10-22 DIAGNOSIS — Z9181 History of falling: Secondary | ICD-10-CM | POA: Diagnosis not present

## 2022-10-22 DIAGNOSIS — U071 COVID-19: Secondary | ICD-10-CM | POA: Diagnosis not present

## 2022-10-22 DIAGNOSIS — G309 Alzheimer's disease, unspecified: Secondary | ICD-10-CM | POA: Diagnosis not present

## 2022-10-22 DIAGNOSIS — E538 Deficiency of other specified B group vitamins: Secondary | ICD-10-CM | POA: Diagnosis not present

## 2022-10-26 DIAGNOSIS — Z9181 History of falling: Secondary | ICD-10-CM | POA: Diagnosis not present

## 2022-10-26 DIAGNOSIS — G20A1 Parkinson's disease without dyskinesia, without mention of fluctuations: Secondary | ICD-10-CM | POA: Diagnosis not present

## 2022-10-26 DIAGNOSIS — G309 Alzheimer's disease, unspecified: Secondary | ICD-10-CM | POA: Diagnosis not present

## 2022-10-26 DIAGNOSIS — E538 Deficiency of other specified B group vitamins: Secondary | ICD-10-CM | POA: Diagnosis not present

## 2022-10-26 DIAGNOSIS — U071 COVID-19: Secondary | ICD-10-CM | POA: Diagnosis not present

## 2022-10-28 ENCOUNTER — Other Ambulatory Visit: Payer: Self-pay | Admitting: Neurology

## 2022-10-28 DIAGNOSIS — G20A1 Parkinson's disease without dyskinesia, without mention of fluctuations: Secondary | ICD-10-CM

## 2022-10-29 ENCOUNTER — Encounter: Payer: Self-pay | Admitting: Podiatry

## 2022-10-29 ENCOUNTER — Ambulatory Visit: Payer: Medicare Other | Admitting: Podiatry

## 2022-10-29 DIAGNOSIS — Z9181 History of falling: Secondary | ICD-10-CM | POA: Diagnosis not present

## 2022-10-29 DIAGNOSIS — B351 Tinea unguium: Secondary | ICD-10-CM | POA: Diagnosis not present

## 2022-10-29 DIAGNOSIS — U071 COVID-19: Secondary | ICD-10-CM | POA: Diagnosis not present

## 2022-10-29 DIAGNOSIS — E538 Deficiency of other specified B group vitamins: Secondary | ICD-10-CM | POA: Diagnosis not present

## 2022-10-29 DIAGNOSIS — E119 Type 2 diabetes mellitus without complications: Secondary | ICD-10-CM

## 2022-10-29 DIAGNOSIS — G20A1 Parkinson's disease without dyskinesia, without mention of fluctuations: Secondary | ICD-10-CM | POA: Diagnosis not present

## 2022-10-29 DIAGNOSIS — M79675 Pain in left toe(s): Secondary | ICD-10-CM | POA: Diagnosis not present

## 2022-10-29 DIAGNOSIS — M79674 Pain in right toe(s): Secondary | ICD-10-CM | POA: Diagnosis not present

## 2022-10-29 DIAGNOSIS — G309 Alzheimer's disease, unspecified: Secondary | ICD-10-CM | POA: Diagnosis not present

## 2022-10-29 NOTE — Progress Notes (Signed)
This patient returns to my office for at risk foot care.  This patient requires this care by a professional since this patient will be at risk due to having type 1 diabetes. He presents to the office with his son.  This patient is unable to cut nails himself since the patient cannot reach his nails.These nails are painful walking and wearing shoes.  This patient presents for at risk foot care today.  General Appearance  Alert, conversant and in no acute stress.  Vascular  Dorsalis pedis and posterior tibial  pulses are palpable  Right.  Weakly palpable pulses left foot..  Capillary return is within normal limits  bilaterally. Temperature is within normal limits  bilaterally.  Neurologic  Senn-Weinstein monofilament wire test within normal limits  bilaterally. Muscle power within normal limits bilaterally.  Nails Thick disfigured discolored nails with subungual debris  from hallux to fifth toes bilaterally. No evidence of bacterial infection or drainage bilaterally.  Orthopedic  No limitations of motion  feet .  No crepitus or effusions noted.  No bony pathology or digital deformities noted.  Skin  normotropic skin with no porokeratosis noted bilaterally.  No signs of infections or ulcers noted.     Onychomycosis  Pain in right toes  Pain in left toes  Consent was obtained for treatment procedures.   Mechanical debridement of nails 1-5  bilaterally performed with a nail nipper.  Filed with dremel without incident.    Return office visit 3 months                     Told patient to return for periodic foot care and evaluation due to potential at risk complications.   Gardiner Barefoot DPM

## 2022-10-31 ENCOUNTER — Emergency Department (HOSPITAL_COMMUNITY)
Admission: EM | Admit: 2022-10-31 | Discharge: 2022-10-31 | Disposition: A | Discharge status: Home / Self Care | Payer: Medicare Other | Attending: Emergency Medicine | Admitting: Emergency Medicine

## 2022-10-31 ENCOUNTER — Other Ambulatory Visit: Payer: Self-pay

## 2022-10-31 ENCOUNTER — Encounter (HOSPITAL_COMMUNITY): Payer: Self-pay

## 2022-10-31 ENCOUNTER — Emergency Department (HOSPITAL_COMMUNITY): Payer: Medicare Other

## 2022-10-31 DIAGNOSIS — G20A1 Parkinson's disease without dyskinesia, without mention of fluctuations: Secondary | ICD-10-CM | POA: Diagnosis not present

## 2022-10-31 DIAGNOSIS — R404 Transient alteration of awareness: Secondary | ICD-10-CM | POA: Insufficient documentation

## 2022-10-31 DIAGNOSIS — Z79899 Other long term (current) drug therapy: Secondary | ICD-10-CM | POA: Diagnosis not present

## 2022-10-31 DIAGNOSIS — Z7984 Long term (current) use of oral hypoglycemic drugs: Secondary | ICD-10-CM | POA: Diagnosis not present

## 2022-10-31 DIAGNOSIS — R7309 Other abnormal glucose: Secondary | ICD-10-CM | POA: Diagnosis not present

## 2022-10-31 LAB — HEPATIC FUNCTION PANEL
ALT: 6 U/L (ref 0–44)
AST: 23 U/L (ref 15–41)
Albumin: 3.5 g/dL (ref 3.5–5.0)
Alkaline Phosphatase: 48 U/L (ref 38–126)
Bilirubin, Direct: 0.3 mg/dL — ABNORMAL HIGH (ref 0.0–0.2)
Indirect Bilirubin: 0.5 mg/dL (ref 0.3–0.9)
Total Bilirubin: 0.8 mg/dL (ref 0.3–1.2)
Total Protein: 6.7 g/dL (ref 6.5–8.1)

## 2022-10-31 LAB — CBC WITH DIFFERENTIAL/PLATELET
Abs Immature Granulocytes: 0.04 10*3/uL (ref 0.00–0.07)
Basophils Absolute: 0.1 10*3/uL (ref 0.0–0.1)
Basophils Relative: 1 %
Eosinophils Absolute: 0.4 10*3/uL (ref 0.0–0.5)
Eosinophils Relative: 4 %
HCT: 35.7 % — ABNORMAL LOW (ref 39.0–52.0)
Hemoglobin: 11.7 g/dL — ABNORMAL LOW (ref 13.0–17.0)
Immature Granulocytes: 0 %
Lymphocytes Relative: 40 %
Lymphs Abs: 4 10*3/uL (ref 0.7–4.0)
MCH: 30.5 pg (ref 26.0–34.0)
MCHC: 32.8 g/dL (ref 30.0–36.0)
MCV: 93.2 fL (ref 80.0–100.0)
Monocytes Absolute: 0.9 10*3/uL (ref 0.1–1.0)
Monocytes Relative: 9 %
Neutro Abs: 4.5 10*3/uL (ref 1.7–7.7)
Neutrophils Relative %: 46 %
Platelets: 260 10*3/uL (ref 150–400)
RBC: 3.83 MIL/uL — ABNORMAL LOW (ref 4.22–5.81)
RDW: 13.9 % (ref 11.5–15.5)
WBC: 9.9 10*3/uL (ref 4.0–10.5)
nRBC: 0 % (ref 0.0–0.2)

## 2022-10-31 LAB — BASIC METABOLIC PANEL
Anion gap: 8 (ref 5–15)
BUN: 28 mg/dL — ABNORMAL HIGH (ref 8–23)
CO2: 25 mmol/L (ref 22–32)
Calcium: 9.3 mg/dL (ref 8.9–10.3)
Chloride: 101 mmol/L (ref 98–111)
Creatinine, Ser: 1.19 mg/dL (ref 0.61–1.24)
GFR, Estimated: >60 mL/min (ref >60)
Glucose, Bld: 97 mg/dL (ref 70–99)
Potassium: 3.5 mmol/L (ref 3.5–5.1)
Sodium: 134 mmol/L — ABNORMAL LOW (ref 135–145)

## 2022-10-31 LAB — URINALYSIS, ROUTINE W REFLEX MICROSCOPIC
Bilirubin Urine: NEGATIVE
Glucose, UA: NEGATIVE mg/dL
Hgb urine dipstick: NEGATIVE
Ketones, ur: 5 mg/dL — AB
Leukocytes,Ua: NEGATIVE
Nitrite: NEGATIVE
Protein, ur: NEGATIVE mg/dL
Specific Gravity, Urine: 1.021 (ref 1.005–1.030)
pH: 5 (ref 5.0–8.0)

## 2022-10-31 LAB — CBG MONITORING, ED: Glucose-Capillary: 101 mg/dL — ABNORMAL HIGH (ref 70–99)

## 2022-10-31 LAB — LIPASE, BLOOD: Lipase: 33 U/L (ref 11–51)

## 2022-10-31 LAB — AMMONIA: Ammonia: 12 umol/L (ref 9–35)

## 2022-10-31 NOTE — ED Triage Notes (Signed)
Pt c/o nausea and HA started today.

## 2022-10-31 NOTE — ED Provider Notes (Signed)
[place] EMERGENCY DEPARTMENT AT [facility] Provider Note   CSN: [id number] Arrival date & time: 10/31/22  8413     History  Chief Complaint  Patient presents with   Headache    Christian Morgan is a 82 y.o. male.  The history is provided by the patient, a relative and medical records. No language interpreter was used.  Altered Mental Status Presenting symptoms: confusion   Severity:  Moderate Most recent episode:  Today Episode history:  Continuous Timing:  Sporadic Progression:  Resolved Associated symptoms: headaches   Associated symptoms: no abdominal pain (resolved now but reported some to family earlier), no agitation, no fever, no light-headedness, no nausea, no palpitations, no seizures, no visual change, no vomiting and no weakness        Home Medications Prior to Admission medications   Medication Sig Start Date End Date Taking? Authorizing Provider  acetaminophen (TYLENOL) 500 MG tablet Take 500 mg by mouth every 6 (six) hours as needed for moderate pain or headache.    [provider]  carbidopa-levodopa (SINEMET IR) 25-100 MG tablet TAKE 1 TABLET BY MOUTH THREE TIMES A DAY 10/28/22   Tat, Rebecca S, DO  donepezil (ARICEPT) 10 MG tablet Take 1 tablet (10 mg total) by mouth at bedtime. 10/22/22   Tat, Octaviano Batty, DO  escitalopram (LEXAPRO) 10 MG tablet Take 5 mg by mouth daily.  12/08/19   [provider]  feeding supplement (ENSURE ENLIVE / ENSURE PLUS) LIQD Take 237 mLs by mouth 3 (three) times daily between meals. 08/31/22   Barnetta Chapel, MD  Ipratropium-Albuterol (COMBIVENT) 20-100 MCG/ACT AERS respimat Inhale 1 puff into the lungs every 6 (six) hours as needed for wheezing or shortness of breath. 08/31/22   Barnetta Chapel, MD  levocetirizine (XYZAL) 5 MG tablet Take 5 mg by mouth daily as needed for allergies.    [provider]  losartan (COZAAR) 100 MG tablet Take 100 mg by mouth daily.    [provider]  metFORMIN (GLUCOPHAGE-XR) 500 MG 24 hr tablet Take 1,000 mg by mouth 2 (two) times daily.    [provider]  Multiple Vitamin (MULTIVITAMIN WITH MINERALS) TABS tablet Take 1 tablet by mouth daily. 09/01/22 10/31/22  Berton Mount I, MD  rosuvastatin (CRESTOR) 5 MG tablet Take 5 mg by mouth every evening.    [provider]  vitamin B-12 (CYANOCOBALAMIN) 1000 MCG tablet Take 1,000 mcg by mouth daily.    [provider]      Allergies    Lipitor [atorvastatin], Penicillins, Amitriptyline hcl, Cyclobenzaprine, Gabapentin, Other, Zetia [ezetimibe], Aspirin, Doxycycline, Iodinated contrast media, and Levofloxacin    Review of Systems   Review of Systems  Constitutional:  Positive for fatigue (felt ill). Negative for chills, diaphoresis and fever.  HENT:  Negative for congestion.   Respiratory:  Negative for cough, chest tightness, shortness of breath and wheezing.   Cardiovascular:  Negative for chest pain and palpitations.  Gastrointestinal:  Negative for abdominal pain (resolved now but reported some to family earlier), constipation, diarrhea, nausea and vomiting.  Genitourinary:  Negative for dysuria and frequency.  Musculoskeletal:  Negative for back pain and neck pain.  Neurological:  Positive for headaches. Negative for dizziness, seizures, weakness, light-headedness and numbness.  Psychiatric/Behavioral:  Positive for confusion. Negative for agitation.   All other systems reviewed and are negative.   Physical Exam Updated Vital Signs BP (!) 143/70 (BP Location: Right Arm)   Pulse 65  Temp (!) 97.4 F (36.3 C) (Oral)   Resp 16   Ht 5\' 6"  (1.676 m)   Wt 99 kg   SpO2 98%   BMI 35.23 kg/m  Physical Exam Vitals and nursing note reviewed.  Constitutional:      General: He is not in acute distress.    Appearance: He is well-developed. He is not ill-appearing, toxic-appearing or diaphoretic.  HENT:     Head: Normocephalic and atraumatic.      Nose: No congestion or rhinorrhea.     Mouth/Throat:     Mouth: Mucous membranes are moist.     Pharynx: No oropharyngeal exudate or posterior oropharyngeal erythema.  Eyes:     Extraocular Movements: Extraocular movements intact.     Conjunctiva/sclera: Conjunctivae normal.     Pupils: Pupils are equal, round, and reactive to light.  Cardiovascular:     Rate and Rhythm: Normal rate and regular rhythm.     Pulses: Normal pulses.     Heart sounds: Murmur heard.  Pulmonary:     Effort: Pulmonary effort is normal. No respiratory distress.     Breath sounds: Normal breath sounds. No wheezing, rhonchi or rales.  Chest:     Chest wall: No tenderness.  Abdominal:     General: Abdomen is flat. There is no distension.     Palpations: Abdomen is soft.     Tenderness: There is no abdominal tenderness. There is no right CVA tenderness, left CVA tenderness, guarding or rebound.  Musculoskeletal:        General: No swelling or tenderness.     Cervical back: Neck supple. No tenderness.     Right lower leg: No edema.     Left lower leg: No edema.  Skin:    General: Skin is warm and dry.     Capillary Refill: Capillary refill takes less than 2 seconds.     Findings: No erythema or rash.  Neurological:     General: No focal deficit present.     Mental Status: He is alert. Mental status is at baseline.     Cranial Nerves: No cranial nerve deficit.     Sensory: No sensory deficit.     Motor: No weakness.  Psychiatric:        Mood and Affect: Mood normal.     ED Results / Procedures / Treatments   Labs (all labs ordered are listed, but only abnormal results are displayed) Labs Reviewed  BASIC METABOLIC PANEL - Abnormal; Notable for the following components:      Result Value   Sodium 134 (*)    BUN 28 (*)    All other components within normal limits  CBC WITH DIFFERENTIAL/PLATELET - Abnormal; Notable for the following components:   RBC 3.83 (*)    Hemoglobin 11.7 (*)    HCT 35.7 (*)     All other components within normal limits  URINALYSIS, ROUTINE W REFLEX MICROSCOPIC - Abnormal; Notable for the following components:   Ketones, ur 5 (*)    All other components within normal limits  HEPATIC FUNCTION PANEL - Abnormal; Notable for the following components:   Bilirubin, Direct 0.3 (*)    All other components within normal limits  CBG MONITORING, ED - Abnormal; Notable for the following components:   Glucose-Capillary 101 (*)    All other components within normal limits  AMMONIA  LIPASE, BLOOD    EKG None  Radiology DG Chest 2 View  Result Date: 10/31/2022 CLINICAL DATA:  Labile  glucose levels. Altered mental status. Rule out infection. EXAM: CHEST - 2 VIEW COMPARISON:  10/17/2022 FINDINGS: Numerous leads and wires project over the chest. Midline trachea. Normal heart size for level of inspiration. No pleural effusion or pneumothorax. The chin overlies the apices minimally. Minimal volume loss and scarring or subsegmental atelectasis in the lung bases. No lobar consolidation. IMPRESSION: No acute cardiopulmonary disease. Electronically Signed   By: Jeronimo Greaves M.D.   On: 10/31/2022 11:49    Procedures Procedures    Medications Ordered in ED Medications - No data to display  ED Course/ Medical Decision Making/ A&P                                 Medical Decision Making Amount and/or Complexity of Data Reviewed Labs: ordered. Radiology: ordered.    Lamarr Judie Petit Reinhold Atkerson is a 82 y.o. male With a past medical history significant for hypertension, hyperlipidemia, diabetes, sleep apnea, Parkinson's disease, and dementia who presents for labile blood glucoses, altered mental status with "not feeling right", some headache, some nausea, and some resolved abdominal discomfort.  According to family who arrived with patient, patient woke this morning and was "not feeling right".  He had some varying description of symptoms with some headache that resolved and then some  abdominal discomfort that resolved.  Patient is now reporting no complaints and feels back to normal.  He was denying any fevers, chills, congestion or cough.  He denied vomiting but had some nausea earlier.  He denies any constipation, diarrhea, or urinary changes but family is concerned for possible UTI given the labile glucoses.  They report his glucose was between 101 90s today which is more variable than it normally is for him.  Son reports that when his glucose starts to rise closer to 200, he gets more confused as he was acting earlier today.  On exam, lungs were clear.  Chest nontender.  Abdomen nontender.  Patient moving all extremities.  No focal neurologic deficits initially aside from a slight resting tremor.  He does have a murmur.  Chest and abdomen nontender.  Pupils symmetric and reactive with normal extract movements.  No evidence of acute trauma.  Patient otherwise resting without complaints.  We had a shared decision-making conversation.  Given the patient's glucose variability, we will get some labs and workup to rule out occult infection.  Will get chest x-ray and urinalysis.  His other labs initially are reassuring but we will check lipase and hepatic function given his abdominal discomfort earlier.  If workup is reassuring, anticipate likely discharge home for outpatient follow-up.  Workup continues to return reassuring.  Patient has no further symptoms and would like to go home.  We discussed following with PCP to discuss medication titration and management and making sure he is staying hydrated and eating well.  Family and patient agree.  Patient discharged in good condition after otherwise reassuring workup.         Final Clinical Impression(s) / ED Diagnoses Final diagnoses:  Labile blood glucose  Altered awareness, transient    Rx / DC Orders ED Discharge Orders     None      Clinical Impression: 1. Labile blood glucose   2. Altered awareness, transient      Disposition: Discharge  Condition: Good  I have discussed the results, Dx and Tx plan with the pt(& family if present). He/she/they expressed understanding and agree(s) with the  plan. Discharge instructions discussed at great length. Strict return precautions discussed and pt &/or family have verbalized understanding of the instructions. No further questions at time of discharge.    Discharge Medication List as of 10/31/2022  1:53 PM      Follow Up: [name], MD [address] [place] [address] [phone]     [facility] Emergency Department at [facility] [address] [place] [address] [phone]        Monice Lundy, Canary Brim, MD 10/31/22 1435

## 2022-10-31 NOTE — Discharge Instructions (Signed)
Your history, exam and workup today did not reveal evidence of acute infection or other critical abnormalities.  I do suspect that some of the symptoms and altered mental status earlier were related to fluctuating glucose levels.  His glucose has been more stable here and given his stability for over 4 hours we feel he is safe for discharge home.  Please follow-up with his primary doctor and make sure he is eating and drinking well.  If any symptoms change or worsen acutely, please return to the nearest emergency department.

## 2022-11-03 DIAGNOSIS — G309 Alzheimer's disease, unspecified: Secondary | ICD-10-CM | POA: Diagnosis not present

## 2022-11-03 DIAGNOSIS — U071 COVID-19: Secondary | ICD-10-CM | POA: Diagnosis not present

## 2022-11-03 DIAGNOSIS — E538 Deficiency of other specified B group vitamins: Secondary | ICD-10-CM | POA: Diagnosis not present

## 2022-11-03 DIAGNOSIS — G20A1 Parkinson's disease without dyskinesia, without mention of fluctuations: Secondary | ICD-10-CM | POA: Diagnosis not present

## 2022-11-03 DIAGNOSIS — Z9181 History of falling: Secondary | ICD-10-CM | POA: Diagnosis not present

## 2022-11-04 DIAGNOSIS — G20A1 Parkinson's disease without dyskinesia, without mention of fluctuations: Secondary | ICD-10-CM | POA: Diagnosis not present

## 2022-11-04 DIAGNOSIS — U071 COVID-19: Secondary | ICD-10-CM | POA: Diagnosis not present

## 2022-11-04 DIAGNOSIS — Z9181 History of falling: Secondary | ICD-10-CM | POA: Diagnosis not present

## 2022-11-04 DIAGNOSIS — G309 Alzheimer's disease, unspecified: Secondary | ICD-10-CM | POA: Diagnosis not present

## 2022-11-04 DIAGNOSIS — E538 Deficiency of other specified B group vitamins: Secondary | ICD-10-CM | POA: Diagnosis not present

## 2022-11-09 ENCOUNTER — Ambulatory Visit: Payer: Medicare Other | Admitting: Podiatry

## 2022-11-11 DIAGNOSIS — U071 COVID-19: Secondary | ICD-10-CM | POA: Diagnosis not present

## 2022-11-11 DIAGNOSIS — G309 Alzheimer's disease, unspecified: Secondary | ICD-10-CM | POA: Diagnosis not present

## 2022-11-11 DIAGNOSIS — E538 Deficiency of other specified B group vitamins: Secondary | ICD-10-CM | POA: Diagnosis not present

## 2022-11-11 DIAGNOSIS — Z9181 History of falling: Secondary | ICD-10-CM | POA: Diagnosis not present

## 2022-11-11 DIAGNOSIS — G20A1 Parkinson's disease without dyskinesia, without mention of fluctuations: Secondary | ICD-10-CM | POA: Diagnosis not present

## 2022-11-12 ENCOUNTER — Ambulatory Visit: Payer: Medicare Other | Admitting: Physical Therapy

## 2022-11-12 ENCOUNTER — Ambulatory Visit: Payer: Medicare Other | Admitting: Speech Pathology

## 2022-11-12 ENCOUNTER — Ambulatory Visit: Payer: Medicare Other | Admitting: Occupational Therapy

## 2022-11-12 DIAGNOSIS — G309 Alzheimer's disease, unspecified: Secondary | ICD-10-CM | POA: Diagnosis not present

## 2022-11-12 DIAGNOSIS — G20A1 Parkinson's disease without dyskinesia, without mention of fluctuations: Secondary | ICD-10-CM | POA: Diagnosis not present

## 2022-11-12 DIAGNOSIS — E538 Deficiency of other specified B group vitamins: Secondary | ICD-10-CM | POA: Diagnosis not present

## 2022-11-12 DIAGNOSIS — Z9181 History of falling: Secondary | ICD-10-CM | POA: Diagnosis not present

## 2022-11-12 DIAGNOSIS — U071 COVID-19: Secondary | ICD-10-CM | POA: Diagnosis not present

## 2022-11-19 DIAGNOSIS — E538 Deficiency of other specified B group vitamins: Secondary | ICD-10-CM | POA: Diagnosis not present

## 2022-11-19 DIAGNOSIS — G309 Alzheimer's disease, unspecified: Secondary | ICD-10-CM | POA: Diagnosis not present

## 2022-11-19 DIAGNOSIS — U071 COVID-19: Secondary | ICD-10-CM | POA: Diagnosis not present

## 2022-11-19 DIAGNOSIS — G20A1 Parkinson's disease without dyskinesia, without mention of fluctuations: Secondary | ICD-10-CM | POA: Diagnosis not present

## 2022-11-19 DIAGNOSIS — Z9181 History of falling: Secondary | ICD-10-CM | POA: Diagnosis not present

## 2022-11-29 ENCOUNTER — Encounter (HOSPITAL_COMMUNITY): Payer: Self-pay | Admitting: *Deleted

## 2022-11-29 ENCOUNTER — Other Ambulatory Visit: Payer: Self-pay

## 2022-11-29 ENCOUNTER — Emergency Department (HOSPITAL_COMMUNITY)
Admission: EM | Admit: 2022-11-29 | Discharge: 2022-11-30 | Discharge status: Against Medical Advice | Payer: Medicare Other | Attending: Emergency Medicine | Admitting: Emergency Medicine

## 2022-11-29 DIAGNOSIS — Y92003 Bedroom of unspecified non-institutional (private) residence as the place of occurrence of the external cause: Secondary | ICD-10-CM | POA: Insufficient documentation

## 2022-11-29 DIAGNOSIS — Z5321 Procedure and treatment not carried out due to patient leaving prior to being seen by health care provider: Secondary | ICD-10-CM | POA: Insufficient documentation

## 2022-11-29 DIAGNOSIS — W06XXXA Fall from bed, initial encounter: Secondary | ICD-10-CM | POA: Insufficient documentation

## 2022-11-29 DIAGNOSIS — R52 Pain, unspecified: Secondary | ICD-10-CM | POA: Diagnosis not present

## 2022-11-29 NOTE — ED Notes (Signed)
Pt left AMA. Son states that pt is diabetic and getting antsy and he needs to get him some homemade food as well as his medication at home

## 2022-11-29 NOTE — ED Triage Notes (Signed)
The pt was brought in by his family because the pt fell off the bed    the son just wants him checked no new pain anywhere

## 2022-12-07 DIAGNOSIS — G20A1 Parkinson's disease without dyskinesia, without mention of fluctuations: Secondary | ICD-10-CM | POA: Diagnosis not present

## 2022-12-07 DIAGNOSIS — W19XXXA Unspecified fall, initial encounter: Secondary | ICD-10-CM | POA: Diagnosis not present

## 2022-12-22 DIAGNOSIS — E1169 Type 2 diabetes mellitus with other specified complication: Secondary | ICD-10-CM | POA: Diagnosis not present

## 2022-12-22 DIAGNOSIS — E7800 Pure hypercholesterolemia, unspecified: Secondary | ICD-10-CM | POA: Diagnosis not present

## 2022-12-22 DIAGNOSIS — G20A1 Parkinson's disease without dyskinesia, without mention of fluctuations: Secondary | ICD-10-CM | POA: Diagnosis not present

## 2022-12-22 DIAGNOSIS — I10 Essential (primary) hypertension: Secondary | ICD-10-CM | POA: Diagnosis not present

## 2022-12-22 DIAGNOSIS — Z0000 Encounter for general adult medical examination without abnormal findings: Secondary | ICD-10-CM | POA: Diagnosis not present

## 2022-12-22 DIAGNOSIS — Z23 Encounter for immunization: Secondary | ICD-10-CM | POA: Diagnosis not present

## 2022-12-22 DIAGNOSIS — E119 Type 2 diabetes mellitus without complications: Secondary | ICD-10-CM | POA: Diagnosis not present

## 2023-01-01 DIAGNOSIS — I10 Essential (primary) hypertension: Secondary | ICD-10-CM | POA: Diagnosis not present

## 2023-01-01 DIAGNOSIS — E119 Type 2 diabetes mellitus without complications: Secondary | ICD-10-CM | POA: Diagnosis not present

## 2023-01-01 DIAGNOSIS — H04123 Dry eye syndrome of bilateral lacrimal glands: Secondary | ICD-10-CM | POA: Diagnosis not present

## 2023-01-16 ENCOUNTER — Other Ambulatory Visit: Payer: Self-pay | Admitting: Neurology

## 2023-01-16 DIAGNOSIS — G20A1 Parkinson's disease without dyskinesia, without mention of fluctuations: Secondary | ICD-10-CM

## 2023-01-16 DIAGNOSIS — F0390 Unspecified dementia without behavioral disturbance: Secondary | ICD-10-CM

## 2023-01-16 DIAGNOSIS — R4182 Altered mental status, unspecified: Secondary | ICD-10-CM

## 2023-01-26 ENCOUNTER — Other Ambulatory Visit: Payer: Self-pay | Admitting: Neurology

## 2023-01-26 DIAGNOSIS — G20A1 Parkinson's disease without dyskinesia, without mention of fluctuations: Secondary | ICD-10-CM

## 2023-02-08 ENCOUNTER — Encounter: Payer: Self-pay | Admitting: Podiatry

## 2023-02-08 ENCOUNTER — Ambulatory Visit: Payer: Medicare Other | Admitting: Podiatry

## 2023-02-08 DIAGNOSIS — B351 Tinea unguium: Secondary | ICD-10-CM

## 2023-02-08 DIAGNOSIS — E119 Type 2 diabetes mellitus without complications: Secondary | ICD-10-CM

## 2023-02-08 DIAGNOSIS — M79675 Pain in left toe(s): Secondary | ICD-10-CM | POA: Diagnosis not present

## 2023-02-08 DIAGNOSIS — M79674 Pain in right toe(s): Secondary | ICD-10-CM

## 2023-02-08 NOTE — Progress Notes (Signed)
This patient returns to my office for at risk foot care.  This patient requires this care by a professional since this patient will be at risk due to having type 1 diabetes. He presents to the office with his son.  This patient is unable to cut nails himself since the patient cannot reach his nails.These nails are painful walking and wearing shoes.  This patient presents for at risk foot care today.  General Appearance  Alert, conversant and in no acute stress.  Vascular  Dorsalis pedis and posterior tibial  pulses are palpable  Right.  Weakly palpable pulses left foot..  Capillary return is within normal limits  bilaterally. Temperature is within normal limits  bilaterally.  Neurologic  Senn-Weinstein monofilament wire test within normal limits  bilaterally. Muscle power within normal limits bilaterally.  Nails Thick disfigured discolored nails with subungual debris  from hallux to fifth toes bilaterally. No evidence of bacterial infection or drainage bilaterally.  Orthopedic  No limitations of motion  feet .  No crepitus or effusions noted.  No bony pathology or digital deformities noted.  Skin  normotropic skin with no porokeratosis noted bilaterally.  No signs of infections or ulcers noted.     Onychomycosis  Pain in right toes  Pain in left toes  Consent was obtained for treatment procedures.   Mechanical debridement of nails 1-5  bilaterally performed with a nail nipper.  Filed with dremel without incident.    Return office visit 3 months                     Told patient to return for periodic foot care and evaluation due to potential at risk complications.   Gardiner Barefoot DPM

## 2023-03-09 NOTE — Progress Notes (Signed)
 Assessment/Plan:   1.  Parkinsons Disease  -increase carbidopa /levodopa  25/100, 2 at 8am/2 at noon/1 at 4pm  -doesn't want PT.  Lack of exercise is an issue here and they are looking for some help in the home later in the day  -he is seeing dermatology  2.  Dementia  -Had neurocognitive testing in June, 2021 with evidence of dementia, both parkinsonism and Alzheimer's.  -Continue donepezil , 10 mg daily.  3.  B12 deficiency  -Continue over-the-counter B12  4.  ? TGA  -Patient had an episode in July, 2022 where he had trouble remembering the people around him.  Patient does have baseline dementia, so difficult exactly to say what this was.  CT brain was negative.  Subjective:   Christian Morgan was seen today in follow up for parkinsonism.  My previous records were reviewed prior to todays visit as well as outside records available to me.  Patient is with his son who supplements the history.  Patient was in the emergency room in August after a fall.  Fall was not witnessed.  He wasn't using the cane.  It was a slow fall.  Patient apparently hit his right shoulder.  Emergency room workup was negative.  He went to the emergency room at the end of September with a fall off the bed, but was not seen because of the extensive wait.  He has had a few other emergency room visits for labile blood sugars.  Son states that he has locks on the refrigerator but pts wife will unlock it.  He will get up in the middle of the night.  He is sleeping in the middle of the day.  They did have help between 4-8:30 pm but  he isn't there now and pt is sleeping now between in the afternoon.    Current prescribed movement disorder medications: Carbidopa /levodopa  25/100, 1 tablet 3 times per day  Quetiapine , 25 mg at bedtime (not taking) Donepezil , 10 mg daily  B12 supplement  PREVIOUS MEDICATIONS: Sinemet ; seroquel  (given but not taken)  ALLERGIES:   Allergies  Allergen Reactions   Lipitor  [Atorvastatin] Other (See Comments)    Increased liver enzymes and joint pain from numerous statins   Penicillins Hives and Rash   Amitriptyline Hcl Other (See Comments)    Unknown reaction   Cyclobenzaprine  Other (See Comments)    Burning in stomach   Gabapentin Other (See Comments)    Dizziness    Other Other (See Comments)    [place]: Terrible migraines.   Zetia [Ezetimibe] Other (See Comments)    Joint pain   Aspirin Other (See Comments)    Upset stomach   Doxycycline Hives    Reaction took 4-5 days   Iodinated Contrast Media Hives    Developed hives after contrast 50 years ago when I had a subdural hematoma at age 31.  Tolerates now with Benadryl  50mg  PO one hour before contrast.   Levofloxacin Hives    Delayed allergic reaction    CURRENT MEDICATIONS:  Outpatient Encounter Medications as of 03/11/2023  Medication Sig   acetaminophen  (TYLENOL ) 500 MG tablet Take 500 mg by mouth every 6 (six) hours as needed for moderate pain or headache.   carbidopa -levodopa  (SINEMET  IR) 25-100 MG tablet TAKE 1 TABLET BY MOUTH THREE TIMES A DAY   donepezil  (ARICEPT ) 10 MG tablet TAKE 1 TABLET BY MOUTH EVERYDAY AT BEDTIME   escitalopram  (LEXAPRO ) 10 MG tablet Take 5 mg by mouth daily.  feeding supplement (ENSURE ENLIVE / ENSURE PLUS) LIQD Take 237 mLs by mouth 3 (three) times daily between meals.   Ipratropium-Albuterol  (COMBIVENT) 20-100 MCG/ACT AERS respimat Inhale 1 puff into the lungs every 6 (six) hours as needed for wheezing or shortness of breath.   levocetirizine (XYZAL ) 5 MG tablet Take 5 mg by mouth daily as needed for allergies.   losartan  (COZAAR ) 100 MG tablet Take 100 mg by mouth daily.   metFORMIN (GLUCOPHAGE-XR) 500 MG 24 hr tablet Take 1,000 mg by mouth 2 (two) times daily.   rosuvastatin  (CRESTOR ) 5 MG tablet Take 5 mg by mouth every evening.   vitamin B-12 (CYANOCOBALAMIN ) 1000 MCG tablet Take 1,000 mcg by mouth daily.   No facility-administered encounter medications on  file as of 03/11/2023.    Objective:   PHYSICAL EXAMINATION:    VITALS:   Vitals:   03/11/23 0926  BP: 132/88  Pulse: 77  SpO2: 99%  Weight: 220 lb (99.8 kg)  Height: 5' 4 (1.626 m)    GEN:  The patient appears stated age and is in NAD. HEENT:  Normocephalic, atraumatic.  The mucous membranes are moist.  CV:  RRR Lungs:  ctab  Neurological examination:  Orientation: The patient is alert and oriented x2. Cranial nerves: There is good facial symmetry with mild facial hypomimia. The speech is fluent and hypophonic and lacks spontaneity. Soft palate rises symmetrically and there is no tongue deviation. Hearing is decreased to conversational tone. Sensation: Sensation is intact to light touch throughout Motor: Strength is at least antigravity x4.  Movement examination: Tone: There is normal tone today in the UE/LE Abnormal movements: there is intermittent RUE rest tremor.   Coordination:  There is slowness with all RAMs, R>L Gait and Station: The patient pushes off to arise. He is given his cane.  He shuffles and is forward flexed.  The R leg drags with ambulation and he has a significant difficulty ambulating.  He is freezing in the turn and has trouble getting back to the chair  I have reviewed and interpreted the following labs independently    Chemistry      Component Value Date/Time   NA 134 (L) 10/31/2022 0945   K 3.5 10/31/2022 0945   CL 101 10/31/2022 0945   CO2 25 10/31/2022 0945   BUN 28 (H) 10/31/2022 0945   CREATININE 1.19 10/31/2022 0945      Component Value Date/Time   CALCIUM  9.3 10/31/2022 0945   ALKPHOS 48 10/31/2022 1136   AST 23 10/31/2022 1136   ALT 6 10/31/2022 1136   BILITOT 0.8 10/31/2022 1136       Lab Results  Component Value Date   WBC 9.9 10/31/2022   HGB 11.7 (L) 10/31/2022   HCT 35.7 (L) 10/31/2022   MCV 93.2 10/31/2022   PLT 260 10/31/2022    No results found for: TSH  Total time spent on today's visit was 30 minutes,  including both face-to-face time and nonface-to-face time.  Time included that spent on review of records (prior notes available to me/labs/imaging if pertinent), discussing treatment and goals, answering patient's questions and coordinating care.   Cc:  Rexanne Ingle, MD

## 2023-03-11 ENCOUNTER — Ambulatory Visit: Payer: Medicare Other | Admitting: Neurology

## 2023-03-11 DIAGNOSIS — R4182 Altered mental status, unspecified: Secondary | ICD-10-CM | POA: Diagnosis not present

## 2023-03-11 DIAGNOSIS — F0390 Unspecified dementia without behavioral disturbance: Secondary | ICD-10-CM | POA: Diagnosis not present

## 2023-03-11 DIAGNOSIS — G20A1 Parkinson's disease without dyskinesia, without mention of fluctuations: Secondary | ICD-10-CM

## 2023-03-11 MED ORDER — CARBIDOPA-LEVODOPA 25-100 MG PO TABS
ORAL_TABLET | ORAL | 1 refills | Status: DC
Start: 2023-03-11 — End: 2023-09-06

## 2023-03-11 MED ORDER — DONEPEZIL HCL 10 MG PO TABS
10.0000 mg | ORAL_TABLET | Freq: Every day | ORAL | 2 refills | Status: DC
Start: 2023-03-11 — End: 2024-01-04

## 2023-03-11 NOTE — Patient Instructions (Addendum)
 Increase carbidopa /levodopa , 2 at 8am, 2 at noon, 1 at 4pm Continue donepezil   Local and Online Resources for Power over Parkinson's Group?  January 2025 ?  LOCAL [place] PARKINSON'S GROUPS??  Power over Parkinson's Group:???  Upcoming Power over [employer] Meetings/Care Partner Support:? 2nd Wednesdays of the month at 2 pm:  January 8th, February 12th Contact Lauraine Bend at [email or web address] or Amy Marriott at [email or web address] if interested in participating in this group?  ?  LOCAL EVENTS AND NEW OFFERINGS?  Dance Project Spring 2025:  January 14-May 20, Tuesdays 10-11 am.  All details on website: [email or web address] ACT FITNESS Chair Yoga classes "Train and Gain", Fridays 10 am, [employer].  Contact Gina at [phone].   PWR! Moves [place] class!  Wednesdays at 10 am.  Please contact Greig Anon, PT at [email or web address] if interested. Health Visitor Classes offering at [employer]!? Tuesdays (Chair Yoga)  and Wednesdays (PWR! Moves)  1:00 pm.?? Contact Garrel Challenger [phone] or [email or web address] Drumming for Parkinson's will be held on 2nd and 4th Mondays at 11:00 am.?? Located at the [place] of the [place] ([address] [place]. [place].) *Next class is January 13th.? Contact Slater Lower at [email or web address] or [phone]?  [facility] Parkinson's Tai Chi Class, Mondays at 11 am.  Call [phone] for details  [facility] at [facility]- [address], [place] Wednesdays- 4:00 - 5:00 PM - specifically for Parkinson's Disease.  Free!  Contact Ozell Higashi, [place] - [phone] (clinic) or  [phone] (cell) or by email: [email or web address]   ?ONLINE EDUCATION AND SUPPORT?  Parkinson Foundation:? www.parkinson.org?  PD Health at Home continues:? Mindfulness Mondays, Wellness Wednesdays,  Fitness Fridays??  Upcoming Education:??  Empowerment through Movement, Wednesday, January 15th, 1-2 pm A Deep Dive into Deep Brain Stimulation (DBS), Wednesday, January 29th, 1-2 pm Expert Briefing:    Stay tuned Register for virtual education and expert briefings (webinars) at [email or web address].[email or web address]  Please check out their website to sign up for emails and see their full online offerings??  ?  Ozell JINNY Baseman Foundation:? www.[email or web address].org??  Third Thursday Webinars:? On the third Thursday of every month at 12 p.m. ET, join our free live webinars to learn about various aspects of living with Parkinson's disease and our work to speed medical breakthroughs.?  Upcoming Webinar:? Managing the Hidden Symptoms:  Mood and Motivation Changes in Parkinson's.  Thursday, January 16th at 12 noon.  Check out additional information on their website to see their full online offerings?  ?  [employer]:? www.[email or web address]?  Upcoming Webinar:   Stay tuned Series:? Living with Parkinson's Meetup.?? Third Thursdays each month, 3 pm?  Care Partner Monthly Meetup.? With Jori Axe [place].? First Tuesday of each month, 2 pm?  Check out additional information to Live Well Today on their website?  ?  Parkinson and Movement Disorders (PMD) Alliance:? www.pmdalliance.org?  NeuroLife Online:? Online Education Events?  Sign up for emails, which are sent weekly to give you updates on programming and online offerings?  ?  Parkinson's Association of the [place]:? www.parkinsonassociation.org?  Information on online support groups, education events, and online exercises including Yoga, Parkinson's exercises and more-LOTS of information on links to PD resources and online events?  Virtual Support Group through [employer] of the [place]-First Wednesday of each month at 2 pm   MOVEMENT AND EXERCISE OPPORTUNITIES?  PWR! Moves [place]  class has returned!  Wednesdays at 10 am.  Please contact Greig Anon, PT at [email or web address] if interested. Parkinson's Exercise  Class offerings at [facility]. Tuesdays (Chair yoga) and Wednesdays (PWR! Moves)  1:00 pm.?  Contact Garrel Challenger [phone] or [email or web address]  Parkinson's Wellness Recovery (PWR! Moves)? www.pwr4life.org?  Info on the PWR! Virtual Experience:? You will have access to our expertise?through self-assessment, guided plans that start with the PD-specific fundamentals, educational content, tips, Q&A with an expert, and a growing engineering geologist of PD-specific pre-recorded and live exercise classes of varying types and intensity - both physical and cognitive! If that is not enough, we offer 1:1 wellness consultations (in-person or virtual) to personalize your PWR! Dance Movement Psychotherapist.??  Parkinson [employer] Fridays:??  As part of the PD Health @ Home program, this free video series focuses each week on one aspect of fitness designed to support people living with Parkinson's.? These weekly videos highlight the Parkinson Foundation fitness guidelines for people with Parkinson's disease.?  menuslocal.com.br?  Dance for PD website is offering free, live-stream classes throughout the week, as well as links to parker hannifin of classes:? https://[email or web address]/?  Virtual dance and Pilates for Parkinson's classes: Click on the Community Tab> Parkinson's Movement Initiative Tab.? To register for classes and for more information, visit www.noteback.co.za and click the "community" tab.??  [employer] Parkinson's Cycling Classes??  [facility]:? Thursdays @ Noon-Live classes at [employer] ([employer] at [email or web address]?or [phone])?  [facility]: Classes Tuesday, Wednesday and Thursday (contact [place] at [email or web address] ?or [phone])?  [place]?  Varied levels of classes are offered Tuesdays and Thursdays at [facility].??  Stretching with [place]assah weekly class is also offered for people with Parkinson's?  To observe a class or for more information, call [phone] or email Rolland Dines at [email or web address]?    ADDITIONAL SUPPORT AND RESOURCES?  [employer]:  Chiropractor:? www.well-springsolutions.org/caregiver-education/caregiver-support-group.? You may also contact Jodi Kolada at [facility] -spring.org or [phone].????  [employer] Navigator:? Just1Navigator program, a?free service to help individuals and families through the journey of determining care for older adults.? The "Navigator" is a child psychotherapist, Nat Hock, who will speak with a prospective client and/or loved ones to provide an assessment of the situation and a set of recommendations for a personalized care plan -- all free of charge, and whether?[employer] offers the needed service or not. If the need is not a service we provide, we are well-connected with reputable programs in town that we can refer you to.? www.well-springsolutions.org or to speak with the Navigator, call [phone].?

## 2023-03-26 DIAGNOSIS — K5900 Constipation, unspecified: Secondary | ICD-10-CM | POA: Diagnosis not present

## 2023-03-26 DIAGNOSIS — E11649 Type 2 diabetes mellitus with hypoglycemia without coma: Secondary | ICD-10-CM | POA: Diagnosis not present

## 2023-03-26 DIAGNOSIS — G20A1 Parkinson's disease without dyskinesia, without mention of fluctuations: Secondary | ICD-10-CM | POA: Diagnosis not present

## 2023-03-30 DIAGNOSIS — E11649 Type 2 diabetes mellitus with hypoglycemia without coma: Secondary | ICD-10-CM | POA: Diagnosis not present

## 2023-05-10 ENCOUNTER — Encounter: Payer: Self-pay | Admitting: Podiatry

## 2023-05-10 ENCOUNTER — Ambulatory Visit (INDEPENDENT_AMBULATORY_CARE_PROVIDER_SITE_OTHER): Payer: Medicare Other | Admitting: Podiatry

## 2023-05-10 DIAGNOSIS — E119 Type 2 diabetes mellitus without complications: Secondary | ICD-10-CM

## 2023-05-10 DIAGNOSIS — M79675 Pain in left toe(s): Secondary | ICD-10-CM

## 2023-05-10 DIAGNOSIS — B351 Tinea unguium: Secondary | ICD-10-CM

## 2023-05-10 DIAGNOSIS — M79674 Pain in right toe(s): Secondary | ICD-10-CM | POA: Diagnosis not present

## 2023-05-10 NOTE — Progress Notes (Signed)
This patient returns to my office for at risk foot care.  This patient requires this care by a professional since this patient will be at risk due to having type 1 diabetes. He presents to the office with his son.  This patient is unable to cut nails himself since the patient cannot reach his nails.These nails are painful walking and wearing shoes.  This patient presents for at risk foot care today.  General Appearance  Alert, conversant and in no acute stress.  Vascular  Dorsalis pedis and posterior tibial  pulses are palpable  Right.  Weakly palpable pulses left foot..  Capillary return is within normal limits  bilaterally. Temperature is within normal limits  bilaterally.  Neurologic  Senn-Weinstein monofilament wire test within normal limits  bilaterally. Muscle power within normal limits bilaterally.  Nails Thick disfigured discolored nails with subungual debris  from hallux to fifth toes bilaterally. No evidence of bacterial infection or drainage bilaterally.  Orthopedic  No limitations of motion  feet .  No crepitus or effusions noted.  No bony pathology or digital deformities noted.  Skin  normotropic skin with no porokeratosis noted bilaterally.  No signs of infections or ulcers noted.     Onychomycosis  Pain in right toes  Pain in left toes  Consent was obtained for treatment procedures.   Mechanical debridement of nails 1-5  bilaterally performed with a nail nipper.  Filed with dremel without incident.    Return office visit 3 months                     Told patient to return for periodic foot care and evaluation due to potential at risk complications.   Gardiner Barefoot DPM

## 2023-06-21 DIAGNOSIS — G20A1 Parkinson's disease without dyskinesia, without mention of fluctuations: Secondary | ICD-10-CM | POA: Diagnosis not present

## 2023-06-21 DIAGNOSIS — Z5181 Encounter for therapeutic drug level monitoring: Secondary | ICD-10-CM | POA: Diagnosis not present

## 2023-06-21 DIAGNOSIS — E7800 Pure hypercholesterolemia, unspecified: Secondary | ICD-10-CM | POA: Diagnosis not present

## 2023-06-21 DIAGNOSIS — K5900 Constipation, unspecified: Secondary | ICD-10-CM | POA: Diagnosis not present

## 2023-06-21 DIAGNOSIS — I10 Essential (primary) hypertension: Secondary | ICD-10-CM | POA: Diagnosis not present

## 2023-06-21 DIAGNOSIS — E1169 Type 2 diabetes mellitus with other specified complication: Secondary | ICD-10-CM | POA: Diagnosis not present

## 2023-07-12 ENCOUNTER — Encounter (HOSPITAL_COMMUNITY): Payer: Self-pay | Admitting: Emergency Medicine

## 2023-07-12 ENCOUNTER — Emergency Department (HOSPITAL_COMMUNITY)

## 2023-07-12 ENCOUNTER — Emergency Department (HOSPITAL_COMMUNITY)
Admission: EM | Admit: 2023-07-12 | Discharge: 2023-07-12 | Disposition: A | Discharge status: Home / Self Care | Attending: Emergency Medicine | Admitting: Emergency Medicine

## 2023-07-12 ENCOUNTER — Other Ambulatory Visit: Payer: Self-pay

## 2023-07-12 DIAGNOSIS — I10 Essential (primary) hypertension: Secondary | ICD-10-CM | POA: Diagnosis not present

## 2023-07-12 DIAGNOSIS — J9811 Atelectasis: Secondary | ICD-10-CM | POA: Diagnosis not present

## 2023-07-12 DIAGNOSIS — I6782 Cerebral ischemia: Secondary | ICD-10-CM | POA: Diagnosis not present

## 2023-07-12 DIAGNOSIS — I959 Hypotension, unspecified: Secondary | ICD-10-CM | POA: Diagnosis not present

## 2023-07-12 DIAGNOSIS — W06XXXA Fall from bed, initial encounter: Secondary | ICD-10-CM | POA: Insufficient documentation

## 2023-07-12 DIAGNOSIS — Z79899 Other long term (current) drug therapy: Secondary | ICD-10-CM | POA: Insufficient documentation

## 2023-07-12 DIAGNOSIS — G20A1 Parkinson's disease without dyskinesia, without mention of fluctuations: Secondary | ICD-10-CM | POA: Insufficient documentation

## 2023-07-12 DIAGNOSIS — F0390 Unspecified dementia without behavioral disturbance: Secondary | ICD-10-CM | POA: Diagnosis not present

## 2023-07-12 DIAGNOSIS — I517 Cardiomegaly: Secondary | ICD-10-CM | POA: Diagnosis not present

## 2023-07-12 DIAGNOSIS — Z7984 Long term (current) use of oral hypoglycemic drugs: Secondary | ICD-10-CM | POA: Insufficient documentation

## 2023-07-12 DIAGNOSIS — E109 Type 1 diabetes mellitus without complications: Secondary | ICD-10-CM | POA: Insufficient documentation

## 2023-07-12 DIAGNOSIS — M545 Low back pain, unspecified: Secondary | ICD-10-CM | POA: Diagnosis not present

## 2023-07-12 DIAGNOSIS — R0989 Other specified symptoms and signs involving the circulatory and respiratory systems: Secondary | ICD-10-CM | POA: Diagnosis not present

## 2023-07-12 DIAGNOSIS — Z043 Encounter for examination and observation following other accident: Secondary | ICD-10-CM | POA: Diagnosis not present

## 2023-07-12 DIAGNOSIS — R509 Fever, unspecified: Secondary | ICD-10-CM | POA: Diagnosis not present

## 2023-07-12 DIAGNOSIS — W19XXXA Unspecified fall, initial encounter: Secondary | ICD-10-CM

## 2023-07-12 DIAGNOSIS — S0990XA Unspecified injury of head, initial encounter: Secondary | ICD-10-CM | POA: Diagnosis not present

## 2023-07-12 LAB — SALICYLATE LEVEL: Salicylate Lvl: <7 mg/dL — ABNORMAL LOW (ref 7.0–30.0)

## 2023-07-12 LAB — COMPREHENSIVE METABOLIC PANEL WITH GFR
ALT: 13 U/L (ref 0–44)
AST: 24 U/L (ref 15–41)
Albumin: 3.6 g/dL (ref 3.5–5.0)
Alkaline Phosphatase: 49 U/L (ref 38–126)
Anion gap: 13 (ref 5–15)
BUN: 35 mg/dL — ABNORMAL HIGH (ref 8–23)
CO2: 20 mmol/L — ABNORMAL LOW (ref 22–32)
Calcium: 10.3 mg/dL (ref 8.9–10.3)
Chloride: 105 mmol/L (ref 98–111)
Creatinine, Ser: 1.22 mg/dL (ref 0.61–1.24)
GFR, Estimated: 58 mL/min — ABNORMAL LOW (ref >60)
Glucose, Bld: 108 mg/dL — ABNORMAL HIGH (ref 70–99)
Potassium: 4.2 mmol/L (ref 3.5–5.1)
Sodium: 138 mmol/L (ref 135–145)
Total Bilirubin: 0.5 mg/dL (ref 0.0–1.2)
Total Protein: 6.9 g/dL (ref 6.5–8.1)

## 2023-07-12 LAB — URINALYSIS, ROUTINE W REFLEX MICROSCOPIC
Bilirubin Urine: NEGATIVE
Glucose, UA: NEGATIVE mg/dL
Hgb urine dipstick: NEGATIVE
Ketones, ur: 5 mg/dL — AB
Leukocytes,Ua: NEGATIVE
Nitrite: NEGATIVE
Protein, ur: NEGATIVE mg/dL
Specific Gravity, Urine: 1.019 (ref 1.005–1.030)
pH: 5 (ref 5.0–8.0)

## 2023-07-12 LAB — CBC
HCT: 39.1 % (ref 39.0–52.0)
Hemoglobin: 13 g/dL (ref 13.0–17.0)
MCH: 31.5 pg (ref 26.0–34.0)
MCHC: 33.2 g/dL (ref 30.0–36.0)
MCV: 94.7 fL (ref 80.0–100.0)
Platelets: 269 10*3/uL (ref 150–400)
RBC: 4.13 MIL/uL — ABNORMAL LOW (ref 4.22–5.81)
RDW: 13.3 % (ref 11.5–15.5)
WBC: 10.6 10*3/uL — ABNORMAL HIGH (ref 4.0–10.5)
nRBC: 0 % (ref 0.0–0.2)

## 2023-07-12 LAB — CBG MONITORING, ED: Glucose-Capillary: 100 mg/dL — ABNORMAL HIGH (ref 70–99)

## 2023-07-12 LAB — ACETAMINOPHEN LEVEL: Acetaminophen (Tylenol), Serum: <10 ug/mL — ABNORMAL LOW (ref 10–30)

## 2023-07-12 NOTE — ED Provider Triage Note (Signed)
 Emergency Medicine Provider Triage Evaluation Note  Christian Morgan , a 83 y.o. male  was evaluated in triage.  Pt brought in via EMS following a fall.  Is reported to be haven not at baseline.  He does have dementia.  Had a recent tooth extraction.  Patient states that he is " here for drug test", he denies any pain to me.  Review of Systems  Positive:  Negative:   Physical Exam  BP 137/70 (BP Location: Right Arm)   Pulse 70   Temp 98 F (36.7 C) (Oral)   Resp 18   SpO2 100%  Gen:   Awake, no distress   Resp:  Normal effort  MSK:   Moves extremities without difficulty  Other:  No gross neurodeficits  Medical Decision Making  Medically screening exam initiated at 3:20 PM.  Appropriate orders placed.  Christian Morgan was informed that the remainder of the evaluation will be completed by another provider, this initial triage assessment does not replace that evaluation, and the importance of remaining in the ED until their evaluation is complete.  Eval for altered mental status, vitals are suitable for operating room   Stanton Earthly 07/12/23 1522

## 2023-07-12 NOTE — ED Notes (Signed)
 Patient transported to X-ray

## 2023-07-12 NOTE — ED Triage Notes (Signed)
 BIB EMS from home complaint of fall, slid out of bed. No LOC, no thinners. Had tooth pulled last week  and family states pt has been more altered past few days. Dementia at baseline. Family has not been giving pt prescribed antibiotic for tooth.   EMS VS 100 temp 120/70 70 pulse 100% ra 38 capnography

## 2023-07-12 NOTE — ED Provider Notes (Signed)
 [place] [facility] AT [facility] Provider Note   CSN: [id number] Arrival date & time: 07/12/23  1412     History {Add pertinent medical, surgical, social history, OB history to HPI:1} Chief Complaint  Patient presents with   Fall   Back Pain    Christian Morgan Hasbargen is a 83 y.o. male with past medical history of T1DM, HLD, OSA, HTN, dementia, Parkinson's presents to emergency department for evaluation of buttocks pain following a fall today.  Son at bedside reports that he slipped out of the side of the bed and down onto his butt.  Was complaining of low back pain and bottom pain following.  Fall was not witnessed but he reported following that he did not hit his head.  Wife who lives with patient called her son and nephew to help pick him up.  Was on the floor for about 15 minutes until he was lifted onto a chair by family.  Son reports that patient is acting per his normal.  He normally speaks in small statements and is oriented to himself.  Normally walks with a cane.  Son manages medications.  Reports that CBG is normally 80-1 80.  Patient himself has no complaints.  No additional complaints prior to fall, LOC, AMS, seizure-like activity following fall.  Of note, he had a crown removed 2 days ago.  He was started on a Z-Pak then and son reported that patient started having increased confusion regarding going back home and catching a train to go to work.  He called the dentist to switch his antibiotics to clindamycin  but they have not picked it up yet.  Fall Pertinent negatives include no chest pain, no abdominal pain, no headaches and no shortness of breath.  Back Pain Associated symptoms: no abdominal pain, no chest pain, no fever, no headaches, no numbness and no weakness       Home Medications Prior to Admission medications   Medication Sig Start Date End Date Taking? Authorizing Provider  acetaminophen  (TYLENOL ) 500 MG tablet Take 500 mg by mouth every 6  (six) hours as needed for moderate pain or headache.    [provider]  carbidopa -levodopa  (SINEMET  IR) 25-100 MG tablet 2 at 8am, 2 at noon, 1 at 4pm 03/11/23   Tat, Rebecca S, DO  donepezil  (ARICEPT ) 10 MG tablet Take 1 tablet (10 mg total) by mouth at bedtime. 03/11/23   Tat, Von Grumbling, DO  escitalopram  (LEXAPRO ) 10 MG tablet Take 5 mg by mouth daily.  12/08/19   [provider]  feeding supplement (ENSURE ENLIVE / ENSURE PLUS) LIQD Take 237 mLs by mouth 3 (three) times daily between meals. 08/31/22   Doroteo Gasmen, MD  Ipratropium-Albuterol  (COMBIVENT) 20-100 MCG/ACT AERS respimat Inhale 1 puff into the lungs every 6 (six) hours as needed for wheezing or shortness of breath. 08/31/22   Fonnie Iba I, MD  levocetirizine (XYZAL ) 5 MG tablet Take 5 mg by mouth daily as needed for allergies.    [provider]  losartan  (COZAAR ) 100 MG tablet Take 100 mg by mouth daily.    [provider]  metFORMIN (GLUCOPHAGE-XR) 500 MG 24 hr tablet Take 1,000 mg by mouth 2 (two) times daily.    [provider]  rosuvastatin  (CRESTOR ) 5 MG tablet Take 5 mg by mouth every evening.    [provider]  vitamin B-12 (CYANOCOBALAMIN ) 1000 MCG tablet Take 1,000 mcg by mouth daily.    [provider]  Allergies    Lipitor [atorvastatin], Penicillins, Amitriptyline hcl, Cyclobenzaprine , Gabapentin, Other, Zetia [ezetimibe], Aspirin, Doxycycline, Iodinated contrast media, and Levofloxacin    Review of Systems   Review of Systems  Constitutional:  Negative for chills, fatigue and fever.  Respiratory:  Negative for cough, chest tightness, shortness of breath and wheezing.   Cardiovascular:  Negative for chest pain and palpitations.  Gastrointestinal:  Negative for abdominal pain, constipation, diarrhea, nausea and vomiting.  Musculoskeletal:  Positive for back pain.  Neurological:  Negative for dizziness, seizures, weakness, light-headedness,  numbness and headaches.    Physical Exam Updated Vital Signs BP (!) 147/72   Pulse 64   Temp 98.7 F (37.1 C) (Oral)   Resp 18   Ht 5\' 4"  (1.626 m)   Wt 99.8 kg   SpO2 100%   BMI 37.77 kg/m  Physical Exam Vitals and nursing note reviewed.  Constitutional:      General: He is not in acute distress.    Appearance: Normal appearance. He is not diaphoretic.  HENT:     Head: Normocephalic and atraumatic. No raccoon eyes, Battle's sign or laceration. Hair is normal.     Comments: No hematoma nor TTP of cranium No crepitus to facial bones    Right Ear: Hearing and external ear normal. No hemotympanum.     Left Ear: Hearing and external ear normal. No hemotympanum.     Nose: Nose normal.     Right Nostril: No epistaxis or septal hematoma.     Left Nostril: No epistaxis or septal hematoma.     Mouth/Throat:     Mouth: Mucous membranes are moist. No injury or lacerations.     Dentition: No dental abscesses.     Tongue: No lesions.     Pharynx: Uvula midline. No uvula swelling.     Tonsils: No tonsillar exudate or tonsillar abscesses. 1+ on the right. 1+ on the left.  Eyes:     General: Lids are normal. Vision grossly intact.        Right eye: No discharge.        Left eye: No discharge.     Extraocular Movements: Extraocular movements intact.     Right eye: Normal extraocular motion and no nystagmus.     Left eye: Normal extraocular motion and no nystagmus.     Conjunctiva/sclera: Conjunctivae normal.     Pupils: Pupils are equal, round, and reactive to light.     Comments: No subconjunctival hemorrhage, hyphema, tear drop pupil, or fluid leakage bilaterally  Neck:     Vascular: No carotid bruit.  Cardiovascular:     Rate and Rhythm: Normal rate.     Pulses: Normal pulses.          Radial pulses are 2+ on the right side and 2+ on the left side.       Dorsalis pedis pulses are 2+ on the right side and 2+ on the left side.  Pulmonary:     Effort: Pulmonary effort is normal.  No respiratory distress.     Breath sounds: Normal breath sounds. No wheezing.  Chest:     Chest wall: No tenderness.  Abdominal:     General: Bowel sounds are normal. There is no distension.     Palpations: Abdomen is soft.     Tenderness: There is no abdominal tenderness. There is no guarding or rebound.  Musculoskeletal:     Cervical back: Full passive range of motion without pain, normal range of motion and neck  supple. No deformity, rigidity or bony tenderness. Normal range of motion.     Thoracic back: No deformity or bony tenderness. Normal range of motion.     Lumbar back: No deformity or bony tenderness. Normal range of motion.     Right hip: No bony tenderness or crepitus.     Left hip: No bony tenderness or crepitus.     Right lower leg: No edema.     Left lower leg: No edema.     Comments: No obvious deformity to joints or long bones Pelvis stable with no shortening or rotation of LE bilaterally  Skin:    General: Skin is warm and dry.     Capillary Refill: Capillary refill takes less than 2 seconds.     Comments: No ecchymosis nor abnormal skin findings to chest, abdomen, back, extremities bilaterally.  Neurological:     General: No focal deficit present.     Mental Status: He is alert. Mental status is at baseline.     GCS: GCS eye subscore is 4. GCS verbal subscore is 4. GCS motor subscore is 6.     Cranial Nerves: Cranial nerves 2-12 are intact. No cranial nerve deficit, dysarthria or facial asymmetry.     Sensory: Sensation is intact. No sensory deficit.     Motor: Motor function is intact. No weakness, tremor, abnormal muscle tone, seizure activity or pronator drift.     Coordination: Coordination is intact. Coordination normal. Finger-Nose-Finger Test and Heel to [facility] Test normal.     Gait: Gait is intact. Gait normal.     Deep Tendon Reflexes: Reflexes are normal and symmetric. Reflexes normal.     Comments: following commands appropriately. A&Ox1 to himself with  confusion to place, time, event but is at baseline     ED Results / Procedures / Treatments   Labs (all labs ordered are listed, but only abnormal results are displayed) Labs Reviewed  COMPREHENSIVE METABOLIC PANEL WITH GFR - Abnormal; Notable for the following components:      Result Value   CO2 20 (*)    Glucose, Bld 108 (*)    BUN 35 (*)    GFR, Estimated 58 (*)    All other components within normal limits  CBC - Abnormal; Notable for the following components:   WBC 10.6 (*)    RBC 4.13 (*)    All other components within normal limits  ACETAMINOPHEN  LEVEL - Abnormal; Notable for the following components:   Acetaminophen  (Tylenol ), Serum <10 (*)    All other components within normal limits  SALICYLATE LEVEL - Abnormal; Notable for the following components:   Salicylate Lvl <7.0 (*)    All other components within normal limits  CBG MONITORING, ED - Abnormal; Notable for the following components:   Glucose-Capillary 100 (*)    All other components within normal limits  URINALYSIS, ROUTINE W REFLEX MICROSCOPIC  AMMONIA    EKG None  Radiology CT Head Wo Contrast Result Date: 07/12/2023 CLINICAL DATA:  Fall with mental status change EXAM: CT HEAD WITHOUT CONTRAST TECHNIQUE: Contiguous axial images were obtained from the base of the skull through the vertex without intravenous contrast. RADIATION DOSE REDUCTION: This exam was performed according to the departmental dose-optimization program which includes automated exposure control, adjustment of the mA and/or kV according to patient size and/or use of iterative reconstruction technique. COMPARISON:  Head CT 10/17/2022 FINDINGS: Brain: No acute territorial infarction, hemorrhage or intracranial mass. Advanced atrophy. Mild chronic small vessel ischemic changes of  the white matter. Stable ventricle size. Vascular: No hyperdense vessels.  Carotid vascular calcification. Skull: No fracture.  Left craniotomy Sinuses/Orbits: No acute  finding. Other: None IMPRESSION: 1. No CT evidence for acute intracranial abnormality. 2. Advanced atrophy and mild chronic small vessel ischemic changes of the white matter. Electronically Signed   By: Esmeralda Hedge M.D.   On: 07/12/2023 19:25   DG Chest 2 View Result Date: 07/12/2023 CLINICAL DATA:  Fall. EXAM: CHEST - 2 VIEW COMPARISON:  10/31/2022. FINDINGS: Patient's head obscures the superior mediastinum and medial lung apices. Heart is enlarged. Thoracic aorta is somewhat uncoiled. Lungs are low in volume with minimal streaky atelectasis in the lung bases. No definite pleural fluid. IMPRESSION: Low lung volumes.  No acute findings. Electronically Signed   By: Shearon Denis M.D.   On: 07/12/2023 17:59    Procedures Procedures  {Document cardiac monitor, telemetry assessment procedure when appropriate:1}  Medications Ordered in ED Medications - No data to display  ED Course/ Medical Decision Making/ A&P   {   Click here for ABCD2, HEART and other calculatorsREFRESH Note before signing :1}                              Medical Decision Making Amount and/or Complexity of Data Reviewed Labs: ordered.     Patient presents to the ED for concern of ***, this involves an extensive number of treatment options, and is a complaint that carries with it a high risk of complications and morbidity.  The differential diagnosis includes ***   Co morbidities that complicate the patient evaluation  ***   Additional history obtained:  Additional history obtained from *** {Blank multiple:19196::"EMS","Family","Nursing","Outside Medical Records","Past Admission"}   External records from outside source obtained and reviewed including ***   Lab Tests:  I Ordered, and personally interpreted labs.  The pertinent results include:  ***   Imaging Studies ordered:  I ordered imaging studies including ***  I independently visualized and interpreted imaging which showed *** I agree with the  radiologist interpretation   Cardiac Monitoring:  The patient was maintained on a cardiac monitor.  I personally viewed and interpreted the cardiac monitored which showed an underlying rhythm of: ***   Medicines ordered and prescription drug management:  I ordered medication including ***  for ***  Reevaluation of the patient after these medicines showed that the patient {resolved/improved/worsened:23923::"improved"} I have reviewed the patients home medicines and have made adjustments as needed     Problem List / ED Course:  Fall Patient appears at his baseline.  No thinners CT without ICH.  Chest x-ray without acute cardiopulmonary pathology Labs without gross abnormalities nor requiring any intervention UA pending and will need in and out   Reevaluation:  After the interventions noted above, I reevaluated the patient and found that they have :stayed the same     Dispostion:  After consideration of the diagnostic results and the patients response to treatment, I feel that the patent would benefit from outpatient management and PCP follow-up.   Discussed ED workup, disposition, return to ED precautions with patient who expresses understanding agrees with plan.  All questions answered to their satisfaction.  They are agreeable to plan.  Discharge instructions provided on paperwork  Final Clinical Impression(s) / ED Diagnoses Final diagnoses:  Fall, initial encounter    Rx / DC Orders ED Discharge Orders     None

## 2023-07-12 NOTE — Discharge Instructions (Addendum)
 Thank you for letting us  evaluate you today.  Your labs were unremarkable.  Your CT head did not show any bleeding.  Your chest x-ray was unremarkable.  Your urine was negative for infection.  Please follow-up with PCP if you continue to have any pain or symptoms following this fall  Please follow-up with your dentist regarding extraction, antibiotics

## 2023-07-16 DIAGNOSIS — E7800 Pure hypercholesterolemia, unspecified: Secondary | ICD-10-CM | POA: Diagnosis not present

## 2023-07-16 DIAGNOSIS — W19XXXA Unspecified fall, initial encounter: Secondary | ICD-10-CM | POA: Diagnosis not present

## 2023-07-16 DIAGNOSIS — E1169 Type 2 diabetes mellitus with other specified complication: Secondary | ICD-10-CM | POA: Diagnosis not present

## 2023-07-16 DIAGNOSIS — G20A1 Parkinson's disease without dyskinesia, without mention of fluctuations: Secondary | ICD-10-CM | POA: Diagnosis not present

## 2023-08-09 ENCOUNTER — Ambulatory Visit: Admitting: Podiatry

## 2023-08-09 ENCOUNTER — Encounter: Payer: Self-pay | Admitting: Podiatry

## 2023-08-09 DIAGNOSIS — B351 Tinea unguium: Secondary | ICD-10-CM | POA: Diagnosis not present

## 2023-08-09 DIAGNOSIS — M79675 Pain in left toe(s): Secondary | ICD-10-CM

## 2023-08-09 DIAGNOSIS — M79674 Pain in right toe(s): Secondary | ICD-10-CM

## 2023-08-09 DIAGNOSIS — E119 Type 2 diabetes mellitus without complications: Secondary | ICD-10-CM | POA: Diagnosis not present

## 2023-08-09 NOTE — Progress Notes (Signed)
 This patient returns to my office for at risk foot care.  This patient requires this care by a professional since this patient will be at risk due to having type 1 diabetes. He presents to the office with his son.  This patient is unable to cut nails himself since the patient cannot reach his nails.These nails are painful walking and wearing shoes.  This patient presents for at risk foot care today.  General Appearance  Alert, conversant and in no acute stress.  Vascular  Dorsalis pedis and posterior tibial  pulses are palpable  Right.  Weakly palpable pulses left foot..  Capillary return is within normal limits  bilaterally. Temperature is within normal limits  bilaterally.  Neurologic  Senn-Weinstein monofilament wire test within normal limits  bilaterally. Muscle power within normal limits bilaterally.  Nails Thick disfigured discolored nails with subungual debris  from hallux to fifth toes bilaterally. No evidence of bacterial infection or drainage bilaterally.  Orthopedic  No limitations of motion  feet .  No crepitus or effusions noted.  No bony pathology or digital deformities noted.  Skin  normotropic skin with no porokeratosis noted bilaterally.  No signs of infections or ulcers noted.     Onychomycosis  Pain in right toes  Pain in left toes  Consent was obtained for treatment procedures.   Mechanical debridement of nails 1-5  bilaterally performed with a nail nipper.  Filed with dremel without incident.    Return office visit 4   months                     Told patient to return for periodic foot care and evaluation due to potential at risk complications.   Ruffin Cotton DPM

## 2023-09-05 ENCOUNTER — Other Ambulatory Visit: Payer: Self-pay | Admitting: Neurology

## 2023-09-05 DIAGNOSIS — G20A1 Parkinson's disease without dyskinesia, without mention of fluctuations: Secondary | ICD-10-CM

## 2023-09-09 NOTE — Progress Notes (Unsigned)
 Assessment/Plan:   1.  Parkinsons Disease  -continue carbidopa /levodopa  25/100, 2 at 8am/2 at noon/1 at 4pm.  Discussed interaction with protein as they are giving 2nd with peanut butter.  Pt often only also getting it bid due to sleeping all of the time.  Has significant day/night reversal.  -doesn't want PT.    -he is seeing dermatology  2.  Dementia  -Had neurocognitive testing in June, 2021 with evidence of dementia, both parkinsonism and Alzheimer's.  -Continue donepezil , 10 mg daily.  -day/night reversal is big issue unfortunately (partly due to sons working nights as well)  3.  B12 deficiency  -Continue over-the-counter B12  4.  ? TGA  -Patient had an episode in July, 2022 where he had trouble remembering the people around him.  Patient does have baseline dementia, so difficult exactly to say what this was.  CT brain was negative.  Subjective:   Christian Morgan was seen today in follow up for parkinsonism.  My previous records were reviewed prior to todays visit as well as outside records available to me.  Patient is with his son who supplements the history.  Patient was in the emergency room on May 12 after a fall.  Patient was sitting on the side of the bed and slipped off and fell on his buttocks.  Emergency room workup was negative.  He saw his primary care physician May 16.  Last visit, his levodopa  was increased.  He has tolerated that medication increase well.  This was the only fall/slip.  They had a caregiver for a while but that person got a full time job so they don't have him any longer.  Woke up one night and brought in scissors and said that they were for the cats (and they have no cats).  He is sleeping much of the day and awake at night.  Pills are ground up and given in applesauce in the AM and given in PB in the evening.  He sometimes only gets them bid because of sleeping.  Current prescribed movement disorder medications: Carbidopa /levodopa  25/100, 2 at 8  AM, 2 at noon, 1 at 4 PM (increased) Donepezil , 10 mg daily  B12 supplement/B complex  PREVIOUS MEDICATIONS: Sinemet ; seroquel  (given but not taken)  ALLERGIES:   Allergies  Allergen Reactions   Lipitor [Atorvastatin] Other (See Comments)    Increased liver enzymes and joint pain from numerous statins   Penicillins Hives and Rash   Amitriptyline Hcl Other (See Comments)    Unknown reaction   Cyclobenzaprine  Other (See Comments)    Burning in stomach   Gabapentin Other (See Comments)    Dizziness    Other Other (See Comments)    [place]: Terrible migraines.   Zetia [Ezetimibe] Other (See Comments)    Joint pain   Aspirin Other (See Comments)    Upset stomach   Doxycycline Hives    Reaction took 4-5 days   Iodinated Contrast Media Hives    Developed hives after contrast 50 years ago when I had a subdural hematoma at age 18.  Tolerates now with Benadryl  50mg  PO one hour before contrast.   Levofloxacin Hives    Delayed allergic reaction    CURRENT MEDICATIONS:  Outpatient Encounter Medications as of 09/13/2023  Medication Sig   acetaminophen  (TYLENOL ) 500 MG tablet Take 500 mg by mouth every 6 (six) hours as needed for moderate pain or headache.   carbidopa -levodopa  (SINEMET  IR) 25-100 MG tablet TAKE 2 TABLETS AT 8AM,  2 AT NOON, 1 AT 4PM   donepezil  (ARICEPT ) 10 MG tablet Take 1 tablet (10 mg total) by mouth at bedtime.   escitalopram  (LEXAPRO ) 10 MG tablet Take 5 mg by mouth daily.    feeding supplement (ENSURE ENLIVE / ENSURE PLUS) LIQD Take 237 mLs by mouth 3 (three) times daily between meals.   Ipratropium-Albuterol  (COMBIVENT) 20-100 MCG/ACT AERS respimat Inhale 1 puff into the lungs every 6 (six) hours as needed for wheezing or shortness of breath.   levocetirizine (XYZAL ) 5 MG tablet Take 5 mg by mouth daily as needed for allergies.   losartan  (COZAAR ) 100 MG tablet Take 100 mg by mouth daily.   metFORMIN (GLUCOPHAGE-XR) 500 MG 24 hr tablet Take 1,000 mg by mouth 2  (two) times daily.   rosuvastatin  (CRESTOR ) 5 MG tablet Take 5 mg by mouth every evening.   vitamin B-12 (CYANOCOBALAMIN ) 1000 MCG tablet Take 1,000 mcg by mouth daily.   No facility-administered encounter medications on file as of 09/13/2023.    Objective:   PHYSICAL EXAMINATION:    VITALS:   Vitals:   09/13/23 1033  BP: 128/76  SpO2: 98%  Weight: 191 lb (86.6 kg)  Height: 5' 8 (1.727 m)     GEN:  The patient appears stated age and is in NAD. HEENT:  Normocephalic, atraumatic.  The mucous membranes are moist.  CV:  RRR Lungs:  ctab  Neurological examination:  Orientation: The patient is alert.  Family does most of talking.  Follows simple commands Cranial nerves: There is good facial symmetry with mild facial hypomimia. The speech is fluent and hypophonic and lacks spontaneity, as previous. Soft palate rises symmetrically and there is no tongue deviation. Hearing is decreased to conversational tone. Sensation: Sensation is intact to light touch throughout Motor: Strength is at least antigravity x4.  Movement examination: Tone: There is mild to mod increased tone in the RUE Abnormal movements: there is intermittent RUE rest tremor.   Coordination:  There is slowness with all RAMs, R>L Gait and Station: The patient pushes off to arise and needs assist to do so. He is given his cane.  He shuffles and is forward flexed.  The R leg drags with ambulation and he has a significant difficulty ambulating.  This is stable from prior  I have reviewed and interpreted the following labs independently    Chemistry      Component Value Date/Time   NA 138 07/12/2023 1458   K 4.2 07/12/2023 1458   CL 105 07/12/2023 1458   CO2 20 (L) 07/12/2023 1458   BUN 35 (H) 07/12/2023 1458   CREATININE 1.22 07/12/2023 1458      Component Value Date/Time   CALCIUM  10.3 07/12/2023 1458   ALKPHOS 49 07/12/2023 1458   AST 24 07/12/2023 1458   ALT 13 07/12/2023 1458   BILITOT 0.5 07/12/2023 1458        Lab Results  Component Value Date   WBC 10.6 (H) 07/12/2023   HGB 13.0 07/12/2023   HCT 39.1 07/12/2023   MCV 94.7 07/12/2023   PLT 269 07/12/2023    No results found for: TSH  Total time spent on today's visit was 23 minutes, including both face-to-face time and nonface-to-face time.  Time included that spent on review of records (prior notes available to me/labs/imaging if pertinent), discussing treatment and goals, answering patient's questions and coordinating care.   Cc:  Rexanne Ingle, MD

## 2023-09-13 ENCOUNTER — Ambulatory Visit: Payer: Medicare Other | Admitting: Neurology

## 2023-09-13 VITALS — BP 128/76 | Ht 68.0 in | Wt 191.0 lb

## 2023-09-13 DIAGNOSIS — G20A1 Parkinson's disease without dyskinesia, without mention of fluctuations: Secondary | ICD-10-CM | POA: Diagnosis not present

## 2023-09-13 DIAGNOSIS — R350 Frequency of micturition: Secondary | ICD-10-CM | POA: Diagnosis not present

## 2023-09-13 DIAGNOSIS — I10 Essential (primary) hypertension: Secondary | ICD-10-CM | POA: Diagnosis not present

## 2023-09-13 DIAGNOSIS — E7800 Pure hypercholesterolemia, unspecified: Secondary | ICD-10-CM | POA: Diagnosis not present

## 2023-09-13 DIAGNOSIS — E1165 Type 2 diabetes mellitus with hyperglycemia: Secondary | ICD-10-CM | POA: Diagnosis not present

## 2023-09-13 DIAGNOSIS — F0390 Unspecified dementia without behavioral disturbance: Secondary | ICD-10-CM

## 2023-09-13 NOTE — Addendum Note (Signed)
 Addended by: EVONNIE STABS S on: 09/13/2023 12:12 PM   Modules accepted: Level of Service

## 2023-09-13 NOTE — Patient Instructions (Signed)

## 2023-11-25 DIAGNOSIS — G20A1 Parkinson's disease without dyskinesia, without mention of fluctuations: Secondary | ICD-10-CM | POA: Diagnosis not present

## 2023-11-25 DIAGNOSIS — W19XXXA Unspecified fall, initial encounter: Secondary | ICD-10-CM | POA: Diagnosis not present

## 2023-12-01 ENCOUNTER — Other Ambulatory Visit: Payer: Self-pay | Admitting: Neurology

## 2023-12-01 DIAGNOSIS — G20A1 Parkinson's disease without dyskinesia, without mention of fluctuations: Secondary | ICD-10-CM

## 2023-12-08 ENCOUNTER — Ambulatory Visit: Admitting: Podiatry

## 2023-12-08 ENCOUNTER — Encounter: Payer: Self-pay | Admitting: Podiatry

## 2023-12-08 DIAGNOSIS — M79675 Pain in left toe(s): Secondary | ICD-10-CM | POA: Diagnosis not present

## 2023-12-08 DIAGNOSIS — M79674 Pain in right toe(s): Secondary | ICD-10-CM

## 2023-12-08 DIAGNOSIS — B351 Tinea unguium: Secondary | ICD-10-CM

## 2023-12-08 DIAGNOSIS — E119 Type 2 diabetes mellitus without complications: Secondary | ICD-10-CM

## 2023-12-08 NOTE — Progress Notes (Signed)
 This patient returns to my office for at risk foot care.  This patient requires this care by a professional since this patient will be at risk due to having type 1 diabetes. He presents to the office with his son.  This patient is unable to cut nails himself since the patient cannot reach his nails.These nails are painful walking and wearing shoes.  This patient presents for at risk foot care today.  General Appearance  Alert, conversant and in no acute stress.  Vascular  Dorsalis pedis and posterior tibial  pulses are palpable  Right.  Weakly palpable pulses left foot..  Capillary return is within normal limits  bilaterally. Temperature is within normal limits  bilaterally.  Neurologic  Senn-Weinstein monofilament wire test within normal limits  bilaterally. Muscle power within normal limits bilaterally.  Nails Thick disfigured discolored nails with subungual debris  from hallux to fifth toes bilaterally. No evidence of bacterial infection or drainage bilaterally.  Orthopedic  No limitations of motion  feet .  No crepitus or effusions noted.  No bony pathology or digital deformities noted.  Skin  normotropic skin with no porokeratosis noted bilaterally.  No signs of infections or ulcers noted.     Onychomycosis  Pain in right toes  Pain in left toes  Consent was obtained for treatment procedures.   Mechanical debridement of nails 1-5  bilaterally performed with a nail nipper.  Filed with dremel without incident.    Return office visit 4   months                     Told patient to return for periodic foot care and evaluation due to potential at risk complications.   Ruffin Cotton DPM

## 2023-12-21 ENCOUNTER — Emergency Department (HOSPITAL_BASED_OUTPATIENT_CLINIC_OR_DEPARTMENT_OTHER)
Admission: EM | Admit: 2023-12-21 | Discharge: 2023-12-21 | Disposition: A | Discharge status: Home / Self Care | Attending: Emergency Medicine | Admitting: Emergency Medicine

## 2023-12-21 ENCOUNTER — Emergency Department (HOSPITAL_BASED_OUTPATIENT_CLINIC_OR_DEPARTMENT_OTHER)

## 2023-12-21 ENCOUNTER — Other Ambulatory Visit: Payer: Self-pay

## 2023-12-21 ENCOUNTER — Encounter (HOSPITAL_BASED_OUTPATIENT_CLINIC_OR_DEPARTMENT_OTHER): Payer: Self-pay

## 2023-12-21 DIAGNOSIS — S199XXA Unspecified injury of neck, initial encounter: Secondary | ICD-10-CM | POA: Diagnosis not present

## 2023-12-21 DIAGNOSIS — W19XXXA Unspecified fall, initial encounter: Secondary | ICD-10-CM

## 2023-12-21 DIAGNOSIS — S0990XA Unspecified injury of head, initial encounter: Secondary | ICD-10-CM | POA: Diagnosis not present

## 2023-12-21 DIAGNOSIS — R2681 Unsteadiness on feet: Secondary | ICD-10-CM | POA: Diagnosis not present

## 2023-12-21 DIAGNOSIS — I672 Cerebral atherosclerosis: Secondary | ICD-10-CM | POA: Diagnosis not present

## 2023-12-21 DIAGNOSIS — W01198A Fall on same level from slipping, tripping and stumbling with subsequent striking against other object, initial encounter: Secondary | ICD-10-CM | POA: Insufficient documentation

## 2023-12-21 DIAGNOSIS — G20A1 Parkinson's disease without dyskinesia, without mention of fluctuations: Secondary | ICD-10-CM | POA: Diagnosis not present

## 2023-12-21 LAB — CBC WITH DIFFERENTIAL/PLATELET
Abs Immature Granulocytes: 0.03 K/uL (ref 0.00–0.07)
Basophils Absolute: 0.1 K/uL (ref 0.0–0.1)
Basophils Relative: 1 %
Eosinophils Absolute: 0.2 K/uL (ref 0.0–0.5)
Eosinophils Relative: 3 %
HCT: 36.5 % — ABNORMAL LOW (ref 39.0–52.0)
Hemoglobin: 12.1 g/dL — ABNORMAL LOW (ref 13.0–17.0)
Immature Granulocytes: 0 %
Lymphocytes Relative: 32 %
Lymphs Abs: 2.8 K/uL (ref 0.7–4.0)
MCH: 30.6 pg (ref 26.0–34.0)
MCHC: 33.2 g/dL (ref 30.0–36.0)
MCV: 92.4 fL (ref 80.0–100.0)
Monocytes Absolute: 0.7 K/uL (ref 0.1–1.0)
Monocytes Relative: 8 %
Neutro Abs: 5 K/uL (ref 1.7–7.7)
Neutrophils Relative %: 56 %
Platelets: 276 K/uL (ref 150–400)
RBC: 3.95 MIL/uL — ABNORMAL LOW (ref 4.22–5.81)
RDW: 13.4 % (ref 11.5–15.5)
WBC: 8.9 K/uL (ref 4.0–10.5)
nRBC: 0 % (ref 0.0–0.2)

## 2023-12-21 LAB — BASIC METABOLIC PANEL WITH GFR
Anion gap: 11 (ref 5–15)
BUN: 30 mg/dL — ABNORMAL HIGH (ref 8–23)
CO2: 25 mmol/L (ref 22–32)
Calcium: 10.4 mg/dL — ABNORMAL HIGH (ref 8.9–10.3)
Chloride: 99 mmol/L (ref 98–111)
Creatinine, Ser: 1.24 mg/dL (ref 0.61–1.24)
GFR, Estimated: 57 mL/min — ABNORMAL LOW (ref >60)
Glucose, Bld: 160 mg/dL — ABNORMAL HIGH (ref 70–99)
Potassium: 4.1 mmol/L (ref 3.5–5.1)
Sodium: 135 mmol/L (ref 135–145)

## 2023-12-21 NOTE — ED Triage Notes (Signed)
 Pt was walking and tripped, causing him to fall, hitting the back of his head on the wall. Denies LOC. Pt has unsteady gait and is being treated for Parkinsons and is suppose to walk with a walker according to son.

## 2023-12-22 NOTE — ED Provider Notes (Signed)
 [place] [facility]ERG[facility] DE[facility]MENT AT [facility] Provider Note   CSN: [id number] Arrival date & time: 12/21/23  0422     Patient presents with: [name] is a 83 y.o. male.   history of diabetes and hand tremors, currently being treated with medications including metformin, escitalopram , Crestor , losartan , Sinemet , and donepezil . He presents after a fall at home, resulting in a head injury. The incident occurred when the patient tripped and hit his head against a wall, creating a hole in the sheetrock. He did not use his walker at the time of the fall, although he typically uses assistive devices for mobility. The patient denies any current pain, weakness, or dizziness. He is not on any blood thinners. There were no bumps or tenderness noted on his head post-fall. The history was obtained from the patient and his son.   Fall       Prior to Admission medications   Medication Sig Start Date End Date Taking? Authorizing Provider  acetaminophen  (TYLENOL ) 500 MG tablet Take 500 mg by mouth every 6 (six) hours as needed for moderate pain or headache.    [provider]  carbidopa -levodopa  (SINEMET  IR) 25-100 MG tablet TAKE 2 TABLETS AT 8AM, 2 AT NOON, 1 AT 4PM 12/02/23   Tat, Rebecca S, DO  donepezil  (ARICEPT ) 10 MG tablet Take 1 tablet (10 mg total) by mouth at bedtime. 03/11/23   Tat, Asberry RAMAN, DO  escitalopram  (LEXAPRO ) 10 MG tablet Take 5 mg by mouth daily.  12/08/19   [provider]  feeding supplement (ENSURE ENLIVE / ENSURE PLUS) LIQD Take 237 mLs by mouth 3 (three) times daily between meals. 08/31/22   Rosario Leatrice FERNS, MD  Ipratropium-Albuterol  (COMBIVENT) 20-100 MCG/ACT AERS respimat Inhale 1 puff into the lungs every 6 (six) hours as needed for wheezing or shortness of breath. 08/31/22   Rosario Leatrice I, MD  levocetirizine (XYZAL ) 5 MG tablet Take 5 mg by mouth daily as needed for allergies.    [provider]  losartan   (COZAAR ) 100 MG tablet Take 100 mg by mouth daily.    [provider]  metFORMIN (GLUCOPHAGE-XR) 500 MG 24 hr tablet Take 1,000 mg by mouth 2 (two) times daily.    [provider]  rosuvastatin  (CRESTOR ) 5 MG tablet Take 5 mg by mouth every evening.    [provider]  vitamin B-12 (CYANOCOBALAMIN ) 1000 MCG tablet Take 1,000 mcg by mouth daily.    [provider]    Allergies: Lipitor [atorvastatin], Penicillins, Amitriptyline hcl, Cyclobenzaprine , Gabapentin, Other, Zetia [ezetimibe], Aspirin, Doxycycline, Iodinated contrast media, and Levofloxacin    Review of Systems  Updated Vital Signs BP (!) 141/70   Pulse 67   Temp 98.3 F (36.8 C) (Axillary)   Resp 18   SpO2 97%   Physical Exam Vitals and nursing note reviewed.  Constitutional:      Appearance: He is well-developed.  HENT:     Head: Normocephalic and atraumatic.  Cardiovascular:     Rate and Rhythm: Normal rate.  Pulmonary:     Effort: Pulmonary effort is normal. No respiratory distress.  Abdominal:     General: There is no distension.  Musculoskeletal:        General: Normal range of motion.     Cervical back: Normal range of motion.  Skin:    General: Skin is warm and dry.  Neurological:     General: No focal deficit present.     Mental  Status: He is alert. Mental status is at baseline.     (all labs ordered are listed, but only abnormal results are displayed) Labs Reviewed  CBC WITH DIFFERENTIAL/PLATELET - Abnormal; Notable for the following components:      Result Value   RBC 3.95 (*)    Hemoglobin 12.1 (*)    HCT 36.5 (*)    All other components within normal limits  BASIC METABOLIC PANEL WITH GFR - Abnormal; Notable for the following components:   Glucose, Bld 160 (*)    BUN 30 (*)    Calcium  10.4 (*)    GFR, Estimated 57 (*)    All other components within normal limits    EKG: None  Radiology: CT Cervical Spine Wo Contrast Result Date: 12/21/2023 EXAM:  CT CERVICAL SPINE WITHOUT CONTRAST 12/21/2023 05:20:43 AM TECHNIQUE: CT of the cervical spine was performed without the administration of intravenous contrast. Multiplanar reformatted images are provided for review. Automated exposure control, iterative reconstruction, and/or weight based adjustment of the mA/kV was utilized to reduce the radiation dose to as low as reasonably achievable. COMPARISON: Cervical spine CT 10/17/2022. CLINICAL HISTORY: 83 year old male. Neck trauma. Fall, hitting back of head on wall. Denies LOC. Unsteady gait, Parkinson's. FINDINGS: CERVICAL SPINE: BONES AND ALIGNMENT: Stable cervical lordosis. No acute traumatic injury identified in the cervical spine. DEGENERATIVE CHANGES: Chronic and generally age appropriate cervical spine degeneration. Chronic advanced disc and endplate degeneration at C5-C6. SOFT TISSUES: Elongated stylohyoid ligament calcification. No prevertebral soft tissue swelling. IMPRESSION: 1. No acute traumatic injury identified in the cervical spine. Electronically signed by: Helayne Hurst MD 12/21/2023 06:03 AM EDT RP Workstation: [id number]   CT Head Wo Contrast Result Date: 12/21/2023 EXAM: CT HEAD WITHOUT CONTRAST 12/21/2023 05:20:43 AM TECHNIQUE: CT of the head was performed without the administration of intravenous contrast. Automated exposure control, iterative reconstruction, and/or weight based adjustment of the mA/kV was utilized to reduce the radiation dose to as low as reasonably achievable. COMPARISON: 07/12/2023 CLINICAL HISTORY: Head trauma, minor (Age >= 65y). Pt was walking and tripped, causing him to fall, hitting the back of his head on the wall. Denies LOC. Pt has unsteady gait and is being treated for Parkinsons and is suppose to walk with a walker according to son. FINDINGS: BRAIN AND VENTRICLES: No acute hemorrhage. No evidence of acute infarct. Patchy decreased attenuation throughout deep and periventricular white matter bilaterally, compatible  with chronic microvascular ischemic disease. No hydrocephalus. No extra-axial collection. No mass effect or midline shift. Atherosclerotic calcifications within cavernous internal carotid and vertebral arteries. ORBITS: Bilateral lens replacement. SINUSES: No acute abnormality. SOFT TISSUES AND SKULL: Left craniotomy noted. No acute soft tissue abnormality. No skull fracture. IMPRESSION: 1. No acute intracranial abnormality. 2. Chronic Left craniotomy changes. Electronically signed by: Helayne Hurst MD 12/21/2023 06:01 AM EDT RP Workstation: [id number]     Procedures   Medications Ordered in the ED - No data to display                                  Medical Decision Making Amount and/or Complexity of Data Reviewed Labs: ordered. Radiology: ordered.   No obvious significant injury from the fall.  Overall patient appears well at baseline.  Long talk with him and his son about using walker and all assistive devices at home.  Stable for discharge with son.   Final diagnoses:  Fall, initial encounter    ED Discharge Orders  None          Karynn Deblasi, Selinda, MD 12/22/23 [phone]

## 2023-12-28 DIAGNOSIS — Z0000 Encounter for general adult medical examination without abnormal findings: Secondary | ICD-10-CM | POA: Diagnosis not present

## 2023-12-28 DIAGNOSIS — E7800 Pure hypercholesterolemia, unspecified: Secondary | ICD-10-CM | POA: Diagnosis not present

## 2023-12-28 DIAGNOSIS — I10 Essential (primary) hypertension: Secondary | ICD-10-CM | POA: Diagnosis not present

## 2023-12-28 DIAGNOSIS — E1169 Type 2 diabetes mellitus with other specified complication: Secondary | ICD-10-CM | POA: Diagnosis not present

## 2024-01-04 ENCOUNTER — Other Ambulatory Visit: Payer: Self-pay | Admitting: Neurology

## 2024-01-04 DIAGNOSIS — F0390 Unspecified dementia without behavioral disturbance: Secondary | ICD-10-CM

## 2024-01-04 DIAGNOSIS — R4182 Altered mental status, unspecified: Secondary | ICD-10-CM

## 2024-01-04 DIAGNOSIS — G20A1 Parkinson's disease without dyskinesia, without mention of fluctuations: Secondary | ICD-10-CM

## 2024-02-10 ENCOUNTER — Emergency Department (HOSPITAL_COMMUNITY)

## 2024-02-10 ENCOUNTER — Encounter (HOSPITAL_COMMUNITY): Payer: Self-pay

## 2024-02-10 ENCOUNTER — Emergency Department (HOSPITAL_COMMUNITY)
Admission: EM | Admit: 2024-02-10 | Discharge: 2024-02-10 | Disposition: A | Discharge status: Home / Self Care | Attending: Emergency Medicine | Admitting: Emergency Medicine

## 2024-02-10 ENCOUNTER — Other Ambulatory Visit: Payer: Self-pay

## 2024-02-10 DIAGNOSIS — Z87891 Personal history of nicotine dependence: Secondary | ICD-10-CM | POA: Insufficient documentation

## 2024-02-10 DIAGNOSIS — G20C Parkinsonism, unspecified: Secondary | ICD-10-CM | POA: Insufficient documentation

## 2024-02-10 DIAGNOSIS — F0390 Unspecified dementia without behavioral disturbance: Secondary | ICD-10-CM | POA: Diagnosis not present

## 2024-02-10 DIAGNOSIS — R400 Somnolence: Secondary | ICD-10-CM | POA: Diagnosis present

## 2024-02-10 DIAGNOSIS — E109 Type 1 diabetes mellitus without complications: Secondary | ICD-10-CM | POA: Insufficient documentation

## 2024-02-10 DIAGNOSIS — Z7984 Long term (current) use of oral hypoglycemic drugs: Secondary | ICD-10-CM | POA: Insufficient documentation

## 2024-02-10 DIAGNOSIS — G4710 Hypersomnia, unspecified: Secondary | ICD-10-CM | POA: Insufficient documentation

## 2024-02-10 DIAGNOSIS — I10 Essential (primary) hypertension: Secondary | ICD-10-CM | POA: Insufficient documentation

## 2024-02-10 DIAGNOSIS — G309 Alzheimer's disease, unspecified: Secondary | ICD-10-CM | POA: Insufficient documentation

## 2024-02-10 DIAGNOSIS — Z79899 Other long term (current) drug therapy: Secondary | ICD-10-CM | POA: Diagnosis not present

## 2024-02-10 LAB — CBC WITH DIFFERENTIAL/PLATELET
Abs Immature Granulocytes: 0.02 K/uL (ref 0.00–0.07)
Basophils Absolute: 0.1 K/uL (ref 0.0–0.1)
Basophils Relative: 1 %
Eosinophils Absolute: 0.2 K/uL (ref 0.0–0.5)
Eosinophils Relative: 2 %
HCT: 36 % — ABNORMAL LOW (ref 39.0–52.0)
Hemoglobin: 11.9 g/dL — ABNORMAL LOW (ref 13.0–17.0)
Immature Granulocytes: 0 %
Lymphocytes Relative: 28 %
Lymphs Abs: 2.8 K/uL (ref 0.7–4.0)
MCH: 31 pg (ref 26.0–34.0)
MCHC: 33.1 g/dL (ref 30.0–36.0)
MCV: 93.8 fL (ref 80.0–100.0)
Monocytes Absolute: 1 K/uL (ref 0.1–1.0)
Monocytes Relative: 10 %
Neutro Abs: 6.1 K/uL (ref 1.7–7.7)
Neutrophils Relative %: 59 %
Platelets: 239 K/uL (ref 150–400)
RBC: 3.84 MIL/uL — ABNORMAL LOW (ref 4.22–5.81)
RDW: 13.4 % (ref 11.5–15.5)
WBC: 10.2 K/uL (ref 4.0–10.5)
nRBC: 0 % (ref 0.0–0.2)

## 2024-02-10 LAB — COMPREHENSIVE METABOLIC PANEL WITH GFR
ALT: 16 U/L (ref 0–44)
AST: 18 U/L (ref 15–41)
Albumin: 3.2 g/dL — ABNORMAL LOW (ref 3.5–5.0)
Alkaline Phosphatase: 62 U/L (ref 38–126)
Anion gap: 9 (ref 5–15)
BUN: 23 mg/dL (ref 8–23)
CO2: 24 mmol/L (ref 22–32)
Calcium: 8.9 mg/dL (ref 8.9–10.3)
Chloride: 99 mmol/L (ref 98–111)
Creatinine, Ser: 1.04 mg/dL (ref 0.61–1.24)
GFR, Estimated: >60 mL/min (ref >60)
Glucose, Bld: 116 mg/dL — ABNORMAL HIGH (ref 70–99)
Potassium: 3.8 mmol/L (ref 3.5–5.1)
Sodium: 132 mmol/L — ABNORMAL LOW (ref 135–145)
Total Bilirubin: 1 mg/dL (ref 0.0–1.2)
Total Protein: 6.4 g/dL — ABNORMAL LOW (ref 6.5–8.1)

## 2024-02-10 LAB — URINALYSIS, W/ REFLEX TO CULTURE (INFECTION SUSPECTED)
Bilirubin Urine: NEGATIVE
Glucose, UA: NEGATIVE mg/dL
Hgb urine dipstick: NEGATIVE
Ketones, ur: NEGATIVE mg/dL
Leukocytes,Ua: NEGATIVE
Nitrite: NEGATIVE
Protein, ur: NEGATIVE mg/dL
Specific Gravity, Urine: 1.025 (ref 1.005–1.030)
pH: 5.5 (ref 5.0–8.0)

## 2024-02-10 NOTE — ED Triage Notes (Signed)
 PT BIB [facility] from home after c/o lethargy/ falls x 3-4 days.After dinner tonight, PT fell asleep at the table and it took family 45 min to awaken. Foul urine smell is reported.PT given a 500L of fluid as a bolus.

## 2024-02-10 NOTE — ED Notes (Signed)
 In and Out Catheter Performed. Second RN is Doctor, Hospital. 150 ml of light yellow/clear urine.

## 2024-02-10 NOTE — ED Notes (Signed)
 Family at bedside. Pt had taken off all vital sign monitoring, reapplied.

## 2024-02-10 NOTE — ED Provider Notes (Signed)
  EMERGENCY DEPARTMENT AT [facility] Provider Note   CSN: [id number] Arrival date & time: 02/10/24  0017     Patient presents with: Fatigue   Christian Morgan is a 83 y.o. male.  Patient presents to the emergency department via EMS after reported somnolence at dinner.  Family reports that over the past week the patient has slipped out of the bed onto his bottom 2-3 times but has never hit his head.  These have been mechanical falls as he tries to move from bed and overall has generalized weakness.  He has past medical history significant for type I DM, hypertension, daytime somnolence, major neurocognitive disorder of unclear etiology, Parkinson's disease.  Patient's family at bedside states that they looked at ring cameras and noted that the patient fell asleep at the dinner table.  He reportedly took close to 45 minutes to wake up.  At this time he is back to his baseline.  They state this has happened once or twice in the past when his glucose was elevated in the 200 range.  It is reportedly over 200 per EMS.  The patient has no complaints at this time.  He denies abdominal pain, nausea, vomiting.  He has been having increased urinary frequency but this has been evaluated outpatient by urology.  He wears depends at baseline.   HPI     Prior to Admission medications  Medication Sig Start Date End Date Taking? Authorizing Provider  acetaminophen  (TYLENOL ) 500 MG tablet Take 500 mg by mouth every 6 (six) hours as needed for moderate pain or headache.    [provider]  carbidopa -levodopa  (SINEMET  IR) 25-100 MG tablet TAKE 2 TABLETS AT 8AM, 2 AT NOON, 1 AT 4PM 12/02/23   Tat, Rebecca S, DO  donepezil  (ARICEPT ) 10 MG tablet TAKE 1 TABLET BY MOUTH EVERYDAY AT BEDTIME 01/04/24   Tat, Rebecca S, DO  escitalopram  (LEXAPRO ) 10 MG tablet Take 5 mg by mouth daily.  12/08/19   [provider]  feeding supplement (ENSURE ENLIVE / ENSURE PLUS) LIQD Take 237 mLs by  mouth 3 (three) times daily between meals. 08/31/22   Rosario Leatrice FERNS, MD  Ipratropium-Albuterol  (COMBIVENT) 20-100 MCG/ACT AERS respimat Inhale 1 puff into the lungs every 6 (six) hours as needed for wheezing or shortness of breath. 08/31/22   Rosario Leatrice I, MD  levocetirizine (XYZAL ) 5 MG tablet Take 5 mg by mouth daily as needed for allergies.    [provider]  losartan  (COZAAR ) 100 MG tablet Take 100 mg by mouth daily.    [provider]  metFORMIN (GLUCOPHAGE-XR) 500 MG 24 hr tablet Take 1,000 mg by mouth 2 (two) times daily.    [provider]  rosuvastatin  (CRESTOR ) 5 MG tablet Take 5 mg by mouth every evening.    [provider]  vitamin B-12 (CYANOCOBALAMIN ) 1000 MCG tablet Take 1,000 mcg by mouth daily.    [provider]    Allergies: Lipitor [atorvastatin], Penicillins, Amitriptyline hcl, Cyclobenzaprine , Gabapentin, Other, Zetia [ezetimibe], Aspirin, Doxycycline, Iodinated contrast media, and Levofloxacin    Review of Systems  Updated Vital Signs BP (!) 120/48   Pulse 71   Temp 98.8 F (37.1 C) (Temporal)   Resp 19   Ht 5' 8 (1.727 m)   Wt 86.6 kg   SpO2 100%   BMI 29.03 kg/m   Physical Exam Vitals and nursing note reviewed.  Constitutional:      General: He is not in acute  distress.    Appearance: He is well-developed.  HENT:     Head: Normocephalic and atraumatic.  Eyes:     Conjunctiva/sclera: Conjunctivae normal.  Cardiovascular:     Rate and Rhythm: Normal rate and regular rhythm.     Heart sounds: No murmur heard. Pulmonary:     Effort: Pulmonary effort is normal. No respiratory distress.     Breath sounds: Normal breath sounds.  Abdominal:     Palpations: Abdomen is soft.     Tenderness: There is no abdominal tenderness.  Musculoskeletal:        General: No swelling.     Cervical back: Neck supple.  Skin:    General: Skin is warm and dry.     Capillary Refill: Capillary refill takes less than 2  seconds.  Neurological:     General: No focal deficit present.     Mental Status: He is alert. Mental status is at baseline.  Psychiatric:        Mood and Affect: Mood normal.     (all labs ordered are listed, but only abnormal results are displayed) Labs Reviewed  COMPREHENSIVE METABOLIC PANEL WITH GFR - Abnormal; Notable for the following components:      Result Value   Sodium 132 (*)    Glucose, Bld 116 (*)    Total Protein 6.4 (*)    Albumin 3.2 (*)    All other components within normal limits  CBC WITH DIFFERENTIAL/PLATELET - Abnormal; Notable for the following components:   RBC 3.84 (*)    Hemoglobin 11.9 (*)    HCT 36.0 (*)    All other components within normal limits  URINALYSIS, W/ REFLEX TO CULTURE (INFECTION SUSPECTED) - Abnormal; Notable for the following components:   Bacteria, UA RARE (*)    All other components within normal limits    EKG: None  Radiology: CT Head Wo Contrast Result Date: 02/10/2024 EXAM: CT HEAD WITHOUT 02/10/2024 01:48:05 AM TECHNIQUE: CT of the head was performed without the administration of intravenous contrast. Automated exposure control, iterative reconstruction, and/or weight based adjustment of the mA/kV was utilized to reduce the radiation dose to as low as reasonably achievable. COMPARISON: 12/21/2023 CLINICAL HISTORY: Mental status change, unknown cause FINDINGS: BRAIN AND VENTRICLES: No acute intracranial hemorrhage. No mass effect or midline shift. No extra-axial fluid collection. No evidence of acute infarct. No hydrocephalus. Moderate generalized brain atrophy. Mild periventricular and deep cerebral white matter disease. VASCULATURE: Moderate calcific atheromatous disease in carotid siphons and vertebral arteries. ORBITS: Bilateral lens replacement. SINUSES AND MASTOIDS: Mild mucosal disease within right maxillary sinus floor. SOFT TISSUES AND SKULL: No acute skull fracture. Left parietal craniotomy. IMPRESSION: 1. No acute  intracranial abnormality. Electronically signed by: Morgane Naveau MD 02/10/2024 01:54 AM EST RP Workstation: [id number]     Procedures   Medications Ordered in the ED - No data to display                                  Medical Decision Making Amount and/or Complexity of Data Reviewed Labs: ordered. Radiology: ordered.   This patient presents to the ED for concern of possible somnolence, this involves an extensive number of treatment options, and is a complaint that carries with it a high risk of complications and morbidity.  The differential diagnosis includes baseline somnolence, infection, intracranial abnormality, metabolic abnormality, electrolyte abnormality, others   Co morbidities / Chronic conditions that complicate the  patient evaluation  As noted in HPI   Additional history obtained:  Additional history obtained from EMR External records from outside source obtained and reviewed including neurology notes showing history of Parkinson's disease, on carbidopa  levodopa .  Notes sleeping all the time and taking it only twice a day due to sleepiness.  Patient also with history of parkinsonism and Alzheimer's dementia.  On donepezil .   Lab Tests:  I Ordered, and personally interpreted labs.  The pertinent results include: Grossly unremarkable CMP, CBC, UA   Imaging Studies ordered:  I ordered imaging studies including CT head  I independently visualized and interpreted imaging which showed no acute findings I agree with the radiologist interpretation   Social Determinants of Health:  Patient is a former smoker   Test / Admission - Considered:  Patient back to baseline at this time.  Chart review shows extensive history of daytime somnolence and some worsening dementia with parkinsonism and Alzheimer's.  Suspect this was a flare of his underlying condition.  Lab work grossly unremarkable.  CT of the head grossly unremarkable.  Plan to discharge home at this  time with family.      Final diagnoses:  Excessive sleepiness    ED Discharge Orders     None          Logan Ubaldo KATHEE DEVONNA 02/10/24 [id number]    Raford Lenis, MD 02/10/24 [phone]

## 2024-02-10 NOTE — Discharge Instructions (Signed)
 Your workup this morning was reassuring.  Your lab work shows no sign of infection.  Your head CT was also without any acute abnormality.  Please follow-up with your primary care provider for further evaluation as needed.

## 2024-02-10 NOTE — ED Notes (Signed)
 Pt pulled off all vitals monitoring equipment again. Pt reoriented and family member educated to try and remind him to not pull off the monitoring equipment.

## 2024-02-28 ENCOUNTER — Other Ambulatory Visit: Payer: Self-pay | Admitting: Neurology

## 2024-02-28 DIAGNOSIS — G20A1 Parkinson's disease without dyskinesia, without mention of fluctuations: Secondary | ICD-10-CM

## 2024-03-09 ENCOUNTER — Ambulatory Visit (INDEPENDENT_AMBULATORY_CARE_PROVIDER_SITE_OTHER): Admitting: Podiatry

## 2024-03-09 ENCOUNTER — Encounter: Payer: Self-pay | Admitting: Podiatry

## 2024-03-09 DIAGNOSIS — M79674 Pain in right toe(s): Secondary | ICD-10-CM

## 2024-03-09 DIAGNOSIS — M79675 Pain in left toe(s): Secondary | ICD-10-CM

## 2024-03-09 DIAGNOSIS — E119 Type 2 diabetes mellitus without complications: Secondary | ICD-10-CM

## 2024-03-09 DIAGNOSIS — B351 Tinea unguium: Secondary | ICD-10-CM

## 2024-03-09 NOTE — Progress Notes (Signed)
 This patient returns to my office for at risk foot care.  This patient requires this care by a professional since this patient will be at risk due to having type 1 diabetes. He presents to the office with his son.  This patient is unable to cut nails himself since the patient cannot reach his nails.These nails are painful walking and wearing shoes.  This patient presents for at risk foot care today.  General Appearance  Alert, conversant and in no acute stress.  Vascular  Dorsalis pedis and posterior tibial  pulses are palpable  Right.  Weakly palpable pulses left foot..  Capillary return is within normal limits  bilaterally. Temperature is within normal limits  bilaterally.  Neurologic  Senn-Weinstein monofilament wire test within normal limits  bilaterally. Muscle power within normal limits bilaterally.  Nails Thick disfigured discolored nails with subungual debris  from hallux to fifth toes bilaterally. No evidence of bacterial infection or drainage bilaterally.  Orthopedic  No limitations of motion  feet .  No crepitus or effusions noted.  No bony pathology or digital deformities noted.  Skin  normotropic skin with no porokeratosis noted bilaterally.  No signs of infections or ulcers noted.     Onychomycosis  Pain in right toes  Pain in left toes  Consent was obtained for treatment procedures.   Mechanical debridement of nails 1-5  bilaterally performed with a nail nipper.  Filed with dremel without incident.    Return office visit 4   months                     Told patient to return for periodic foot care and evaluation due to potential at risk complications.   Ruffin Cotton DPM

## 2024-03-21 NOTE — Progress Notes (Unsigned)
 "   Assessment/Plan:   1.  Parkinsons Disease  -continue carbidopa /levodopa  25/100, 2 at 8am/2 at noon/1 at 4pm.  Discussed interaction with protein as they are giving 2nd with peanut butter.  Pt often only also getting it bid due to sleeping all of the time.  Has significant day/night reversal.  -doesn't want PT.    -he is seeing dermatology  2.  Dementia  -Had neurocognitive testing in June, 2021 with evidence of dementia, both parkinsonism and Alzheimer's.  -Continue donepezil , 10 mg daily.  -day/night reversal is big issue unfortunately (partly due to sons working nights as well)  3.  B12 deficiency  -Continue over-the-counter B12  4.  ? TGA  -Patient had an episode in July, 2022 where he had trouble remembering the people around him.  Patient does have baseline dementia, so difficult exactly to say what this was.  CT brain was negative.  Subjective:   Christian Morgan was seen today in follow up for parkinsonism.  My previous records were reviewed prior to todays visit as well as outside records available to me.  Patient is with his son who supplements the history.  Patient was in the emergency room October 21 after a fall.  Patient tripped and hit his head on the wall, which ultimately created a hole in the sheet rock.  He was not using his walker.  He was evaluated in the emergency room and discharged.  He was back in the emergency room December 11 with what is described as being very somnolent.  Workup was unremarkable.  He was discharged from the emergency room.  Current prescribed movement disorder medications: Carbidopa /levodopa  25/100, 2 at 8 AM, 2 at noon, 1 at 4 PM (increased) Donepezil , 10 mg daily  B12 supplement/B complex  PREVIOUS MEDICATIONS: Sinemet ; seroquel  (given but not taken)  ALLERGIES:   Allergies  Allergen Reactions   Lipitor [Atorvastatin] Other (See Comments)    Increased liver enzymes and joint pain from numerous statins   Penicillins Hives and  Rash   Amitriptyline Hcl Other (See Comments)    Unknown reaction   Cyclobenzaprine  Other (See Comments)    Burning in stomach   Gabapentin Other (See Comments)    Dizziness    Other Other (See Comments)    [place]: Terrible migraines.   Zetia [Ezetimibe] Other (See Comments)    Joint pain   Aspirin Other (See Comments)    Upset stomach   Doxycycline Hives    Reaction took 4-5 days   Iodinated Contrast Media Hives    Developed hives after contrast 50 years ago when I had a subdural hematoma at age 65.  Tolerates now with Benadryl  50mg  PO one hour before contrast.   Levofloxacin Hives    Delayed allergic reaction    CURRENT MEDICATIONS:  Outpatient Encounter Medications as of 03/23/2024  Medication Sig   acetaminophen  (TYLENOL ) 500 MG tablet Take 500 mg by mouth every 6 (six) hours as needed for moderate pain or headache.   carbidopa -levodopa  (SINEMET  IR) 25-100 MG tablet TAKE 2 TABLETS AT 8AM, 2 AT NOON, 1 AT 4PM   donepezil  (ARICEPT ) 10 MG tablet TAKE 1 TABLET BY MOUTH EVERYDAY AT BEDTIME   escitalopram  (LEXAPRO ) 10 MG tablet Take 5 mg by mouth daily.    feeding supplement (ENSURE ENLIVE / ENSURE PLUS) LIQD Take 237 mLs by mouth 3 (three) times daily between meals.   Ipratropium-Albuterol  (COMBIVENT) 20-100 MCG/ACT AERS respimat Inhale 1 puff into the lungs every 6 (six) hours  as needed for wheezing or shortness of breath.   levocetirizine (XYZAL ) 5 MG tablet Take 5 mg by mouth daily as needed for allergies.   losartan  (COZAAR ) 100 MG tablet Take 100 mg by mouth daily.   metFORMIN (GLUCOPHAGE-XR) 500 MG 24 hr tablet Take 1,000 mg by mouth 2 (two) times daily.   rosuvastatin  (CRESTOR ) 5 MG tablet Take 5 mg by mouth every evening.   vitamin B-12 (CYANOCOBALAMIN ) 1000 MCG tablet Take 1,000 mcg by mouth daily.   No facility-administered encounter medications on file as of 03/23/2024.    Objective:   PHYSICAL EXAMINATION:    VITALS:   There were no vitals filed for this  visit.    GEN:  The patient appears stated age and is in NAD. HEENT:  Normocephalic, atraumatic.  The mucous membranes are moist.  CV:  RRR Lungs:  ctab  Neurological examination:  Orientation: The patient is alert.  Family does most of talking.  Follows simple commands Cranial nerves: There is good facial symmetry with mild facial hypomimia. The speech is fluent and hypophonic and lacks spontaneity, as previous. Soft palate rises symmetrically and there is no tongue deviation. Hearing is decreased to conversational tone. Sensation: Sensation is intact to light touch throughout Motor: Strength is at least antigravity x4.  Movement examination: Tone: There is mild to mod increased tone in the RUE Abnormal movements: there is intermittent RUE rest tremor.   Coordination:  There is slowness with all RAMs, R>L Gait and Station: The patient pushes off to arise and needs assist to do so. He is given his cane.  He shuffles and is forward flexed.  The R leg drags with ambulation and he has a significant difficulty ambulating.  This is stable from prior  I have reviewed and interpreted the following labs independently    Chemistry      Component Value Date/Time   NA 132 (L) 02/10/2024 0240   K 3.8 02/10/2024 0240   CL 99 02/10/2024 0240   CO2 24 02/10/2024 0240   BUN 23 02/10/2024 0240   CREATININE 1.04 02/10/2024 0240      Component Value Date/Time   CALCIUM  8.9 02/10/2024 0240   ALKPHOS 62 02/10/2024 0240   AST 18 02/10/2024 0240   ALT 16 02/10/2024 0240   BILITOT 1.0 02/10/2024 0240       Lab Results  Component Value Date   WBC 10.2 02/10/2024   HGB 11.9 (L) 02/10/2024   HCT 36.0 (L) 02/10/2024   MCV 93.8 02/10/2024   PLT 239 02/10/2024    No results found for: TSH  Total time spent on today's visit was *** minutes, including both face-to-face time and nonface-to-face time.  Time included that spent on review of records (prior notes available to me/labs/imaging if  pertinent), discussing treatment and goals, answering patient's questions and coordinating care.   Cc:  Rexanne Ingle, MD "

## 2024-03-22 ENCOUNTER — Emergency Department (HOSPITAL_COMMUNITY)

## 2024-03-22 ENCOUNTER — Emergency Department (HOSPITAL_COMMUNITY)
Admission: EM | Admit: 2024-03-22 | Discharge: 2024-03-22 | Disposition: A | Discharge status: Home / Self Care | Attending: Emergency Medicine | Admitting: Emergency Medicine

## 2024-03-22 ENCOUNTER — Other Ambulatory Visit: Payer: Self-pay

## 2024-03-22 DIAGNOSIS — M79605 Pain in left leg: Secondary | ICD-10-CM | POA: Insufficient documentation

## 2024-03-22 DIAGNOSIS — M7989 Other specified soft tissue disorders: Secondary | ICD-10-CM | POA: Insufficient documentation

## 2024-03-22 DIAGNOSIS — F0390 Unspecified dementia without behavioral disturbance: Secondary | ICD-10-CM | POA: Insufficient documentation

## 2024-03-22 DIAGNOSIS — Z7984 Long term (current) use of oral hypoglycemic drugs: Secondary | ICD-10-CM | POA: Insufficient documentation

## 2024-03-22 DIAGNOSIS — R600 Localized edema: Secondary | ICD-10-CM | POA: Diagnosis not present

## 2024-03-22 DIAGNOSIS — I10 Essential (primary) hypertension: Secondary | ICD-10-CM | POA: Diagnosis not present

## 2024-03-22 DIAGNOSIS — E109 Type 1 diabetes mellitus without complications: Secondary | ICD-10-CM | POA: Insufficient documentation

## 2024-03-22 DIAGNOSIS — Z79899 Other long term (current) drug therapy: Secondary | ICD-10-CM | POA: Insufficient documentation

## 2024-03-22 DIAGNOSIS — R0602 Shortness of breath: Secondary | ICD-10-CM | POA: Insufficient documentation

## 2024-03-22 DIAGNOSIS — M79604 Pain in right leg: Secondary | ICD-10-CM | POA: Insufficient documentation

## 2024-03-22 LAB — CBC
HCT: 34.5 % — ABNORMAL LOW (ref 39.0–52.0)
Hemoglobin: 11.3 g/dL — ABNORMAL LOW (ref 13.0–17.0)
MCH: 30.5 pg (ref 26.0–34.0)
MCHC: 32.8 g/dL (ref 30.0–36.0)
MCV: 93.2 fL (ref 80.0–100.0)
Platelets: 274 K/uL (ref 150–400)
RBC: 3.7 MIL/uL — ABNORMAL LOW (ref 4.22–5.81)
RDW: 13.7 % (ref 11.5–15.5)
WBC: 10 K/uL (ref 4.0–10.5)
nRBC: 0 % (ref 0.0–0.2)

## 2024-03-22 LAB — RESP PANEL BY RT-PCR (RSV, FLU A&B, COVID)  RVPGX2
Influenza A by PCR: NEGATIVE
Influenza B by PCR: NEGATIVE
Resp Syncytial Virus by PCR: NEGATIVE
SARS Coronavirus 2 by RT PCR: NEGATIVE

## 2024-03-22 LAB — COMPREHENSIVE METABOLIC PANEL WITH GFR
ALT: 14 U/L (ref 0–44)
AST: 31 U/L (ref 15–41)
Albumin: 3.7 g/dL (ref 3.5–5.0)
Alkaline Phosphatase: 79 U/L (ref 38–126)
Anion gap: 11 (ref 5–15)
BUN: 21 mg/dL (ref 8–23)
CO2: 26 mmol/L (ref 22–32)
Calcium: 9.8 mg/dL (ref 8.9–10.3)
Chloride: 103 mmol/L (ref 98–111)
Creatinine, Ser: 1.17 mg/dL (ref 0.61–1.24)
GFR, Estimated: >60 mL/min
Glucose, Bld: 136 mg/dL — ABNORMAL HIGH (ref 70–99)
Potassium: 4 mmol/L (ref 3.5–5.1)
Sodium: 139 mmol/L (ref 135–145)
Total Bilirubin: 0.7 mg/dL (ref 0.0–1.2)
Total Protein: 6.8 g/dL (ref 6.5–8.1)

## 2024-03-22 LAB — PRO BRAIN NATRIURETIC PEPTIDE: Pro Brain Natriuretic Peptide: 112 pg/mL

## 2024-03-22 MED ORDER — CEPHALEXIN 500 MG PO CAPS: 500.0000 mg | ORAL_CAPSULE | Freq: Four times a day (QID) | ORAL | 0 refills | Status: DC

## 2024-03-22 NOTE — Discharge Instructions (Addendum)
 Leg swelling may be due to decrease muscle contractions from prolonged sitting in a recliner chair.  Please wear TED hose to provide compression on the legs.  Take antibiotic as prescribed to decrease risk of skin infection.  Keep legs elevated while at rest.  Follow-up closely with primary care provider for further management.  Return if you develop fever, shortness of breath, coughing up blood, or if you have other concern.

## 2024-03-22 NOTE — ED Provider Notes (Signed)
 " [facility] Provider Note   CSN: [id number] Arrival date & time: 03/22/24  1504     Patient presents with: Leg Pain and Leg Swelling   Christian Morgan is a 84 y.o. male.   The history is provided by a relative, the EMS personnel and medical records. No language interpreter was used.  Leg Pain    84 year old male with history of type 1 diabetes, dementia, hypertension, brought here via EMS from home with concerns of leg pain and swelling.  History obtained through son who is at bedside.  For the past 3 days patient has had progressive worsening bilateral leg swelling and pain.  Is having difficulty with ambulation, and seems to be weaker than usual.  No report of any fever or difficulty breathing.  No recent cold symptoms.  No recent trauma.  Patient normally sleeps in a recliner.  He is having trouble getting up despite his sons attempting full assist.  No prior history of PE or DVT.  No recent change in medication or dietary changes.  No history of CHF.  Son stated patient is mentating at baseline.  Prior to Admission medications  Medication Sig Start Date End Date Taking? Authorizing Provider  acetaminophen  (TYLENOL ) 500 MG tablet Take 500 mg by mouth every 6 (six) hours as needed for moderate pain or headache.    [provider]  carbidopa -levodopa  (SINEMET  IR) 25-100 MG tablet TAKE 2 TABLETS AT 8AM, 2 AT NOON, 1 AT 4PM 02/28/24   Tat, Rebecca S, DO  donepezil  (ARICEPT ) 10 MG tablet TAKE 1 TABLET BY MOUTH EVERYDAY AT BEDTIME 01/04/24   Tat, Asberry RAMAN, DO  escitalopram  (LEXAPRO ) 10 MG tablet Take 5 mg by mouth daily.  12/08/19   [provider]  feeding supplement (ENSURE ENLIVE / ENSURE PLUS) LIQD Take 237 mLs by mouth 3 (three) times daily between meals. 08/31/22   Rosario Leatrice FERNS, MD  Ipratropium-Albuterol  (COMBIVENT) 20-100 MCG/ACT AERS respimat Inhale 1 puff into the lungs every 6 (six) hours as needed for wheezing  or shortness of breath. 08/31/22   Rosario Leatrice I, MD  levocetirizine (XYZAL ) 5 MG tablet Take 5 mg by mouth daily as needed for allergies.    [provider]  losartan  (COZAAR ) 100 MG tablet Take 100 mg by mouth daily.    [provider]  metFORMIN (GLUCOPHAGE-XR) 500 MG 24 hr tablet Take 1,000 mg by mouth 2 (two) times daily.    [provider]  rosuvastatin  (CRESTOR ) 5 MG tablet Take 5 mg by mouth every evening.    [provider]  vitamin B-12 (CYANOCOBALAMIN ) 1000 MCG tablet Take 1,000 mcg by mouth daily.    [provider]    Allergies: Lipitor [atorvastatin], Penicillins, Amitriptyline hcl, Cyclobenzaprine , Gabapentin, Other, Zetia [ezetimibe], Aspirin, Doxycycline, Iodinated contrast media, and Levofloxacin    Review of Systems  Unable to perform ROS: Dementia    Updated Vital Signs BP (!) 163/74   Pulse 90   Temp 98 F (36.7 C) (Oral)   Resp 18   Ht 5' 8 (1.727 m)   Wt 87 kg   SpO2 100%   BMI 29.16 kg/m   Physical Exam Constitutional:      General: He is not in acute distress.    Appearance: He is well-developed.  HENT:     Head: Atraumatic.  Eyes:     Conjunctiva/sclera: Conjunctivae normal.  Cardiovascular:     Rate and Rhythm: Normal rate  and regular rhythm.  Pulmonary:     Effort: Pulmonary effort is normal.     Breath sounds: Normal breath sounds.  Abdominal:     Palpations: Abdomen is soft.  Musculoskeletal:     Cervical back: Normal range of motion and neck supple.     Right lower leg: Edema present.     Left lower leg: Edema present.     Comments: Globally weak but nonfocal  Skin:    Findings: No rash.  Neurological:     Mental Status: He is alert. Mental status is at baseline.     (all labs ordered are listed, but only abnormal results are displayed) Labs Reviewed  COMPREHENSIVE METABOLIC PANEL WITH GFR - Abnormal; Notable for the following components:      Result Value   Glucose, Bld 136 (*)     All other components within normal limits  CBC - Abnormal; Notable for the following components:   RBC 3.70 (*)    Hemoglobin 11.3 (*)    HCT 34.5 (*)    All other components within normal limits  RESP PANEL BY RT-PCR (RSV, FLU A&B, COVID)  [id number]  PRO BRAIN NATRIURETIC PEPTIDE    EKG: None  Radiology: DG Foot 2 Views Right Result Date: 03/22/2024 EXAM: 3 OR MORE VIEW(S) XRAY OF THE RIGHT FOOT 03/22/2024 09:33:00 PM COMPARISON: None available. CLINICAL HISTORY: Pain, swelling. FINDINGS: BONES AND JOINTS: No acute fracture. No malalignment. SOFT TISSUES: Soft tissue swelling along the dorsum of the foot. IMPRESSION: 1. No acute bony abnormality. Electronically signed by: Franky Crease MD 03/22/2024 09:37 PM EST RP Workstation: [id number]   DG Chest Port 1 View Result Date: 03/22/2024 EXAM: 1 VIEW(S) XRAY OF THE CHEST 03/22/2024 05:50:00 PM COMPARISON: 07/12/2023 CLINICAL HISTORY: edema FINDINGS: LUNGS AND PLEURA: No focal pulmonary opacity. No pleural effusion. No pneumothorax. HEART AND MEDIASTINUM: No acute abnormality of the cardiac and mediastinal silhouettes. BONES AND SOFT TISSUES: Thoracic degenerative changes. IMPRESSION: 1. No acute process. Electronically signed by: Greig Pique MD 03/22/2024 06:05 PM EST RP Workstation: [id number]     Procedures   Medications Ordered in the ED - No data to display                                  Medical Decision Making Amount and/or Complexity of Data Reviewed Labs: ordered. Radiology: ordered.  Risk Prescription drug management.   BP (!) 163/74   Pulse 90   Temp 98 F (36.7 C) (Oral)   Resp 18   Ht 5' 8 (1.727 m)   Wt 87 kg   SpO2 100%   BMI 29.16 kg/m   5:29 PM  84 year old male with history of type 1 diabetes, dementia, hypertension, brought here via EMS from home with concerns of leg pain and swelling.  History obtained through son who is at bedside.  For the past 3 days patient has had progressive worsening  bilateral leg swelling and pain.  Is having difficulty with ambulation, and seems to be weaker than usual.  No report of any fever or difficulty breathing.  No recent cold symptoms.  No recent trauma.  Patient normally sleeps in a recliner.  He is having trouble getting up despite his sons attempting full assist.  No prior history of PE or DVT.  No recent change in medication or dietary changes.  No history of CHF.  Son stated patient is mentating at baseline.  On exam  patient is sleeping but arousable.  Son states this is his baseline's as this is his usual time to sleep.  He is globally weak but nonfocal.  He does have bilateral 2+ pitting up to his lower extremities extending towards the knees with some surrounding skin erythema to both legs as well.  Intact distal pedal pulses.  No signs of trauma.  Leg compartments are soft.  Legs swelling are likely peripheral edema most likely from lack of mobility and spending most of his time in a recliner.  Will perform DVT study, will check for any potential signs of CHF.  -Labs ordered, independently viewed and interpreted by me.  Labs remarkable for negative respiratory panel, electrolytes are overall reassuring, normal WBC, normal proBNP -The patient was maintained on a cardiac monitor.  I personally viewed and interpreted the cardiac monitored which showed an underlying rhythm of: Sinus rhythm -Imaging independently viewed and interpreted by me and I agree with radiologist's interpretation.  Result remarkable for chest x-ray without any concerning finding -This patient presents to the ED for concern of leg swelling, this involves an extensive number of treatment options, and is a complaint that carries with it a high risk of complications and morbidity.  The differential diagnosis includes dependent edema, cellulitis, DVT, CHF, anasarca, lymphedema -Co morbidities that complicate the patient evaluation includes diabetes, hypertension -Treatment includes  observation -Reevaluation of the patient after these medicines showed that the patient stayed the same -PCP office notes or outside notes reviewed -Discussion with attending Dr. Freddi -Escalation to admission/observation considered: patient and son are comfortable with discharge, and will follow up with PCP -Prescription medication considered, patient comfortable with keflex  -Social Determinant of Health considered which includes tobacco use  Recommend antibiotic, compressive TED hose, keep leg elevated and close outpatient follow-up. An x-ray of the right foot was obtained and reviewed interpreted by me and fortunately no evidence of fracture or dislocation.     Final diagnoses:  Leg swelling    ED Discharge Orders          Ordered    cephALEXin  (KEFLEX ) 500 MG capsule  4 times daily        03/22/24 2105               Nivia Colon, PA-C 03/22/24 2254  "

## 2024-03-22 NOTE — ED Triage Notes (Addendum)
 Patient arrives via [facility] from home for bilateral leg pain and swelling with redness and warmth to touch. Patient has dementia, alert to self currently which is baseline. Patient in wheelchair per family.   EMS vitals 156/72 HR 92 RR 18 99 on room air CBG 204

## 2024-03-23 ENCOUNTER — Telehealth: Payer: Self-pay | Admitting: Neurology

## 2024-03-23 ENCOUNTER — Ambulatory Visit: Admitting: Neurology

## 2024-03-23 NOTE — Telephone Encounter (Signed)
 PH: [phone]  Son [facility]) dropped off [employer] paper work to fill out. Forms placed in box.  He also wanted to know if Dr. Evonnie had any questions for him.

## 2024-03-23 NOTE — Telephone Encounter (Signed)
 Patient CX appointment for today and has not rescheduled. We will need to R/S his appointment before filling out FMLA paperwork please call when you get a chance

## 2024-03-29 ENCOUNTER — Emergency Department (HOSPITAL_COMMUNITY)

## 2024-03-29 ENCOUNTER — Encounter (HOSPITAL_COMMUNITY): Payer: Self-pay

## 2024-03-29 ENCOUNTER — Inpatient Hospital Stay (HOSPITAL_COMMUNITY)
Admission: EM | Admit: 2024-03-29 | Discharge: 2024-04-04 | DRG: 177 | Disposition: A | Attending: Internal Medicine | Admitting: Internal Medicine

## 2024-03-29 ENCOUNTER — Other Ambulatory Visit: Payer: Self-pay

## 2024-03-29 DIAGNOSIS — Z794 Long term (current) use of insulin: Secondary | ICD-10-CM

## 2024-03-29 DIAGNOSIS — W449XXA Unspecified foreign body entering into or through a natural orifice, initial encounter: Secondary | ICD-10-CM

## 2024-03-29 DIAGNOSIS — E109 Type 1 diabetes mellitus without complications: Secondary | ICD-10-CM | POA: Diagnosis present

## 2024-03-29 DIAGNOSIS — Z7984 Long term (current) use of oral hypoglycemic drugs: Secondary | ICD-10-CM

## 2024-03-29 DIAGNOSIS — R531 Weakness: Secondary | ICD-10-CM

## 2024-03-29 DIAGNOSIS — Z792 Long term (current) use of antibiotics: Secondary | ICD-10-CM

## 2024-03-29 DIAGNOSIS — Z9841 Cataract extraction status, right eye: Secondary | ICD-10-CM

## 2024-03-29 DIAGNOSIS — Z66 Do not resuscitate: Secondary | ICD-10-CM | POA: Diagnosis present

## 2024-03-29 DIAGNOSIS — Z1152 Encounter for screening for COVID-19: Secondary | ICD-10-CM

## 2024-03-29 DIAGNOSIS — J9601 Acute respiratory failure with hypoxia: Secondary | ICD-10-CM | POA: Diagnosis present

## 2024-03-29 DIAGNOSIS — Z888 Allergy status to other drugs, medicaments and biological substances status: Secondary | ICD-10-CM

## 2024-03-29 DIAGNOSIS — G20A1 Parkinson's disease without dyskinesia, without mention of fluctuations: Secondary | ICD-10-CM | POA: Diagnosis present

## 2024-03-29 DIAGNOSIS — Z881 Allergy status to other antibiotic agents status: Secondary | ICD-10-CM

## 2024-03-29 DIAGNOSIS — R509 Fever, unspecified: Principal | ICD-10-CM

## 2024-03-29 DIAGNOSIS — Z833 Family history of diabetes mellitus: Secondary | ICD-10-CM

## 2024-03-29 DIAGNOSIS — Z6828 Body mass index (BMI) 28.0-28.9, adult: Secondary | ICD-10-CM

## 2024-03-29 DIAGNOSIS — R1311 Dysphagia, oral phase: Secondary | ICD-10-CM | POA: Diagnosis present

## 2024-03-29 DIAGNOSIS — J690 Pneumonitis due to inhalation of food and vomit: Principal | ICD-10-CM | POA: Diagnosis present

## 2024-03-29 DIAGNOSIS — Z79899 Other long term (current) drug therapy: Secondary | ICD-10-CM

## 2024-03-29 DIAGNOSIS — Z8616 Personal history of COVID-19: Secondary | ICD-10-CM

## 2024-03-29 DIAGNOSIS — R339 Retention of urine, unspecified: Secondary | ICD-10-CM | POA: Diagnosis present

## 2024-03-29 DIAGNOSIS — Z880 Allergy status to penicillin: Secondary | ICD-10-CM

## 2024-03-29 DIAGNOSIS — R627 Adult failure to thrive: Secondary | ICD-10-CM | POA: Diagnosis present

## 2024-03-29 DIAGNOSIS — Z9842 Cataract extraction status, left eye: Secondary | ICD-10-CM

## 2024-03-29 DIAGNOSIS — Z87891 Personal history of nicotine dependence: Secondary | ICD-10-CM

## 2024-03-29 DIAGNOSIS — Z91041 Radiographic dye allergy status: Secondary | ICD-10-CM

## 2024-03-29 DIAGNOSIS — E785 Hyperlipidemia, unspecified: Secondary | ICD-10-CM | POA: Diagnosis present

## 2024-03-29 DIAGNOSIS — K5900 Constipation, unspecified: Secondary | ICD-10-CM | POA: Diagnosis present

## 2024-03-29 DIAGNOSIS — Z886 Allergy status to analgesic agent status: Secondary | ICD-10-CM

## 2024-03-29 DIAGNOSIS — E860 Dehydration: Secondary | ICD-10-CM | POA: Diagnosis present

## 2024-03-29 DIAGNOSIS — T17800A Unspecified foreign body in other parts of respiratory tract causing asphyxiation, initial encounter: Secondary | ICD-10-CM

## 2024-03-29 DIAGNOSIS — R4182 Altered mental status, unspecified: Secondary | ICD-10-CM

## 2024-03-29 DIAGNOSIS — F0280 Dementia in other diseases classified elsewhere without behavioral disturbance: Secondary | ICD-10-CM | POA: Diagnosis present

## 2024-03-29 DIAGNOSIS — Z961 Presence of intraocular lens: Secondary | ICD-10-CM | POA: Diagnosis present

## 2024-03-29 DIAGNOSIS — N179 Acute kidney failure, unspecified: Secondary | ICD-10-CM | POA: Diagnosis present

## 2024-03-29 DIAGNOSIS — I10 Essential (primary) hypertension: Secondary | ICD-10-CM | POA: Diagnosis present

## 2024-03-29 LAB — COMPREHENSIVE METABOLIC PANEL WITH GFR
ALT: 21 U/L (ref 0–44)
AST: 41 U/L (ref 15–41)
Albumin: 3.5 g/dL (ref 3.5–5.0)
Alkaline Phosphatase: 72 U/L (ref 38–126)
Anion gap: 13 (ref 5–15)
BUN: 17 mg/dL (ref 8–23)
CO2: 25 mmol/L (ref 22–32)
Calcium: 9.4 mg/dL (ref 8.9–10.3)
Chloride: 97 mmol/L — ABNORMAL LOW (ref 98–111)
Creatinine, Ser: 1.24 mg/dL (ref 0.61–1.24)
GFR, Estimated: 57 mL/min — ABNORMAL LOW
Glucose, Bld: 129 mg/dL — ABNORMAL HIGH (ref 70–99)
Potassium: 3.9 mmol/L (ref 3.5–5.1)
Sodium: 134 mmol/L — ABNORMAL LOW (ref 135–145)
Total Bilirubin: 0.6 mg/dL (ref 0.0–1.2)
Total Protein: 7 g/dL (ref 6.5–8.1)

## 2024-03-29 LAB — URINALYSIS, W/ REFLEX TO CULTURE (INFECTION SUSPECTED)
Bilirubin Urine: NEGATIVE
Glucose, UA: NEGATIVE mg/dL
Ketones, ur: 20 mg/dL — AB
Leukocytes,Ua: NEGATIVE
Nitrite: NEGATIVE
Protein, ur: NEGATIVE mg/dL
Specific Gravity, Urine: 1.015 (ref 1.005–1.030)
pH: 5 (ref 5.0–8.0)

## 2024-03-29 LAB — I-STAT VENOUS BLOOD GAS, ED
Acid-Base Excess: 1 mmol/L (ref 0.0–2.0)
Bicarbonate: 25.8 mmol/L (ref 20.0–28.0)
Calcium, Ion: 1.18 mmol/L (ref 1.15–1.40)
HCT: 34 % — ABNORMAL LOW (ref 39.0–52.0)
Hemoglobin: 11.6 g/dL — ABNORMAL LOW (ref 13.0–17.0)
O2 Saturation: 61 %
Potassium: 3.8 mmol/L (ref 3.5–5.1)
Sodium: 135 mmol/L (ref 135–145)
TCO2: 27 mmol/L (ref 22–32)
pCO2, Ven: 41.6 mmHg — ABNORMAL LOW (ref 44–60)
pH, Ven: 7.399 (ref 7.25–7.43)
pO2, Ven: 32 mmHg (ref 32–45)

## 2024-03-29 LAB — CBC WITH DIFFERENTIAL/PLATELET
Abs Immature Granulocytes: 0.05 10*3/uL (ref 0.00–0.07)
Basophils Absolute: 0 10*3/uL (ref 0.0–0.1)
Basophils Relative: 0 %
Eosinophils Absolute: 0 10*3/uL (ref 0.0–0.5)
Eosinophils Relative: 0 %
HCT: 35.1 % — ABNORMAL LOW (ref 39.0–52.0)
Hemoglobin: 11.7 g/dL — ABNORMAL LOW (ref 13.0–17.0)
Immature Granulocytes: 1 %
Lymphocytes Relative: 10 %
Lymphs Abs: 1 10*3/uL (ref 0.7–4.0)
MCH: 30.3 pg (ref 26.0–34.0)
MCHC: 33.3 g/dL (ref 30.0–36.0)
MCV: 90.9 fL (ref 80.0–100.0)
Monocytes Absolute: 0.8 10*3/uL (ref 0.1–1.0)
Monocytes Relative: 8 %
Neutro Abs: 7.7 10*3/uL (ref 1.7–7.7)
Neutrophils Relative %: 81 %
Platelets: 327 10*3/uL (ref 150–400)
RBC: 3.86 MIL/uL — ABNORMAL LOW (ref 4.22–5.81)
RDW: 13.5 % (ref 11.5–15.5)
WBC: 9.5 10*3/uL (ref 4.0–10.5)
nRBC: 0 % (ref 0.0–0.2)

## 2024-03-29 LAB — CREATININE, SERUM
Creatinine, Ser: 0.99 mg/dL (ref 0.61–1.24)
GFR, Estimated: >60 mL/min

## 2024-03-29 LAB — I-STAT CHEM 8, ED
BUN: 18 mg/dL (ref 8–23)
Calcium, Ion: 1.2 mmol/L (ref 1.15–1.40)
Chloride: 96 mmol/L — ABNORMAL LOW (ref 98–111)
Creatinine, Ser: 1.3 mg/dL — ABNORMAL HIGH (ref 0.61–1.24)
Glucose, Bld: 131 mg/dL — ABNORMAL HIGH (ref 70–99)
HCT: 36 % — ABNORMAL LOW (ref 39.0–52.0)
Hemoglobin: 12.2 g/dL — ABNORMAL LOW (ref 13.0–17.0)
Potassium: 3.8 mmol/L (ref 3.5–5.1)
Sodium: 135 mmol/L (ref 135–145)
TCO2: 25 mmol/L (ref 22–32)

## 2024-03-29 LAB — CBG MONITORING, ED
Glucose-Capillary: 124 mg/dL — ABNORMAL HIGH (ref 70–99)
Glucose-Capillary: 116 mg/dL — ABNORMAL HIGH (ref 70–99)
Glucose-Capillary: 110 mg/dL — ABNORMAL HIGH (ref 70–99)

## 2024-03-29 LAB — URINE DRUG SCREEN
Amphetamines: NEGATIVE
Barbiturates: NEGATIVE
Benzodiazepines: NEGATIVE
Cocaine: NEGATIVE
Fentanyl: NEGATIVE
Methadone Scn, Ur: NEGATIVE
Opiates: NEGATIVE
Tetrahydrocannabinol: NEGATIVE

## 2024-03-29 LAB — LIPASE, BLOOD: Lipase: 16 U/L (ref 11–51)

## 2024-03-29 LAB — CBC
HCT: 30.5 % — ABNORMAL LOW (ref 39.0–52.0)
Hemoglobin: 10.2 g/dL — ABNORMAL LOW (ref 13.0–17.0)
MCH: 30.3 pg (ref 26.0–34.0)
MCHC: 33.4 g/dL (ref 30.0–36.0)
MCV: 90.5 fL (ref 80.0–100.0)
Platelets: 289 10*3/uL (ref 150–400)
RBC: 3.37 MIL/uL — ABNORMAL LOW (ref 4.22–5.81)
RDW: 13.4 % (ref 11.5–15.5)
WBC: 11.2 10*3/uL — ABNORMAL HIGH (ref 4.0–10.5)
nRBC: 0 % (ref 0.0–0.2)

## 2024-03-29 LAB — I-STAT CG4 LACTIC ACID, ED: Lactic Acid, Venous: 1.6 mmol/L (ref 0.5–1.9)

## 2024-03-29 LAB — HEMOGLOBIN A1C
Hgb A1c MFr Bld: 6.8 % — ABNORMAL HIGH (ref 4.8–5.6)
Mean Plasma Glucose: 148.46 mg/dL

## 2024-03-29 LAB — PROTIME-INR
INR: 1.2 (ref 0.8–1.2)
Prothrombin Time: 16 s — ABNORMAL HIGH (ref 11.4–15.2)

## 2024-03-29 LAB — RESP PANEL BY RT-PCR (RSV, FLU A&B, COVID)  RVPGX2
Influenza A by PCR: NEGATIVE
Influenza B by PCR: NEGATIVE
Resp Syncytial Virus by PCR: POSITIVE — AB
SARS Coronavirus 2 by RT PCR: NEGATIVE

## 2024-03-29 LAB — TROPONIN T, HIGH SENSITIVITY
Troponin T High Sensitivity: 26 ng/L — ABNORMAL HIGH (ref 0–19)
Troponin T High Sensitivity: 30 ng/L — ABNORMAL HIGH (ref 0–19)

## 2024-03-29 MED ORDER — IPRATROPIUM-ALBUTEROL 0.5-2.5 (3) MG/3ML IN SOLN: 3.0000 mL | Freq: Four times a day (QID) | RESPIRATORY_TRACT | Status: DC | PRN

## 2024-03-29 MED ORDER — VANCOMYCIN HCL 2000 MG/400ML IV SOLN
2000.0000 mg | Freq: Once | INTRAVENOUS | Status: AC
  Administered 2024-03-29: 2000 mg via INTRAVENOUS
  Filled 2024-03-29: qty 400

## 2024-03-29 MED ORDER — SODIUM CHLORIDE 0.9 % IV SOLN
2.0000 g | Freq: Once | INTRAVENOUS | Status: AC
  Administered 2024-03-29: 2 g via INTRAVENOUS
  Filled 2024-03-29: qty 12.5

## 2024-03-29 MED ORDER — SODIUM CHLORIDE 0.9 % IV BOLUS
1000.0000 mL | Freq: Once | INTRAVENOUS | Status: AC
  Administered 2024-03-29: 1000 mL via INTRAVENOUS

## 2024-03-29 MED ORDER — SODIUM CHLORIDE 0.9 % IV SOLN
1.0000 g | INTRAVENOUS | Status: DC
  Administered 2024-03-29: 1 g via INTRAVENOUS
  Filled 2024-03-29: qty 10

## 2024-03-29 MED ORDER — ENOXAPARIN SODIUM 40 MG/0.4ML IJ SOSY
40.0000 mg | PREFILLED_SYRINGE | INTRAMUSCULAR | Status: DC
  Administered 2024-03-29 – 2024-04-04 (×7): 40 mg via SUBCUTANEOUS
  Filled 2024-03-29 (×7): qty 0.4

## 2024-03-29 MED ORDER — ACETAMINOPHEN 325 MG PO TABS
650.0000 mg | ORAL_TABLET | Freq: Four times a day (QID) | ORAL | Status: DC | PRN
  Administered 2024-03-31: 650 mg via ORAL
  Filled 2024-03-29 (×2): qty 2

## 2024-03-29 MED ORDER — SODIUM CHLORIDE 0.9 % IV SOLN: INTRAVENOUS | Status: DC

## 2024-03-29 MED ORDER — ACETAMINOPHEN 650 MG RE SUPP
650.0000 mg | Freq: Once | RECTAL | Status: AC
  Administered 2024-03-29: 650 mg via RECTAL
  Filled 2024-03-29: qty 1

## 2024-03-29 MED ORDER — SODIUM CHLORIDE 0.9 % IV SOLN: Freq: Once | INTRAVENOUS | Status: AC

## 2024-03-29 MED ORDER — ACETAMINOPHEN 10 MG/ML IV SOLN
1000.0000 mg | Freq: Four times a day (QID) | INTRAVENOUS | Status: AC | PRN
  Administered 2024-03-29: 1000 mg via INTRAVENOUS
  Filled 2024-03-29: qty 100

## 2024-03-29 MED ORDER — CARBIDOPA-LEVODOPA 25-100 MG PO TABS
2.0000 | ORAL_TABLET | Freq: Three times a day (TID) | ORAL | Status: DC
  Administered 2024-03-30 – 2024-04-04 (×16): 2 via ORAL
  Filled 2024-03-29 (×20): qty 2

## 2024-03-29 MED ORDER — SODIUM CHLORIDE 0.9 % IV SOLN
100.0000 mg | Freq: Two times a day (BID) | INTRAVENOUS | Status: DC
  Administered 2024-03-29: 100 mg via INTRAVENOUS
  Filled 2024-03-29 (×2): qty 100

## 2024-03-29 MED ORDER — INSULIN ASPART 100 UNIT/ML IJ SOLN
0.0000 [IU] | INTRAMUSCULAR | Status: DC
  Administered 2024-03-29: 1 [IU] via SUBCUTANEOUS
  Administered 2024-03-30 – 2024-04-01 (×5): 2 [IU] via SUBCUTANEOUS
  Administered 2024-04-01: 3 [IU] via SUBCUTANEOUS
  Administered 2024-04-01: 1 [IU] via SUBCUTANEOUS
  Administered 2024-04-01: 2 [IU] via SUBCUTANEOUS
  Administered 2024-04-02: 1 [IU] via SUBCUTANEOUS
  Administered 2024-04-02: 2 [IU] via SUBCUTANEOUS
  Administered 2024-04-02 (×2): 1 [IU] via SUBCUTANEOUS
  Administered 2024-04-02: 2 [IU] via SUBCUTANEOUS
  Administered 2024-04-02: 1 [IU] via SUBCUTANEOUS
  Administered 2024-04-03: 2 [IU] via SUBCUTANEOUS
  Administered 2024-04-03: 1 [IU] via SUBCUTANEOUS
  Administered 2024-04-03: 2 [IU] via SUBCUTANEOUS
  Administered 2024-04-03 – 2024-04-04 (×2): 1 [IU] via SUBCUTANEOUS
  Administered 2024-04-04: 2 [IU] via SUBCUTANEOUS
  Administered 2024-04-04 (×2): 1 [IU] via SUBCUTANEOUS
  Filled 2024-03-29: qty 1
  Filled 2024-03-29 (×2): qty 2
  Filled 2024-03-29 (×4): qty 1
  Filled 2024-03-29: qty 2
  Filled 2024-03-29: qty 1
  Filled 2024-03-29: qty 2
  Filled 2024-03-29 (×4): qty 1
  Filled 2024-03-29: qty 2
  Filled 2024-03-29 (×6): qty 1

## 2024-03-29 NOTE — ED Notes (Signed)
 Pt has two (2) belonging bags. NT let the son have the bags

## 2024-03-29 NOTE — H&P (Signed)
 " History and Physical    Patient: Christian Morgan FMW:[id number] DOB: 02-07-1941 DOA: 03/29/2024 DOS: the patient was seen and examined on 03/29/2024 PCP: Rexanne Ingle, MD  Patient coming from: Home  Chief Complaint:  Chief Complaint  Patient presents with   Altered Mental Status   HPI: Christian Morgan is a 84 y.o. male with medical history significant of dementia, Parkinson's disease, dysphagia, DM2 (A1c 7.2 in 08/2022), HLD, GERD, and last [facility] admission in 08/2022 w/ COVID (tx w/ remdesivir  and dexamethasone ) who p/w AHRF iso aspiration pneumonitis.  The patient was brought in by a family member (son) due to concerns of fever and poor responsiveness. The symptoms began after the patient took erythromycin, which was prescribed by his primary doctor for a sinus condition. The son, arrived at his normal time to care for his dad, and noted that he was sleeping more heavily than normal. He checked his glass of water, which had been untouched since yesterday evening, and attempted to get his father to drink. When he found his dad difficult to arouse, he activated EMS  and pt was BIBA to ED for evaluation. Of note, pt reports that his entire family has been ill recently from what he thinks is a viral illness.  In the ED, pt febrile (102.9 deg F rectally), tachycardic, and tachypneic w/ hypoxia (requiring 3-4L per [place] in ED). Labs notable for Cr 1.3 (baseline Cr 1.0-1.1). CTH w/ NAICA. CT chest showed bilateral lower lobe predominant endobronchial debris and peribronchovascular ground-glass/consolidation, right greater than left, suspicious for aspiration, and moderate to severe stool burden with probable fecal impaction. EDP started IV cefepime /vancomycin , IVF and requested medicine admission.   Review of Systems: As mentioned in the history of present illness. All other systems reviewed and are negative. Past Medical History:  Diagnosis Date   Allergic rhinitis    Chronic headache     Complication of anesthesia    after eye surgery, had trouble catching breath   Daytime somnolence 01/05/2017   Essential hypertension 10/31/2009   Hard of hearing    Hyperglycemia due to diabetes mellitus 09/20/2017   Hyperlipidemia    Hypertension    Major neurocognitive disorder, unclear etiology 08/02/2019   OSA (obstructive sleep apnea) 2011   s/p septoplasty; was supposed to wear cpap but better after septoplasty   Type I diabetes mellitus 10/31/2009   Past Surgical History:  Procedure Laterality Date   BACK SURGERY     COLONOSCOPY     EYE SURGERY Bilateral    cataract surgery with lens implants   NASAL SEPTOPLASTY W/ TURBINOPLASTY Bilateral 01/11/2019   Procedure: NASAL SEPTOPLASTY;  Surgeon: Karis Clunes, MD;  Location: [facility] OR;  Service: ENT;  Laterality: Bilateral;   S/p Crani for CHI     subdural hematoma    SHOULDER SURGERY  2009   Right   TURBINATE REDUCTION Bilateral 01/11/2019   Procedure: Turbinate Reduction;  Surgeon: Karis Clunes, MD;  Location: [facility] OR;  Service: ENT;  Laterality: Bilateral;   Social History:  reports that he quit smoking about 15 years ago. His smoking use included cigarettes and cigars. He smoked an average of 0.1 packs per day. He has never used smokeless tobacco. He reports that he does not currently use alcohol. He reports that he does not use drugs.  Allergies[1]  Family History  Problem Relation Age of Onset   Stroke Sister    Lung cancer Brother    Diabetes Mother    Depression Mother  Alcohol abuse Father     Prior to Admission medications  Medication Sig Start Date End Date Taking? Authorizing Provider  acetaminophen  (TYLENOL ) 500 MG tablet Take 500 mg by mouth every 6 (six) hours as needed for moderate pain or headache.    [provider]  carbidopa -levodopa  (SINEMET  IR) 25-100 MG tablet TAKE 2 TABLETS AT 8AM, 2 AT NOON, 1 AT 4PM 02/28/24   Tat, Rebecca S, DO  cephALEXin  (KEFLEX ) 500 MG capsule Take 1 capsule (500 mg total) by  mouth 4 (four) times daily. 03/22/24   Nivia Colon, PA-C  donepezil  (ARICEPT ) 10 MG tablet TAKE 1 TABLET BY MOUTH EVERYDAY AT BEDTIME 01/04/24   Tat, Asberry RAMAN, DO  escitalopram  (LEXAPRO ) 10 MG tablet Take 5 mg by mouth daily.  12/08/19   [provider]  feeding supplement (ENSURE ENLIVE / ENSURE PLUS) LIQD Take 237 mLs by mouth 3 (three) times daily between meals. 08/31/22   Rosario Leatrice FERNS, MD  Ipratropium-Albuterol  (COMBIVENT) 20-100 MCG/ACT AERS respimat Inhale 1 puff into the lungs every 6 (six) hours as needed for wheezing or shortness of breath. 08/31/22   Rosario Leatrice FERNS, MD  levocetirizine (XYZAL ) 5 MG tablet Take 5 mg by mouth daily as needed for allergies.    [provider]  losartan  (COZAAR ) 100 MG tablet Take 100 mg by mouth daily.    [provider]  metFORMIN (GLUCOPHAGE-XR) 500 MG 24 hr tablet Take 1,000 mg by mouth 2 (two) times daily.    [provider]  rosuvastatin  (CRESTOR ) 5 MG tablet Take 5 mg by mouth every evening.    [provider]  vitamin B-12 (CYANOCOBALAMIN ) 1000 MCG tablet Take 1,000 mcg by mouth daily.    [provider]    Physical Exam: Vitals:   03/29/24 1000 03/29/24 1030 03/29/24 1115 03/29/24 1200  BP: (!) 140/67 (!) 125/57 (!) 143/61 135/66  Pulse: (!) 115 97 98 96  Resp: 15 (!) 23 (!) 25 (!) 25  Temp: (!) 102.4 F (39.1 C) (!) 101.7 F (38.7 C) (!) 101.2 F (38.4 C) (!) 101.2 F (38.4 C)  TempSrc:      SpO2: 98% 99% 98% 99%  Weight:       General: Alert, oriented x3, resting comfortably in no acute distress Respiratory: Diffuse rhonchi; no wheezing Cardiovascular: Regular rate and rhythm w/o m/r/g Abdomen: Soft, nontender, nondistended. Positive bowel sounds   Data Reviewed:  Lab Results  Component Value Date   WBC 9.5 03/29/2024   HGB 12.2 (L) 03/29/2024   HCT 36.0 (L) 03/29/2024   MCV 90.9 03/29/2024   PLT 327 03/29/2024   Lab Results  Component Value Date   GLUCOSE 131 (H)  03/29/2024   CALCIUM  9.4 03/29/2024   NA 135 03/29/2024   K 3.8 03/29/2024   CO2 25 03/29/2024   CL 96 (L) 03/29/2024   BUN 18 03/29/2024   CREATININE 1.30 (H) 03/29/2024   Lab Results  Component Value Date   ALT 21 03/29/2024   AST 41 03/29/2024   ALKPHOS 72 03/29/2024   BILITOT 0.6 03/29/2024   Lab Results  Component Value Date   INR 1.2 03/29/2024   INR 1.1 08/27/2022   Radiology: CT CHEST ABDOMEN PELVIS WO CONTRAST Result Date: 03/29/2024 CLINICAL DATA:  Sepsis, altered mental status. EXAM: CT CHEST, ABDOMEN AND PELVIS WITHOUT CONTRAST TECHNIQUE: Multidetector CT imaging of the chest, abdomen and pelvis was performed following the standard protocol without IV contrast. RADIATION DOSE REDUCTION: This exam was performed  according to the departmental dose-optimization program which includes automated exposure control, adjustment of the mA and/or kV according to patient size and/or use of iterative reconstruction technique. COMPARISON:  CT abdomen pelvis 10/07/2022. FINDINGS: CT CHEST FINDINGS Cardiovascular: Atherosclerotic calcification of the aorta, aortic valve and coronary arteries. Heart is at the upper limits of normal in size. No pericardial effusion. Mediastinum/Nodes: Thoracic inlet lymph nodes are not enlarged by CT size criteria. No pathologically enlarged mediastinal or axillary lymph nodes. Hilar regions are difficult to definitively evaluate without IV contrast. Air in the esophagus can be seen with dysmotility. Lungs/Pleura: Image quality is degraded by respiratory motion. Peribronchovascular ground-glass and consolidation in the lower lobes, right greater than left, with endobronchial debris in the right lower lobe. Additional scattered areas of ground-glass in the left upper lobe. No pleural fluid. Airway is unremarkable. Musculoskeletal: Degenerative changes in the spine. CT ABDOMEN PELVIS FINDINGS Hepatobiliary: Liver is grossly unremarkable. Small gallstones. No biliary  ductal dilatation. Pancreas: Negative. Spleen: Negative. Adrenals/Urinary Tract: Adrenal glands are unremarkable. Right renal stones. Low-attenuation lesion in the right kidney. No specific follow-up necessary. Kidneys are otherwise unremarkable. Ureters are decompressed. Foley catheter is seen in a decompressed bladder. Stomach/Bowel: Small hiatal hernia. Stomach and small bowel are otherwise unremarkable. 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 Appendix is not readily visualized. Moderate to severe stool burden. Large amount of stool in the rectum. Vascular/Lymphatic: Atherosclerotic calcification of the aorta. No pathologically enlarged lymph nodes. Reproductive: Prostate is enlarged. Other: Small bilateral inguinal hernias contain fat.  No free fluid. Musculoskeletal: Degenerative changes in the spine. IMPRESSION: 1. Bilateral lower lobe predominant endobronchial debris and peribronchovascular ground-glass/consolidation, right greater than left, suspicious for aspiration. 2. Moderate to severe stool burden with probable fecal impaction. 3. Ileocecal valve lipoma. 4. Cholelithiasis. 5. Right renal stones. 6. Enlarged prostate. 7. Aortic atherosclerosis (ICD10-I70.0). Coronary artery calcification. Electronically Signed   By: Newell Eke M.D.   On: 03/29/2024 10:23   CT Head Wo Contrast Result Date: 03/29/2024 EXAM: CT HEAD WITHOUT CONTRAST 03/29/2024 09:56:03 AM TECHNIQUE: CT of the head was performed without the administration of intravenous contrast. Automated exposure control, iterative reconstruction, and/or weight based adjustment of the mA/kV was utilized to reduce the radiation dose to as low as reasonably achievable. COMPARISON: Head CT 02/10/2024. CLINICAL HISTORY: Mental status change, unknown cause. FINDINGS: BRAIN AND VENTRICLES: There is no evidence of an acute infarct, intracranial hemorrhage, mass, midline shift, hydrocephalus, or extra-axial fluid collection. There is mild to moderate cerebral  atrophy. Patchy hypodensities in the cerebral white matter bilaterally are unchanged and nonspecific but compatible with mild to moderate chronic small vessel ischemic disease. A chronic lacunar infarct in the left basal ganglia is unchanged. Calcified atherosclerosis at the skull base. ORBITS: Bilateral cataract extraction. SINUSES: Moderate mucosal thickening in the paranasal sinuses, greatest in the ethmoid air cells bilaterally. Clear mastoid air cells. SOFT TISSUES AND SKULL: Left parietal craniotomy. No acute soft tissue abnormality. No skull fracture. IMPRESSION: 1. No acute intracranial abnormality. 2. Mild to moderate cerebral atrophy and chronic small vessel ischemic disease. Electronically signed by: Dasie Hamburg MD 03/29/2024 10:07 AM EST RP Workstation: [id number]   DG Chest Port 1 View Result Date: 03/29/2024 CLINICAL DATA:  Fever, altered mental status EXAM: PORTABLE CHEST 1 VIEW COMPARISON:  None Available. FINDINGS: The heart size and mediastinal contours are within normal limits. Both lungs are clear. The visualized skeletal structures are unremarkable. IMPRESSION: No active disease. Electronically Signed   By: Wilkie Karalee HERO.D.  On: 03/29/2024 09:48    Assessment and Plan: 79M h/o dementia, Parkinson's disease, dysphagia, DM2 (A1c 7.2 in 08/2022), HTN, HLD, DM2, GERD, and last [facility] admission in 08/2022 w/ COVID (tx w/ remdesivir  and dexamethasone ) who p/w AHRF iso aspiration pneumonitis.  AHRF iso aspiration pneumonitis H/o dysphagia CT w/ bibasilar consolidation R>L c/f aspiration -SLP consulted; apprec eval/recs -IV CTX 1g daily + IV doxycycline  BID for now given age and critical presentation -MIVF: NS at 100cc/h for now -F/u blood cultures -NPO w/ sips for meds for now pending SLP eval  Parkinson's disease -PTA Sinemet  2 tabs TID pending SLP eval  HTN -HOLD pta losartan   HLD -HOLD pta Crestor   DM2 -HOLD pta metformin -SSI q4h for now while NPO    Advance Care  Planning:   Code Status: Full Code   Consults: N/A  Family Communication: Son  Severity of Illness: The appropriate patient status for this patient is INPATIENT. Inpatient status is judged to be reasonable and necessary in order to provide the required intensity of service to ensure the patient's safety. The patient's presenting symptoms, physical exam findings, and initial radiographic and laboratory data in the context of their chronic comorbidities is felt to place them at high risk for further clinical deterioration. Furthermore, it is not anticipated that the patient will be medically stable for discharge from the hospital within 2 midnights of admission.   * I certify that at the point of admission it is my clinical judgment that the patient will require inpatient hospital care spanning beyond 2 midnights from the point of admission due to high intensity of service, high risk for further deterioration and high frequency of surveillance required.*   ------- I spent 57 minutes reviewing previous notes, at the bedside counseling/discussing the treatment plan, and performing clinical documentation.  Author: Marsha Ada, MD 03/29/2024 12:31 PM  For on call review [email or web address].     [1]  Allergies Allergen Reactions   Lipitor [Atorvastatin] Other (See Comments)    Increased liver enzymes and joint pain from numerous statins   Penicillins Hives and Rash   Amitriptyline Hcl Other (See Comments)    Unknown reaction   Cyclobenzaprine  Other (See Comments)    Burning in stomach   Gabapentin Other (See Comments)    Dizziness    Other Other (See Comments)    [place]: Terrible migraines.   Zetia [Ezetimibe] Other (See Comments)    Joint pain   Aspirin Other (See Comments)    Upset stomach   Doxycycline  Hives    Reaction took 4-5 days   Iodinated Contrast Media Hives    Developed hives after contrast 50 years ago when I had a subdural hematoma at age 62.  Tolerates now with  Benadryl  50mg  PO one hour before contrast.   Levofloxacin Hives    Delayed allergic reaction   "

## 2024-03-29 NOTE — ED Notes (Signed)
 Fall band on, fall sign on door rails up and bed lowered. Patients family in room. No socks put on patient due to swelling and redness on both legs.

## 2024-03-29 NOTE — ED Triage Notes (Signed)
 Pt BIB [facility] for AMS that started yesterday. Per family, pt has had a fever . Pt currently on PO antibiotics fro cellulitis. GCS 11, per EMS.    160/100 HR 110 CBG 135 20G L AC 600cc of LR

## 2024-03-29 NOTE — Progress Notes (Signed)
 ED Pharmacy Antibiotic Sign Off An antibiotic consult was received from an ED provider for cefepime  and vancomycin  per pharmacy dosing for sepsis. A chart review was completed to assess appropriateness.   The following one time order(s) were placed:  Cefepime  2g Vancomycin  2g  Further antibiotic and/or antibiotic pharmacy consults should be ordered by the admitting provider if indicated.   Thank you for allowing pharmacy to be a part of this patient's care.   Leonor GORMAN Bash, [facility]  Clinical Pharmacist 03/29/24 9:09 AM

## 2024-03-29 NOTE — ED Provider Notes (Signed)
 " [place] EMERGENCY DEPARTMENT AT [facility] Provider Note   CSN: [id number] Arrival date & time: 03/29/24  9157     Patient presents with: Altered Mental Status   Christian Morgan is a 84 y.o. male.   89 male with prior medical history as detailed below presents for evaluation.  Patient with fever and increased confusion x 24 hours.  Patient is currently on oral antibiotics to treat possible cellulitis of his lower extremities -Keflex .  EMS reports that the family noted fever yesterday.  Patient became more confused than his baseline.  Patient with prior medical history including type 1 diabetes, dementia, hypertension.  Patient is notably hypertensive, tachycardic during transport.  Patient is not hypoxic on room air.  Patient CBG was 135 with EMS.  Patient is alert but unable to provide details of recent illness.  The history is provided by the patient and medical records.       Prior to Admission medications  Medication Sig Start Date End Date Taking? Authorizing Provider  acetaminophen  (TYLENOL ) 500 MG tablet Take 500 mg by mouth every 6 (six) hours as needed for moderate pain or headache.    [provider]  carbidopa -levodopa  (SINEMET  IR) 25-100 MG tablet TAKE 2 TABLETS AT 8AM, 2 AT NOON, 1 AT 4PM 02/28/24   Tat, Rebecca S, DO  cephALEXin  (KEFLEX ) 500 MG capsule Take 1 capsule (500 mg total) by mouth 4 (four) times daily. 03/22/24   Nivia Colon, PA-C  donepezil  (ARICEPT ) 10 MG tablet TAKE 1 TABLET BY MOUTH EVERYDAY AT BEDTIME 01/04/24   Tat, Asberry RAMAN, DO  escitalopram  (LEXAPRO ) 10 MG tablet Take 5 mg by mouth daily.  12/08/19   [provider]  feeding supplement (ENSURE ENLIVE / ENSURE PLUS) LIQD Take 237 mLs by mouth 3 (three) times daily between meals. 08/31/22   Rosario Leatrice FERNS, MD  Ipratropium-Albuterol  (COMBIVENT) 20-100 MCG/ACT AERS respimat Inhale 1 puff into the lungs every 6 (six) hours as needed for wheezing or shortness of  breath. 08/31/22   Rosario Leatrice FERNS, MD  levocetirizine (XYZAL ) 5 MG tablet Take 5 mg by mouth daily as needed for allergies.    [provider]  losartan  (COZAAR ) 100 MG tablet Take 100 mg by mouth daily.    [provider]  metFORMIN (GLUCOPHAGE-XR) 500 MG 24 hr tablet Take 1,000 mg by mouth 2 (two) times daily.    [provider]  rosuvastatin  (CRESTOR ) 5 MG tablet Take 5 mg by mouth every evening.    [provider]  vitamin B-12 (CYANOCOBALAMIN ) 1000 MCG tablet Take 1,000 mcg by mouth daily.    [provider]    Allergies: Lipitor [atorvastatin], Penicillins, Amitriptyline hcl, Cyclobenzaprine , Gabapentin, Other, Zetia [ezetimibe], Aspirin, Doxycycline , Iodinated contrast media, and Levofloxacin    Review of Systems  Unable to perform ROS: Dementia    Updated Vital Signs BP (!) 141/83   Pulse (!) 114   Temp (!) 102.9 F (39.4 C) (Rectal)   Resp 18   Wt 83.9 kg   SpO2 100%   BMI 28.13 kg/m   Physical Exam Vitals and nursing note reviewed.  Constitutional:      General: He is not in acute distress.    Appearance: He is well-developed.     Comments: Alert, ill-appearing, demented, able to tell me his name but otherwise unable to provide significant information with questioning.  HENT:     Head: Normocephalic and atraumatic.     Mouth/Throat:  Mouth: Mucous membranes are moist.  Eyes:     Extraocular Movements: Extraocular movements intact.     Conjunctiva/sclera: Conjunctivae normal.     Pupils: Pupils are equal, round, and reactive to light.  Cardiovascular:     Rate and Rhythm: Normal rate and regular rhythm.     Heart sounds: Normal heart sounds. No murmur heard. Pulmonary:     Effort: Pulmonary effort is normal. No respiratory distress.     Breath sounds: Normal breath sounds.  Abdominal:     General: Abdomen is flat. There is no distension.     Palpations: Abdomen is soft.     Tenderness: There is no abdominal  tenderness.  Musculoskeletal:        General: No swelling or deformity. Normal range of motion.     Cervical back: Normal range of motion and neck supple.  Skin:    General: Skin is warm and dry.     Capillary Refill: Capillary refill takes less than 2 seconds.  Neurological:     General: No focal deficit present.     Mental Status: He is alert. Mental status is at baseline.     Comments: Alert but confused, oriented to person only.  Moves all 4 extremities equally.  Psychiatric:        Mood and Affect: Mood normal.     (all labs ordered are listed, but only abnormal results are displayed) Labs Reviewed  CULTURE, BLOOD (ROUTINE X 2)  CULTURE, BLOOD (ROUTINE X 2)  RESP PANEL BY RT-PCR (RSV, FLU A&B, COVID)  RVPGX2  CBC WITH DIFFERENTIAL/PLATELET  LIPASE, BLOOD  COMPREHENSIVE METABOLIC PANEL WITH GFR  URINALYSIS, W/ REFLEX TO CULTURE (INFECTION SUSPECTED)  URINE DRUG SCREEN  PROTIME-INR  I-STAT CG4 LACTIC ACID, ED  I-STAT CHEM 8, ED  I-STAT VENOUS BLOOD GAS, ED  TROPONIN T, HIGH SENSITIVITY    EKG: None  Radiology: No results found.   Procedures   Medications Ordered in the ED  acetaminophen  (TYLENOL ) suppository 650 mg (has no administration in time range)  sodium chloride  0.9 % bolus 1,000 mL (has no administration in time range)                                    Medical Decision Making Patient is demented at baseline, now presents with fever, increased confusion.  Family reports that the patient has been unable to walk today secondary to weakness associated with his fever.  Family reports multiple other family members with upper respiratory symptoms consistent with viral syndromes.  However, patient is significantly weak with temperature of 102.  Broad-spectrum antibiotics administered for possible bacterial infection.  CT imaging of the lung suggest possible aspiration with right greater than left consolidations.  Patient would benefit from admission for  further workup and treatment.  Hospitalist service is aware of case.   Amount and/or Complexity of Data Reviewed Labs: ordered. Radiology: ordered.  Risk OTC drugs. Prescription drug management. Decision regarding hospitalization.        Final diagnoses:  Fever, unspecified fever cause  Weakness  Aspiration into lower respiratory tract, initial encounter  Altered mental status, unspecified altered mental status type  Dehydration    ED Discharge Orders     None          Laurice Maude BROCKS, MD 03/29/24 1121  "

## 2024-03-30 ENCOUNTER — Encounter (HOSPITAL_COMMUNITY): Payer: Self-pay | Admitting: Hospitalist

## 2024-03-30 ENCOUNTER — Observation Stay (HOSPITAL_COMMUNITY)

## 2024-03-30 DIAGNOSIS — N179 Acute kidney failure, unspecified: Secondary | ICD-10-CM | POA: Diagnosis present

## 2024-03-30 DIAGNOSIS — Z66 Do not resuscitate: Secondary | ICD-10-CM | POA: Diagnosis present

## 2024-03-30 DIAGNOSIS — G20A1 Parkinson's disease without dyskinesia, without mention of fluctuations: Secondary | ICD-10-CM | POA: Diagnosis present

## 2024-03-30 DIAGNOSIS — Z1152 Encounter for screening for COVID-19: Secondary | ICD-10-CM | POA: Diagnosis not present

## 2024-03-30 DIAGNOSIS — F0280 Dementia in other diseases classified elsewhere without behavioral disturbance: Secondary | ICD-10-CM | POA: Diagnosis present

## 2024-03-30 DIAGNOSIS — E860 Dehydration: Secondary | ICD-10-CM | POA: Diagnosis present

## 2024-03-30 DIAGNOSIS — Z87891 Personal history of nicotine dependence: Secondary | ICD-10-CM | POA: Diagnosis not present

## 2024-03-30 DIAGNOSIS — Z794 Long term (current) use of insulin: Secondary | ICD-10-CM | POA: Diagnosis not present

## 2024-03-30 DIAGNOSIS — R339 Retention of urine, unspecified: Secondary | ICD-10-CM | POA: Diagnosis present

## 2024-03-30 DIAGNOSIS — E109 Type 1 diabetes mellitus without complications: Secondary | ICD-10-CM | POA: Diagnosis present

## 2024-03-30 DIAGNOSIS — K5900 Constipation, unspecified: Secondary | ICD-10-CM | POA: Diagnosis present

## 2024-03-30 DIAGNOSIS — J9601 Acute respiratory failure with hypoxia: Secondary | ICD-10-CM

## 2024-03-30 DIAGNOSIS — R1311 Dysphagia, oral phase: Secondary | ICD-10-CM | POA: Diagnosis present

## 2024-03-30 DIAGNOSIS — Z9841 Cataract extraction status, right eye: Secondary | ICD-10-CM | POA: Diagnosis not present

## 2024-03-30 DIAGNOSIS — Z961 Presence of intraocular lens: Secondary | ICD-10-CM | POA: Diagnosis present

## 2024-03-30 DIAGNOSIS — R627 Adult failure to thrive: Secondary | ICD-10-CM | POA: Diagnosis present

## 2024-03-30 DIAGNOSIS — I10 Essential (primary) hypertension: Secondary | ICD-10-CM | POA: Diagnosis present

## 2024-03-30 DIAGNOSIS — E785 Hyperlipidemia, unspecified: Secondary | ICD-10-CM | POA: Diagnosis present

## 2024-03-30 DIAGNOSIS — Z8616 Personal history of COVID-19: Secondary | ICD-10-CM | POA: Diagnosis not present

## 2024-03-30 DIAGNOSIS — Z7984 Long term (current) use of oral hypoglycemic drugs: Secondary | ICD-10-CM | POA: Diagnosis not present

## 2024-03-30 DIAGNOSIS — Z833 Family history of diabetes mellitus: Secondary | ICD-10-CM | POA: Diagnosis not present

## 2024-03-30 DIAGNOSIS — Z9842 Cataract extraction status, left eye: Secondary | ICD-10-CM | POA: Diagnosis not present

## 2024-03-30 DIAGNOSIS — Z880 Allergy status to penicillin: Secondary | ICD-10-CM | POA: Diagnosis not present

## 2024-03-30 DIAGNOSIS — J690 Pneumonitis due to inhalation of food and vomit: Secondary | ICD-10-CM | POA: Diagnosis present

## 2024-03-30 LAB — CBC
HCT: 30.2 % — ABNORMAL LOW (ref 39.0–52.0)
Hemoglobin: 10.1 g/dL — ABNORMAL LOW (ref 13.0–17.0)
MCH: 30.1 pg (ref 26.0–34.0)
MCHC: 33.4 g/dL (ref 30.0–36.0)
MCV: 90.1 fL (ref 80.0–100.0)
Platelets: 272 10*3/uL (ref 150–400)
RBC: 3.35 MIL/uL — ABNORMAL LOW (ref 4.22–5.81)
RDW: 13.7 % (ref 11.5–15.5)
WBC: 12 10*3/uL — ABNORMAL HIGH (ref 4.0–10.5)
nRBC: 0 % (ref 0.0–0.2)

## 2024-03-30 LAB — BASIC METABOLIC PANEL WITH GFR
Anion gap: 10 (ref 5–15)
BUN: 17 mg/dL (ref 8–23)
CO2: 23 mmol/L (ref 22–32)
Calcium: 8.7 mg/dL — ABNORMAL LOW (ref 8.9–10.3)
Chloride: 105 mmol/L (ref 98–111)
Creatinine, Ser: 0.99 mg/dL (ref 0.61–1.24)
GFR, Estimated: >60 mL/min
Glucose, Bld: 89 mg/dL (ref 70–99)
Potassium: 3.6 mmol/L (ref 3.5–5.1)
Sodium: 138 mmol/L (ref 135–145)

## 2024-03-30 LAB — GLUCOSE, CAPILLARY
Glucose-Capillary: 119 mg/dL — ABNORMAL HIGH (ref 70–99)
Glucose-Capillary: 166 mg/dL — ABNORMAL HIGH (ref 70–99)
Glucose-Capillary: 74 mg/dL (ref 70–99)
Glucose-Capillary: 81 mg/dL (ref 70–99)
Glucose-Capillary: 83 mg/dL (ref 70–99)
Glucose-Capillary: 95 mg/dL (ref 70–99)

## 2024-03-30 MED ORDER — DEXTROSE IN LACTATED RINGERS 5 % IV SOLN: INTRAVENOUS | Status: DC

## 2024-03-30 MED ORDER — POLYETHYLENE GLYCOL 3350 17 G PO PACK
17.0000 g | PACK | Freq: Every day | ORAL | Status: DC
  Administered 2024-03-30: 17 g via ORAL
  Filled 2024-03-30 (×4): qty 1

## 2024-03-30 MED ORDER — SODIUM CHLORIDE 0.9 % IV SOLN
2.0000 g | INTRAVENOUS | Status: DC
  Administered 2024-03-30 – 2024-03-31 (×2): 2 g via INTRAVENOUS
  Filled 2024-03-30 (×2): qty 20

## 2024-03-30 MED ORDER — CHLORHEXIDINE GLUCONATE CLOTH 2 % EX PADS
6.0000 | MEDICATED_PAD | Freq: Every day | CUTANEOUS | Status: DC
  Administered 2024-03-30 – 2024-03-31 (×2): 6 via TOPICAL

## 2024-03-30 MED ORDER — SENNOSIDES-DOCUSATE SODIUM 8.6-50 MG PO TABS
1.0000 | ORAL_TABLET | Freq: Two times a day (BID) | ORAL | Status: DC
  Administered 2024-03-30 – 2024-04-04 (×6): 1 via ORAL
  Filled 2024-03-30 (×9): qty 1

## 2024-03-30 NOTE — Procedures (Signed)
 Modified Barium Swallow Study  Patient Details  Name: Jaxsen Bernhart MRN: [id number] Date of Birth: 03/30/1940  Today's Date: 03/30/2024  Modified Barium Swallow completed.  Full report located under Chart Review in the Imaging Section.  History of Present Illness Zymarion Favorite Deandre Brannan is a 84 y.o. male who p/w AHRF iso aspiration pneumonitis.  The patient was brought in by a family member (son) due to concerns of fever and poor responsiveness. Recent suspected viral illness in family. Chest CT 1/28:  Bilateral lower lobe predominant endobronchial debris and  peribronchovascular ground-glass/consolidation, right greater than  left, suspicious for aspiration.  Seen by ST for clinical swallow management July 2024 with recs for regular diet with thin liquids.  Pt with medical history significant of dementia, Parkinson's disease, dysphagia, DM2 (A1c 7.2 in 08/2022), HLD, GERD, and last [facility] admission in 08/2022 w/ COVID (tx w/ remdesivir  and dexamethasone ).   Clinical Impression Rec: dys 1 (puree), nectar thick liquids. SLP will follow. Patient presents with a moderate primarily oral phase dysphagia as per this MBS with one instance of silent aspiration (PAS 8) and one instance of sensed aspiration (PAS 7) with thin liquids and flash penetration (PAS 2) with nectar thick liquids. He exhibited impaired oral control and manipulation of boluses with repetive lingual movements, leading to swallow initiation delays to level of pyriform sinus with all liquids. Anterior hyoid excursion, laryngeal elevation and laryngeal vestibule closure were all complete but mistiming of laryngeal vestibule closure resulted in intermittent aspiration of thin liquids. Trace to mild amount of pyriform and tongue base residuals remained s/p initial swallow, no retrograde movement of barium through or below PES were observed. Factors that may increase risk of adverse event in presence of aspiration Noe & Lianne 2021): Frail  or deconditioned;Poor general health and/or compromised immunity;Respiratory or GI disease  DIGEST Swallow Severity Rating*  Safety: 2  Efficiency:0  Overall Pharyngeal Swallow Severity: 2 1: mild; 2: moderate; 3: severe; 4: profound  *The Dynamic Imaging Grade of Swallowing Toxicity is standardized for the head and neck cancer population, however, demonstrates promising clinical applications across populations to standardize the clinical rating of pharyngeal swallow safety and severity.  Swallow Evaluation Recommendations Recommendations: PO diet PO Diet Recommendation: Dysphagia 1 (Pureed);Mildly thick liquids (Level 2, nectar thick) Liquid Administration via: No straw;Cup;Spoon Medication Administration: Crushed with puree Supervision: Full supervision/cueing for swallowing strategies;Staff to assist with self-feeding Swallowing strategies  : Slow rate;Small bites/sips;Minimize environmental distractions Postural changes: Stay upright 30-60 min after meals;Position pt fully upright for meals Oral care recommendations: Oral care BID (2x/day)    Norleen IVAR Blase, MA, CCC-SLP Speech Therapy  03/30/2024,4:09 PM

## 2024-03-30 NOTE — Plan of Care (Signed)
" °  Problem: Metabolic: Goal: Ability to maintain appropriate glucose levels will improve Outcome: Progressing   Problem: Clinical Measurements: Goal: Respiratory complications will improve Outcome: Progressing Goal: Cardiovascular complication will be avoided Outcome: Progressing   Problem: Activity: Goal: Risk for activity intolerance will decrease Outcome: Not Progressing   "

## 2024-03-30 NOTE — Plan of Care (Signed)

## 2024-03-30 NOTE — Evaluation (Signed)
 Clinical/Bedside Swallow Evaluation Patient Details  Name: Christian Morgan MRN: [id number] Date of Birth: 07/27/1940  Today's Date: 03/30/2024 Time: SLP Start Time (ACUTE ONLY): 9052 SLP Stop Time (ACUTE ONLY): 0955 SLP Time Calculation (min) (ACUTE ONLY): 8 min  Past Medical History:  Past Medical History:  Diagnosis Date   Allergic rhinitis    Chronic headache    Complication of anesthesia    after eye surgery, had trouble catching breath   Daytime somnolence 01/05/2017   Essential hypertension 10/31/2009   Hard of hearing    Hyperglycemia due to diabetes mellitus 09/20/2017   Hyperlipidemia    Hypertension    Major neurocognitive disorder, unclear etiology 08/02/2019   OSA (obstructive sleep apnea) 2011   s/p septoplasty; was supposed to wear cpap but better after septoplasty   Type I diabetes mellitus 10/31/2009   Past Surgical History:  Past Surgical History:  Procedure Laterality Date   BACK SURGERY     COLONOSCOPY     EYE SURGERY Bilateral    cataract surgery with lens implants   NASAL SEPTOPLASTY W/ TURBINOPLASTY Bilateral 01/11/2019   Procedure: NASAL SEPTOPLASTY;  Surgeon: Karis Clunes, MD;  Location: [facility] OR;  Service: ENT;  Laterality: Bilateral;   S/p Crani for CHI     subdural hematoma    SHOULDER SURGERY  2009   Right   TURBINATE REDUCTION Bilateral 01/11/2019   Procedure: Turbinate Reduction;  Surgeon: Karis Clunes, MD;  Location: [facility] OR;  Service: ENT;  Laterality: Bilateral;   HPI:  Christian Morgan is a 84 y.o. male who p/w AHRF iso aspiration pneumonitis.  The patient was brought in by a family member (son) due to concerns of fever and poor responsiveness. Recent suspected viral illness in family. Chest CT 1/28:  Bilateral lower lobe predominant endobronchial debris and  peribronchovascular ground-glass/consolidation, right greater than  left, suspicious for aspiration.  Seen by ST for clinical swallow management July 2024 with recs for regular diet with thin  liquids.  Pt with medical history significant of dementia, Parkinson's disease, dysphagia, DM2 (A1c 7.2 in 08/2022), HLD, GERD, and last [facility] admission in 08/2022 w/ COVID (tx w/ remdesivir  and dexamethasone ).    Assessment / Plan / Recommendation  Clinical Impression  Pt presents with an oral dysphagia and increased risk of silent aspiration.  Pt with intermittent oral holding of bolus trials (thin liquid and puree). Pt benefited from verbal cues to swallow.  Administered parkinson's medication whole with puree with nursing in room.  Pt holding tablets on tongue.  Required mulitple following boluses of puree and liquid was to clear.  Solid textures were not trialed.  Pt with watery eyes during evaluation, hx PD, and chest imaging concerning for aspiration.  Recommend instrumental evaluation prior to initiation of PO diet. Pt may have single sips of water pending further assessment, and priority oral meds (i.e. parkinson's medication) crushed with puree.   SLP will plan for MBSS as radioligy and SLP schedules permit.    Recommend pt remain NPO at present.  Priority oral medications can be administered crushed in puree. Pt may have ice chips and small, single sips of water in moderation, after good oral care, with fully awake/alert, with upright positioning and direct supervision.   SLP Visit Diagnosis: Dysphagia, oral phase (R13.11)    Aspiration Risk  Moderate aspiration risk    Diet Recommendation NPO    Medication Administration: Crushed with puree (priority oral meds)    Other Recommendations Oral Care Recommendations: Oral  care QID;Oral care prior to ice chip/H20      Frequency and Duration  (TBD)          Prognosis Prognosis for improved oropharyngeal function:  (TBD)      Swallow Study   General Date of Onset: 03/29/24 HPI: Christian Morgan is a 84 y.o. male who p/w AHRF iso aspiration pneumonitis.  The patient was brought in by a family member (son) due to concerns of fever and  poor responsiveness. Recent suspected viral illness in family. Chest CT 1/28:  Bilateral lower lobe predominant endobronchial debris and  peribronchovascular ground-glass/consolidation, right greater than  left, suspicious for aspiration.  Seen by ST for clinical swallow management July 2024 with recs for regular diet with thin liquids.  Pt with medical history significant of dementia, Parkinson's disease, dysphagia, DM2 (A1c 7.2 in 08/2022), HLD, GERD, and last [facility] admission in 08/2022 w/ COVID (tx w/ remdesivir  and dexamethasone ). Type of Study: Bedside Swallow Evaluation Previous Swallow Assessment: BSE July 2024 Diet Prior to this Study: NPO Temperature Spikes Noted: No History of Recent Intubation: No Behavior/Cognition: Alert;Cooperative;Requires cueing Oral Cavity Assessment: Within Functional Limits Oral Care Completed by SLP: No Oral Cavity - Dentition: Adequate natural dentition Self-Feeding Abilities: Needs assist Patient Positioning: Upright in bed Baseline Vocal Quality: Normal Volitional Cough: Weak    Oral/Motor/Sensory Function Overall Oral Motor/Sensory Function: Mild impairment Facial ROM: Reduced right;Reduced left Facial Symmetry: Within Functional Limits Lingual ROM: Within Functional Limits Lingual Symmetry: Within Functional Limits Lingual Strength: Reduced Velum: Within Functional Limits Mandible: Within Functional Limits   Ice Chips Ice chips: Not tested   Thin Liquid Thin Liquid: Impaired Presentation: Cup Oral Phase Functional Implications: Oral holding    Nectar Thick Nectar Thick Liquid: Not tested   Honey Thick Honey Thick Liquid: Not tested   Puree Puree: Impaired Presentation: Spoon Oral Phase Functional Implications: Oral residue   Solid     Solid: Not tested      Anette FORBES Grippe, MA, CCC-SLP Acute Rehabilitation Services Office: [phone] 03/30/2024,10:16 AM

## 2024-03-30 NOTE — TOC CM/SW Note (Signed)
 Transition of Care [facility]) - Inpatient Brief Assessment   Patient Details  Name: Christian Morgan MRN: [id number] Date of Birth: 1940/07/11  [facility] CM/SW Contact:    Sudie Erminio Deems, RN Phone Number: 03/30/2024, 2:39 PM   Clinical Narrative: Patient presented with AMS. ICM did attempt to call the son to discuss disposition needs. ICM will continue to follow for needs as the patient progresses.   Transition of Care Asessment: Insurance and Status: Insurance coverage has been reviewed Patient has primary care physician: Yes Readmission risk has been reviewed: Yes Transition of care needs: transition of care needs identified, TOC will continue to follow

## 2024-03-30 NOTE — Progress Notes (Signed)
 PT Cancellation Note  Patient Details Name: Christian Morgan MRN: [id number] DOB: 1940/07/13   Cancelled Treatment:    Reason Eval/Treat Not Completed: Patient at procedure or test/unavailable. Will follow up for PT Evaluation as schedule permits.  Marnette Perkins, PT, DPT Acute Rehabilitation Services  Personal: Secure Chat Rehab Office: [phone]  Darice LITTIE Almas 03/30/2024, 2:18 PM

## 2024-03-30 NOTE — Progress Notes (Addendum)
 " PROGRESS NOTE  Christian Morgan  FMW:[id number] DOB: 07-Dec-1940 DOA: 03/29/2024 PCP: Rexanne Ingle, MD   Brief Narrative: Patient is a 84 year old male with history of dementia, Parkinson disease, dysphagia, diabetes type 2 with recent A1c of 7.2, hyperlipidemia, GERD, who presented for further evaluation of fever, increased confusion, poor oral intake.  On presentation, he was febrile up to 102.9, tachycardic, tachypneic and hypoxic requiring 3 L of oxygen.  Labs with creatinine 1.3.  CT head did not show any acute findings.  CT chest shows  aspiration pneumonia.  Started on antibiotics, IV fluid.  Speech therapy consulted.  Currently recommending n.p.o.  Assessment & Plan:  Principal Problem:   Acute hypoxemic respiratory failure (HCC)   Acute hypoxic respiratory failure secondary to aspiration pneumonitis: Presented with shortness of breath, fever, increased confusion.  CT imaging showed bilateral lower lobe predominant endobronchial debris and peribronchovascular ground-glass/consolidation, right greater than left, suspicious for aspiration.  Started on antibiotics with ceftriaxone , doxycycline .  Currently afebrile.  Has mild leukocytosis.  Dysphagia: Speech therapy following.  Recommend n.p.o. for now.  Continue gentle IV fluids with D5  AKI: Presented with creatinine in the range of 1.3.  Baseline creatinine was normal.  AKI resolved  Constipation: CT imaging showed moderate to severe stool burden with probable fecal impaction.  Continue aggressive bowel regimen.  Abdomen is soft and nondistended today.  History of dementia/Parkinson's disease: On Sinemet .  Remains confused.  Continue delirium precaution, frequent reorientation  History of hypertension: On losartan  at home.  Currently on hold.  History of hyperlipidemia: On Crestor  at home  Diabetes type 2: Takes metformin at home.  Currently on sliding scale.  Monitor blood sugars  Debility/deconditioning: Will consult  PT/OT        DVT prophylaxis:enoxaparin  (LOVENOX ) injection 40 mg Start: 03/29/24 2200     Code Status: Full Code  Family Communication:Called son Maurie Olesen for update  on phone today  Patient status:Obs  Patient is from :Home  Anticipated discharge un:Ynfz vs SnF  Estimated DC date:2-3 days   Consultants: None  Procedures:None  Antimicrobials:  Anti-infectives (From admission, onward)    Start     Dose/Rate Route Frequency Ordered Stop   03/29/24 2100  cefTRIAXone  (ROCEPHIN ) 1 g in sodium chloride  0.9 % 100 mL IVPB        1 g 200 mL/hr over 30 Minutes Intravenous Every 24 hours 03/29/24 1251 04/03/24 2059   03/29/24 2100  doxycycline  (VIBRAMYCIN ) 100 mg in sodium chloride  0.9 % 250 mL IVPB        100 mg 125 mL/hr over 120 Minutes Intravenous Every 12 hours 03/29/24 1251 04/03/24 2059   03/29/24 0915  vancomycin  (VANCOREADY) IVPB 2000 mg/400 mL        2,000 mg 200 mL/hr over 120 Minutes Intravenous  Once 03/29/24 0907 03/29/24 1205   03/29/24 0915  ceFEPIme  (MAXIPIME ) 2 g in sodium chloride  0.9 % 100 mL IVPB        2 g 200 mL/hr over 30 Minutes Intravenous  Once 03/29/24 0908 03/29/24 0957       Subjective: Patient seen and examined at bedside today.  Lying in bed.  Remains confused.  Overall comfortable.  Had mittens on hand.  On 2 L of oxygen per minute.  Does not look in any kind of respiratory  distress.  Not coughing.  Currently n.p.o.  Objective: Vitals:   03/29/24 2357 03/30/24 0012 03/30/24 0433 03/30/24 0727  BP: (!) 93/47 (!) 104/51 (!) 112/55 ROLLEN)  112/53  Pulse:  67  76  Resp: 20  18 16   Temp: 98.2 F (36.8 C)   99.9 F (37.7 C)  TempSrc: Axillary  Axillary Oral  SpO2:  100%  99%  Weight:        Intake/Output Summary (Last 24 hours) at 03/30/2024 0812 Last data filed at 03/30/2024 9487 Gross per 24 hour  Intake 3599.4 ml  Output 650 ml  Net 2949.4 ml   Filed Weights   03/29/24 0848 03/29/24 2106  Weight: 83.9 kg 85.4 kg     Examination:  General exam: Overall comfortable, not in distress, confused, lying on bed HEENT: PERRL Respiratory system: Few crackles on bilateral bases Cardiovascular system: S1 & S2 heard, RRR.  Gastrointestinal system: Abdomen is nondistended, soft and nontender. Central nervous system: Alert and awake and oriented, obeys commands Extremities: No edema, no clubbing ,no cyanosis Skin: No rashes, no ulcers,no icterus   GU: Foley   Data Reviewed: I have personally reviewed following labs and imaging studies  CBC: Recent Labs  Lab 03/29/24 0905 03/29/24 0912 03/29/24 0913 03/29/24 1230 03/30/24 0419  WBC 9.5  --   --  11.2* 12.0*  NEUTROABS 7.7  --   --   --   --   HGB 11.7* 11.6* 12.2* 10.2* 10.1*  HCT 35.1* 34.0* 36.0* 30.5* 30.2*  MCV 90.9  --   --  90.5 90.1  PLT 327  --   --  289 272   Basic Metabolic Panel: Recent Labs  Lab 03/29/24 0905 03/29/24 0912 03/29/24 0913 03/29/24 1230 03/30/24 0419  NA 134* 135 135  --  138  K 3.9 3.8 3.8  --  3.6  CL 97*  --  96*  --  105  CO2 25  --   --   --  23  GLUCOSE 129*  --  131*  --  89  BUN 17  --  18  --  17  CREATININE 1.24  --  1.30* 0.99 0.99  CALCIUM  9.4  --   --   --  8.7*     Recent Results (from the past 240 hours)  Resp panel by RT-PCR (RSV, Flu A&B, Covid) Anterior Nasal Swab     Status: None   Collection Time: 03/22/24  5:31 PM   Specimen: Anterior Nasal Swab  Result Value Ref Range Status   SARS Coronavirus 2 by RT PCR NEGATIVE NEGATIVE Final   Influenza A by PCR NEGATIVE NEGATIVE Final   Influenza B by PCR NEGATIVE NEGATIVE Final    Comment: (NOTE) The Xpert Xpress SARS-CoV-2/FLU/RSV plus assay is intended as an aid in the diagnosis of influenza from Nasopharyngeal swab specimens and should not be used as a sole basis for treatment. Nasal washings and aspirates are unacceptable for Xpert Xpress SARS-CoV-2/FLU/RSV testing.  Fact Sheet for  Patients: bloggercourse.com  Fact Sheet for Healthcare Providers: seriousbroker.it  This test is not yet approved or cleared by the [place]  FDA and has been authorized for detection and/or diagnosis of SARS-CoV-2 by FDA under an Emergency Use Authorization (EUA). This EUA will remain in effect (meaning this test can be used) for the duration of the COVID-19 declaration under Section 564(b)(1) of the Act, 21 U.S.C. section 360bbb-3(b)(1), unless the authorization is terminated or revoked.     Resp Syncytial Virus by PCR NEGATIVE NEGATIVE Final    Comment: (NOTE) Fact Sheet for Patients: bloggercourse.com  Fact Sheet for Healthcare Providers: seriousbroker.it  This test is not yet approved  or cleared by the [place]  FDA and has been authorized for detection and/or diagnosis of SARS-CoV-2 by FDA under an Emergency Use Authorization (EUA). This EUA will remain in effect (meaning this test can be used) for the duration of the COVID-19 declaration under Section 564(b)(1) of the Act, 21 U.S.C. section 360bbb-3(b)(1), unless the authorization is terminated or revoked.  Performed at [facility], [address], [place], [place] [address]   Culture, blood (routine x 2)     Status: None (Preliminary result)   Collection Time: 03/29/24  8:51 AM   Specimen: BLOOD  Result Value Ref Range Status   Specimen Description BLOOD SITE NOT SPECIFIED  Final   Special Requests   Final    BOTTLES DRAWN AEROBIC AND ANAEROBIC Blood Culture results may not be optimal due to an inadequate volume of blood received in culture bottles   Culture   Final    NO GROWTH < 24 HOURS Performed at [facility], [address] [place], [place] [address]    Report Status PENDING  Incomplete  Resp panel by RT-PCR (RSV, Flu A&B, Covid) Anterior Nasal Swab     Status: Abnormal   Collection  Time: 03/29/24  8:51 AM   Specimen: Anterior Nasal Swab  Result Value Ref Range Status   SARS Coronavirus 2 by RT PCR NEGATIVE NEGATIVE Final   Influenza A by PCR NEGATIVE NEGATIVE Final   Influenza B by PCR NEGATIVE NEGATIVE Final    Comment: (NOTE) The Xpert Xpress SARS-CoV-2/FLU/RSV plus assay is intended as an aid in the diagnosis of influenza from Nasopharyngeal swab specimens and should not be used as a sole basis for treatment. Nasal washings and aspirates are unacceptable for Xpert Xpress SARS-CoV-2/FLU/RSV testing.  Fact Sheet for Patients: bloggercourse.com  Fact Sheet for Healthcare Providers: seriousbroker.it  This test is not yet approved or cleared by the [place]  FDA and has been authorized for detection and/or diagnosis of SARS-CoV-2 by FDA under an Emergency Use Authorization (EUA). This EUA will remain in effect (meaning this test can be used) for the duration of the COVID-19 declaration under Section 564(b)(1) of the Act, 21 U.S.C. section 360bbb-3(b)(1), unless the authorization is terminated or revoked.     Resp Syncytial Virus by PCR POSITIVE (A) NEGATIVE Final    Comment: (NOTE) Fact Sheet for Patients: bloggercourse.com  Fact Sheet for Healthcare Providers: seriousbroker.it  This test is not yet approved or cleared by the [place]  FDA and has been authorized for detection and/or diagnosis of SARS-CoV-2 by FDA under an Emergency Use Authorization (EUA). This EUA will remain in effect (meaning this test can be used) for the duration of the COVID-19 declaration under Section 564(b)(1) of the Act, 21 U.S.C. section 360bbb-3(b)(1), unless the authorization is terminated or revoked.  Performed at [facility], [address], [place], [place] [address]   Culture, blood (routine x 2)     Status: None (Preliminary result)   Collection Time:  03/29/24  4:43 PM   Specimen: BLOOD LEFT ARM  Result Value Ref Range Status   Specimen Description BLOOD LEFT ARM  Final   Special Requests   Final    BOTTLES DRAWN AEROBIC AND ANAEROBIC Blood Culture adequate volume   Culture   Final    NO GROWTH < 12 HOURS Performed at [facility], [address], [place], [place] [address]    Report Status PENDING  Incomplete     Radiology Studies: CT  CHEST ABDOMEN PELVIS WO CONTRAST Result Date: 03/29/2024 CLINICAL DATA:  Sepsis, altered mental status. EXAM: CT CHEST, ABDOMEN AND PELVIS WITHOUT CONTRAST TECHNIQUE: Multidetector CT imaging of the chest, abdomen and pelvis was performed following the standard protocol without IV contrast. RADIATION DOSE REDUCTION: This exam was performed according to the departmental dose-optimization program which includes automated exposure control, adjustment of the mA and/or kV according to patient size and/or use of iterative reconstruction technique. COMPARISON:  CT abdomen pelvis 10/07/2022. FINDINGS: CT CHEST FINDINGS Cardiovascular: Atherosclerotic calcification of the aorta, aortic valve and coronary arteries. Heart is at the upper limits of normal in size. No pericardial effusion. Mediastinum/Nodes: Thoracic inlet lymph nodes are not enlarged by CT size criteria. No pathologically enlarged mediastinal or axillary lymph nodes. Hilar regions are difficult to definitively evaluate without IV contrast. Air in the esophagus can be seen with dysmotility. Lungs/Pleura: Image quality is degraded by respiratory motion. Peribronchovascular ground-glass and consolidation in the lower lobes, right greater than left, with endobronchial debris in the right lower lobe. Additional scattered areas of ground-glass in the left upper lobe. No pleural fluid. Airway is unremarkable. Musculoskeletal: Degenerative changes in the spine. CT ABDOMEN PELVIS FINDINGS Hepatobiliary: Liver is grossly unremarkable. Small gallstones. No biliary  ductal dilatation. Pancreas: Negative. Spleen: Negative. Adrenals/Urinary Tract: Adrenal glands are unremarkable. Right renal stones. Low-attenuation lesion in the right kidney. No specific follow-up necessary. Kidneys are otherwise unremarkable. Ureters are decompressed. Foley catheter is seen in a decompressed bladder. Stomach/Bowel: Small hiatal hernia. Stomach and small bowel are otherwise unremarkable. 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 Appendix is not readily visualized. Moderate to severe stool burden. Large amount of stool in the rectum. Vascular/Lymphatic: Atherosclerotic calcification of the aorta. No pathologically enlarged lymph nodes. Reproductive: Prostate is enlarged. Other: Small bilateral inguinal hernias contain fat.  No free fluid. Musculoskeletal: Degenerative changes in the spine. IMPRESSION: 1. Bilateral lower lobe predominant endobronchial debris and peribronchovascular ground-glass/consolidation, right greater than left, suspicious for aspiration. 2. Moderate to severe stool burden with probable fecal impaction. 3. Ileocecal valve lipoma. 4. Cholelithiasis. 5. Right renal stones. 6. Enlarged prostate. 7. Aortic atherosclerosis (ICD10-I70.0). Coronary artery calcification. Electronically Signed   By: Newell Eke M.D.   On: 03/29/2024 10:23   CT Head Wo Contrast Result Date: 03/29/2024 EXAM: CT HEAD WITHOUT CONTRAST 03/29/2024 09:56:03 AM TECHNIQUE: CT of the head was performed without the administration of intravenous contrast. Automated exposure control, iterative reconstruction, and/or weight based adjustment of the mA/kV was utilized to reduce the radiation dose to as low as reasonably achievable. COMPARISON: Head CT 02/10/2024. CLINICAL HISTORY: Mental status change, unknown cause. FINDINGS: BRAIN AND VENTRICLES: There is no evidence of an acute infarct, intracranial hemorrhage, mass, midline shift, hydrocephalus, or extra-axial fluid collection. There is mild to moderate cerebral  atrophy. Patchy hypodensities in the cerebral white matter bilaterally are unchanged and nonspecific but compatible with mild to moderate chronic small vessel ischemic disease. A chronic lacunar infarct in the left basal ganglia is unchanged. Calcified atherosclerosis at the skull base. ORBITS: Bilateral cataract extraction. SINUSES: Moderate mucosal thickening in the paranasal sinuses, greatest in the ethmoid air cells bilaterally. Clear mastoid air cells. SOFT TISSUES AND SKULL: Left parietal craniotomy. No acute soft tissue abnormality. No skull fracture. IMPRESSION: 1. No acute intracranial abnormality. 2. Mild to moderate cerebral atrophy and chronic small vessel ischemic disease. Electronically signed by: Dasie Hamburg MD 03/29/2024 10:07 AM EST RP Workstation: [id number]   DG Chest Port 1 View Result Date: 03/29/2024 CLINICAL DATA:  Fever, altered mental status EXAM: PORTABLE CHEST 1 VIEW COMPARISON:  None Available. FINDINGS: The heart size and mediastinal contours are within normal limits. Both lungs are clear. The visualized skeletal structures are unremarkable. IMPRESSION: No active disease. Electronically Signed   By: Wilkie Lent M.D.   On: 03/29/2024 09:48    Scheduled Meds:  carbidopa -levodopa   2 tablet Oral TID   Chlorhexidine  Gluconate Cloth  6 each Topical Daily   enoxaparin  (LOVENOX ) injection  40 mg Subcutaneous Q24H   insulin  aspart  0-9 Units Subcutaneous Q4H   Continuous Infusions:  sodium chloride  100 mL/hr at 03/30/24 0512   acetaminophen  Stopped (03/29/24 1346)   cefTRIAXone  (ROCEPHIN )  IV Stopped (03/29/24 2304)   doxycycline  (VIBRAMYCIN ) IV Stopped (03/30/24 0036)     LOS: 0 days   Ivonne Mustache, MD [facility] P1/29/2026, 8:12 AM  "

## 2024-03-30 NOTE — Telephone Encounter (Signed)
 Christian Morgan(son) dropped off another form. He stated that he called [employer] to follow up on paper work. 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 So he printed off more to provide. Son also stated that he is currently in the hospital with Pneumonia.   PH: [phone]

## 2024-03-31 ENCOUNTER — Other Ambulatory Visit: Payer: Self-pay | Admitting: Neurology

## 2024-03-31 DIAGNOSIS — J9601 Acute respiratory failure with hypoxia: Secondary | ICD-10-CM | POA: Diagnosis not present

## 2024-03-31 DIAGNOSIS — G20A1 Parkinson's disease without dyskinesia, without mention of fluctuations: Secondary | ICD-10-CM

## 2024-03-31 DIAGNOSIS — F0390 Unspecified dementia without behavioral disturbance: Secondary | ICD-10-CM

## 2024-03-31 DIAGNOSIS — R4182 Altered mental status, unspecified: Secondary | ICD-10-CM

## 2024-03-31 LAB — BASIC METABOLIC PANEL WITH GFR
Anion gap: 12 (ref 5–15)
BUN: 19 mg/dL (ref 8–23)
CO2: 23 mmol/L (ref 22–32)
Calcium: 8.5 mg/dL — ABNORMAL LOW (ref 8.9–10.3)
Chloride: 108 mmol/L (ref 98–111)
Creatinine, Ser: 0.89 mg/dL (ref 0.61–1.24)
GFR, Estimated: >60 mL/min
Glucose, Bld: 112 mg/dL — ABNORMAL HIGH (ref 70–99)
Potassium: 3.7 mmol/L (ref 3.5–5.1)
Sodium: 143 mmol/L (ref 135–145)

## 2024-03-31 LAB — CBC
HCT: 28.9 % — ABNORMAL LOW (ref 39.0–52.0)
Hemoglobin: 9.8 g/dL — ABNORMAL LOW (ref 13.0–17.0)
MCH: 30.5 pg (ref 26.0–34.0)
MCHC: 33.9 g/dL (ref 30.0–36.0)
MCV: 90 fL (ref 80.0–100.0)
Platelets: 268 10*3/uL (ref 150–400)
RBC: 3.21 MIL/uL — ABNORMAL LOW (ref 4.22–5.81)
RDW: 13.5 % (ref 11.5–15.5)
WBC: 8.2 10*3/uL (ref 4.0–10.5)
nRBC: 0 % (ref 0.0–0.2)

## 2024-03-31 LAB — GLUCOSE, CAPILLARY
Glucose-Capillary: 103 mg/dL — ABNORMAL HIGH (ref 70–99)
Glucose-Capillary: 107 mg/dL — ABNORMAL HIGH (ref 70–99)
Glucose-Capillary: 112 mg/dL — ABNORMAL HIGH (ref 70–99)
Glucose-Capillary: 164 mg/dL — ABNORMAL HIGH (ref 70–99)
Glucose-Capillary: 167 mg/dL — ABNORMAL HIGH (ref 70–99)
Glucose-Capillary: 171 mg/dL — ABNORMAL HIGH (ref 70–99)

## 2024-03-31 MED ORDER — TAMSULOSIN HCL 0.4 MG PO CAPS
0.4000 mg | ORAL_CAPSULE | Freq: Every day | ORAL | Status: DC
  Administered 2024-03-31 – 2024-04-03 (×4): 0.4 mg via ORAL
  Filled 2024-03-31 (×4): qty 1
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
## 2024-03-31 NOTE — Plan of Care (Signed)
  Problem: Metabolic: Goal: Ability to maintain appropriate glucose levels will improve Outcome: Progressing   Problem: Clinical Measurements: Goal: Respiratory complications will improve Outcome: Progressing Goal: Cardiovascular complication will be avoided Outcome: Progressing

## 2024-03-31 NOTE — Evaluation (Signed)
 Physical Therapy Evaluation Patient Details Name: Christian Morgan MRN: [id number] DOB: February 18, 1941 Today's Date: 03/31/2024  History of Present Illness  84 y.o. male admitted 03/29/24 with fever, AMS, poor oral intake. Workup for acute hypoxic respiratory failure secondary to aspiration pneumonitis, AKI, constipation. PMH includes dementia, Parkinson's disease, HOH, DM2, dyspahgia.   Clinical Impression  Pt admitted from home with son providing 24/7 assist per chart review. Unclear PLOF as pt was A&Ox1 with no family available. Pt was soiled upon arrival and required TotalA for posterior pericare with multiple rolls performed. Able to move to the EOB with MaxAx2 and CGA for seated balance. Required MaxAx2 to stand and step towards the [facility] with [facility]. Anticipate pt would do better returning home to a familiar setting. As long as pt continues to have 24/7 physical assist, would recommend return home with HHPT and DME listed below. If family is unable to provide current level of assist, would then recommend <3hrs post acute rehab. Will continue to follow acutely.      If plan is discharge home, recommend the following: A lot of help with walking and/or transfers;A lot of help with bathing/dressing/bathroom;Assistance with cooking/housework;Assist for transportation;Help with stairs or ramp for entrance   Can travel by private vehicle    No    Equipment Recommendations Wheelchair (measurements PT);Wheelchair cushion (measurements PT);Hospital bed;Hoyer lift     Functional Status Assessment Patient has had a recent decline in their functional status and demonstrates the ability to make significant improvements in function in a reasonable and predictable amount of time.     Precautions / Restrictions Precautions Precautions: Fall Recall of Precautions/Restrictions: Impaired Precaution/Restrictions Comments: B mitts, foley Restrictions Weight Bearing Restrictions Per Provider Order: No       Mobility  Bed Mobility Overal bed mobility: Needs Assistance Bed Mobility: Rolling, Sidelying to Sit, Sit to Supine Rolling: Mod assist, Used rails, Max assist, +2 for physical assistance, +2 for safety/equipment Sidelying to sit: Max assist, +2 for physical assistance, +2 for safety/equipment, HOB elevated   Sit to supine: Mod assist, Used rails   General bed mobility comments: MinA to roll to the left with MaxAx2 to roll to the right. Completed sidelying to sit with MaxAx2 for trunk and LE management. Able to return to supine with ModA to control trunk    Transfers Overall transfer level: Needs assistance Equipment used: 2 person hand held assist Transfers: Sit to/from Stand Sit to Stand: Max assist, +2 physical assistance, +2 safety/equipment    General transfer comment: MaxAx2 via [facility] with pt then able to take lateral side steps towards [facility]       Balance Overall balance assessment: Needs assistance, Mild deficits observed, not formally tested Sitting-balance support: No upper extremity supported, Feet supported Sitting balance-Leahy Scale: Fair     Standing balance support: Bilateral upper extremity supported, During functional activity, Reliant on assistive device for balance Standing balance-Leahy Scale: Zero       Pertinent Vitals/Pain Pain Assessment Pain Assessment: Faces Faces Pain Scale: Hurts little more Pain Location: generalized, unable to localize pain Pain Descriptors / Indicators: Aching, Discomfort Pain Intervention(s): Limited activity within patient's tolerance, Monitored during session, Repositioned    Home Living Family/patient expects to be discharged to:: Private residence Living Arrangements: Spouse/significant other;Children Available Help at Discharge: Family;Available 24 hours/day (son cares for parents) Type of Home: House Home Access: Level entry    Home Layout: One level Home Equipment: Agricultural Consultant (2 wheels);Rollator (4  wheels);Shower seat;Cane - single point  Prior Function Prior Level of Function : Needs assist;Patient poor historian/Family not available      Mobility Comments: Per chart review on 6/24- pt was ambulating with SPC and walking stick. Unclear current mobility level ADLs Comments: Per chart review on 6/24, pt was ind with bathing/dressing with assist for cooking/cleaning. Unclear current assist level     Extremity/Trunk Assessment   Upper Extremity Assessment Upper Extremity Assessment: Defer to OT evaluation    Lower Extremity Assessment Lower Extremity Assessment: Generalized weakness    Cervical / Trunk Assessment Cervical / Trunk Assessment: Kyphotic  Communication   Communication Communication: Other (comment) Factors Affecting Communication:  (limited verbalizations)    Cognition Arousal: Alert Behavior During Therapy: Restless   PT - Cognitive impairments: History of cognitive impairments, Orientation, Awareness, Memory, Initiation, Attention, Sequencing, Problem solving, Safety/Judgement   Orientation impairments: Place, Time, Situation    PT - Cognition Comments: hx of dementia Following commands: Impaired Following commands impaired: Follows one step commands inconsistently     Cueing Cueing Techniques: Verbal cues, Tactile cues, Visual cues     General Comments General comments (skin integrity, edema, etc.): VSS on RA     PT Assessment Patient needs continued PT services  PT Problem List Decreased strength;Decreased activity tolerance;Decreased balance;Decreased mobility;Decreased cognition;Decreased knowledge of use of DME;Decreased safety awareness;Decreased knowledge of precautions       PT Treatment Interventions DME instruction;Gait training;Functional mobility training;Therapeutic activities;Therapeutic exercise;Balance training;Neuromuscular re-education;Patient/family education;Wheelchair mobility training    PT Goals (Current goals can be  found in the Care Plan section)  Acute Rehab PT Goals Patient Stated Goal: pt unable to state goal PT Goal Formulation: Patient unable to participate in goal setting Time For Goal Achievement: 04/14/24 Potential to Achieve Goals: Fair    Frequency Min 2X/week     Co-evaluation   Reason for Co-Treatment: For patient/therapist safety;To address functional/ADL transfers;Necessary to address cognition/behavior during functional activity PT goals addressed during session: Mobility/safety with mobility;Balance;Proper use of DME         AM-PAC PT 6 Clicks Mobility  Outcome Measure Help needed turning from your back to your side while in a flat bed without using bedrails?: A Lot Help needed moving from lying on your back to sitting on the side of a flat bed without using bedrails?: Total Help needed moving to and from a bed to a chair (including a wheelchair)?: Total Help needed standing up from a chair using your arms (e.g., wheelchair or bedside chair)?: Total Help needed to walk in hospital room?: Total Help needed climbing 3-5 steps with a railing? : Total 6 Click Score: 7    End of Session Equipment Utilized During Treatment: Gait belt Activity Tolerance: Patient tolerated treatment well Patient left: in bed;with call bell/phone within reach;with bed alarm set;Other (comment) (mitts donned) Nurse Communication: Mobility status PT Visit Diagnosis: Unsteadiness on feet (R26.81);Other abnormalities of gait and mobility (R26.89);Muscle weakness (generalized) (M62.81)    Time: 9174-9150 PT Time Calculation (min) (ACUTE ONLY): 24 min   Charges:   PT Evaluation $PT Eval Moderate Complexity: 1 Mod   PT General Charges $$ ACUTE PT VISIT: 1 Visit       Kate ORN, PT, DPT Secure Chat Preferred  Rehab Office [phone]   Kate BRAVO Wendolyn 03/31/2024, 10:46 AM

## 2024-03-31 NOTE — Consult Note (Signed)
 "                                                  Palliative Care Consult Note                                  Date: 03/31/2024   Patient Name: Christian Morgan  DOB: 1940-11-19  MRN: [id number]  Age / Sex: 84 y.o., male  PCP: Rexanne Ingle, MD Referring Physician: Jillian Buttery, MD  Reason for Consultation: Establishing goals of care  HPI/Patient Profile: 84 y.o. male  with past medical history of dementia, dysphagia, DM2, HLD, HTN, and major neurocognitive disorder (being treated as Parkinson's) admitted on 03/29/2024 with fever, increased confusion, and poor oral intake.   Patient febrile to 102.9, tachycardic, tachypneic, and hypoxic requiring 3L oxygen. CT chest showed aspiration pneumonia. Patient started on antibiotics and IV fluids. SLP consulted and completed MBS showing moderate primarily oral phase dysphagia. Patient on puree diet and nectar thick liquids.  Past Medical History:  Diagnosis Date   Allergic rhinitis    Chronic headache    Complication of anesthesia    after eye surgery, had trouble catching breath   Daytime somnolence 01/05/2017   Essential hypertension 10/31/2009   Hard of hearing    Hyperglycemia due to diabetes mellitus 09/20/2017   Hyperlipidemia    Hypertension    Major neurocognitive disorder, unclear etiology 08/02/2019   OSA (obstructive sleep apnea) 2011   s/p septoplasty; was supposed to wear cpap but better after septoplasty   Type I diabetes mellitus 10/31/2009    Subjective:   I have reviewed medical records including EPIC notes, labs and imaging, received updates from nursing, and assessed the patient who is unable to have a meaningful conversation. Then met with the patient's son Diego and daughter Thurston (by phone) to discuss diagnosis prognosis, GOC, EOL wishes, and disposition options.  I introduced Palliative Medicine as specialized medical care for people living with serious illness. It focuses on providing relief from symptoms and  stress of a serious illness. The goal is to improve quality of life for both the patient and the family.  Today's Discussion: In the morning when I saw the patient he was only oriented to person. He was in no apparent distress but told me he was feeling terrible and having pain. He was in no apparent distress. RN notified for prn pain medication. No family was at bedside.  In the afternoon I met with the patient's son at bedside and daughter by phone. Patient was awake and agitated attempting to pull at lines. We discussed the patient's chronic conditions including dementia at length.  Education provided on dementia as a chronic, progressive, and irreversible disease that is often accelerated and exacerbated by acute illnesses and hospitalizations. We discussed patient's dysphagia which seems to be worse this hospitalization. We discussed the patient's risk for aspiration and rehospitalization.  Prior to this admission the patient was living in his home with his wife (who son and daughter share have memory concerns and poor health) and son. The patient has been married over 60 years. He is retired from public relations account executive, is a [employer], and boxed when he was younger. The patient's son is his primary care giver. They have many durable equipment  items in the home to assist the patient. He usually walks with a walker. He has been eating about half of his meals. Prior to this admission his son was cutting his foods very small.   We discussed advanced directives. The son believes they have a HCPOA document naming him as proxy but he and his sister make decisions together. Requested he look for this document. We discussed code status and scopes of care. Recommended consideration of DNR status, understanding evidenced-based poor outcomes in similar hospitalized patients, as the cause of the arrest is likely associated with chronic/terminal disease rather than a reversible acute cardio-pulmonary event. After  some discussion patient's children changed the patient to DNR/DNI. The patient had told his children he would not want to be on a breathing machine. They want to follow his wishes. I shared my worry that the patient's dysphagia will continue to worsen as his dementia progresses. I encouraged the patient's children to consider whether the patient would or would not want artificial nutrition in the future. Discussed that dementia patient's often do not have improved quality or quantity of life with artificial nutrition. They will consider. Current plan is to continue treating the treatable and discharging home with outpatient palliative follow up.  Emotional support and therapeutic listening provided. Discussed the importance of continued conversation with family and the medical providers regarding overall plan of care and treatment options, ensuring decisions are within the context of the patient's values and GOCs.  Questions and concerns were addressed. Hard Choices booklet left for review. The family was encouraged to call with questions or concerns. PMT will continue to support holistically.  Review of Systems  Constitutional:        Generalized pain    Objective:   Primary Diagnoses: Present on Admission:  Acute hypoxemic respiratory failure (HCC)   Physical Exam Vitals reviewed.  Constitutional:      General: He is not in acute distress. HENT:     Head: Normocephalic and atraumatic.  Cardiovascular:     Rate and Rhythm: Normal rate.  Pulmonary:     Effort: Pulmonary effort is normal.  Neurological:     Mental Status: He is disoriented.  Psychiatric:        Behavior: Behavior is agitated.     Vital Signs:  BP (!) 133/58 (BP Location: Right Arm)   Pulse 74   Temp 98.9 F (37.2 C) (Axillary)   Resp 19   Ht 5' 8 (1.727 m)   Wt 85.4 kg   SpO2 93%   BMI 28.63 kg/m     Advanced Care Planning:   Existing Vynca/ACP Documentation: None  Primary Decision Maker: NEXT  OF KIN  Code Status/Advance Care Planning: Changed to DNR/DNI  Assessment & Plan:   SUMMARY OF RECOMMENDATIONS   Changed to DNR/DNI Continue treating the treatable Encouraged children to continue discussing GOC Discharge home with outpatient palliative follow up- TOC order placed PMT will continue to follow   Discussed with: bedside RN and Dr. Jillian  Time Total: 105 minutes    Thank you for allowing us  to participate in the care of [facility] PMT will continue to support holistically.   Signed by: Stephane Palin, NP Palliative Medicine Team  Team Phone # [phone] (Nights/Weekends)  03/31/2024, 3:04 PM   "

## 2024-03-31 NOTE — Care Plan (Signed)
" ° ° ° °  Referral received for [facility]: goals of care discussion. Chart reviewed and updates received from RN. Patient assessed. He is oriented to self and is unable to engage appropriately in discussions. Attempted to contact patient's son Arav Bannister. Unable to reach. Unable to leave voicemail because mailbox is full.   PMT will re-attempt to contact family at a later time/date. Detailed note and recommendations to follow once GOC has been completed.   Thank you for your referral and allowing PMT to assist in [facility] Merle's care.   Stephane Palin, NP Palliative Medicine Team  Team Phone # [phone]   NO CHARGE  "

## 2024-03-31 NOTE — Evaluation (Signed)
 Occupational Therapy Evaluation Patient Details Name: Christian Morgan MRN: [id number] DOB: 1940/04/05 Today's Date: 03/31/2024   History of Present Illness   84 y.o. male admitted 03/29/24 with fever, AMS, poor oral intake. Workup for acute hypoxic respiratory failure secondary to aspiration pneumonitis, AKI, constipation. PMH includes dementia, Parkinson's disease, HOH, DM2, dyspahgia.     Clinical Impressions Prior to this admission, patient living with his son, who is the full time caregiver. All information was obtained from the chart and previous admissions as no family was present. Currently, patient tremulous with all movement, and with decreased awareness (though likely baseline). Patient soiled, and requiring total A for peri care and min to max A of 2 (max A to roll to the L) to roll. Patient with mod A for bed mobility, but unable to stand without 2 person HHA at max A level. If patient has support, OT recommending HHOT at discharge. OT will contin      If plan is discharge home, recommend the following:   Two people to help with walking and/or transfers;A lot of help with bathing/dressing/bathroom;Assistance with feeding;Direct supervision/assist for financial management;Direct supervision/assist for medications management;Assist for transportation;Help with stairs or ramp for entrance;Supervision due to cognitive status     Functional Status Assessment   Patient has had a recent decline in their functional status and demonstrates the ability to make significant improvements in function in a reasonable and predictable amount of time.     Equipment Recommendations   None recommended by OT     Recommendations for Other Services         Precautions/Restrictions   Precautions Precautions: Fall Recall of Precautions/Restrictions: Impaired Precaution/Restrictions Comments: B mitts, foley Restrictions Weight Bearing Restrictions Per Provider Order: No      Mobility Bed Mobility Overal bed mobility: Needs Assistance Bed Mobility: Rolling, Sidelying to Sit, Sit to Supine Rolling: Mod assist, Used rails, Max assist, +2 for physical assistance, +2 for safety/equipment Sidelying to sit: Max assist, +2 for physical assistance, +2 for safety/equipment, HOB elevated   Sit to supine: Mod assist, Used rails   General bed mobility comments: MinA to roll to the left with MaxAx2 to roll to the right. Completed sidelying to sit with MaxAx2 for trunk and LE management. Able to return to supine with ModA to control trunk    Transfers Overall transfer level: Needs assistance Equipment used: 2 person hand held assist Transfers: Sit to/from Stand Sit to Stand: Max assist, +2 physical assistance, +2 safety/equipment           General transfer comment: MaxAx2 via [facility] with pt then able to take lateral side steps towards [facility]      Balance Overall balance assessment: Needs assistance, Mild deficits observed, not formally tested Sitting-balance support: No upper extremity supported, Feet supported Sitting balance-Leahy Scale: Fair     Standing balance support: Bilateral upper extremity supported, During functional activity, Reliant on assistive device for balance Standing balance-Leahy Scale: Zero                             ADL either performed or assessed with clinical judgement   ADL Overall ADL's : Needs assistance/impaired Eating/Feeding: Set up;Sitting   Grooming: Set up;Wash/dry hands;Wash/dry face;Sitting   Upper Body Bathing: Contact guard assist;Sitting   Lower Body Bathing: Total assistance;Bed level   Upper Body Dressing : Contact guard assist;Sitting   Lower Body Dressing: Total assistance;Bed level   Toilet Transfer: Maximal  assistance;+2 for physical assistance;+2 for safety/equipment;BSC/3in1 Toilet Transfer Details (indicate cue type and reason): Max A of 2 stand, able to take steps to [facility] Toileting- Clothing  Manipulation and Hygiene: Total assistance;+2 for physical assistance;+2 for safety/equipment;Bed level Toileting - Clothing Manipulation Details (indicate cue type and reason): large BM and unaware     Functional mobility during ADLs: Moderate assistance;+2 for physical assistance;+2 for safety/equipment;Cueing for safety;Cueing for sequencing General ADL Comments: Prior to this admission, patient living with his son, who is the full time caregiver. All information was obtained from the chart and previous admissions as no family was present. Currently, patient tremulous with all movement, and with decreased awareness (though likely baseline). Patient soiled, and requiring total A for peri care and min to max A of 2 (max A to roll to the L) to roll. Patient with mod A for bed mobility, but unable to stand without 2 person HHA at max A level. If patient has support, OT recommending HHOT at discharge. OT will continue to follow acutely.     Vision   Vision Assessment?: No apparent visual deficits     Perception Perception: Not tested       Praxis Praxis: Not tested       Pertinent Vitals/Pain Pain Assessment Pain Assessment: Faces Faces Pain Scale: Hurts little more Pain Location: generalized, unable to localize pain Pain Descriptors / Indicators: Aching, Discomfort Pain Intervention(s): Limited activity within patient's tolerance, Repositioned, Monitored during session     Extremity/Trunk Assessment Upper Extremity Assessment Upper Extremity Assessment: Generalized weakness   Lower Extremity Assessment Lower Extremity Assessment: Defer to PT evaluation   Cervical / Trunk Assessment Cervical / Trunk Assessment: Kyphotic   Communication Communication Communication: Other (comment) Factors Affecting Communication:  (limited verbalizations)   Cognition Arousal: Alert Behavior During Therapy: Restless Cognition: History of cognitive impairments             OT - Cognition  Comments: no family present for evaluation, dementia at baseline, unsure if there is acute change                 Following commands: Impaired Following commands impaired: Follows one step commands inconsistently     Cueing  General Comments   Cueing Techniques: Verbal cues;Tactile cues;Visual cues  VSS on RA   Exercises     Shoulder Instructions      Home Living Family/patient expects to be discharged to:: Private residence Living Arrangements: Spouse/significant other;Children Available Help at Discharge: Family;Available 24 hours/day (son cares for parents) Type of Home: House Home Access: Level entry     Home Layout: One level               Home Equipment: [employer] (2 wheels);Rollator (4 wheels);Shower seat;Cane - single point   Additional Comments: information from previous admission, no family present      Prior Functioning/Environment Prior Level of Function : Needs assist;Patient poor historian/Family not available             Mobility Comments: Per chart review on 6/24- pt was ambulating with SPC and walking stick. Unclear current mobility level ADLs Comments: Per chart review on 6/24, pt was ind with bathing/dressing with assist for cooking/cleaning. Unclear current assist level    OT Problem List: Decreased range of motion;Decreased strength;Decreased activity tolerance;Decreased coordination;Decreased cognition;Decreased safety awareness;Decreased knowledge of use of DME or AE;Decreased knowledge of precautions   OT Treatment/Interventions: Self-care/ADL training;Therapeutic exercise;DME and/or AE instruction;Manual therapy;Therapeutic activities;Patient/family education;Balance training  OT Goals(Current goals can be found in the care plan section)   Acute Rehab OT Goals Patient Stated Goal: unable OT Goal Formulation: Patient unable to participate in goal setting Time For Goal Achievement: 04/14/24 Potential to Achieve  Goals: Fair   OT Frequency:  Min 2X/week    Co-evaluation   Reason for Co-Treatment: For patient/therapist safety;To address functional/ADL transfers;Necessary to address cognition/behavior during functional activity PT goals addressed during session: Mobility/safety with mobility;Balance;Proper use of DME OT goals addressed during session: ADL's and self-care;Strengthening/ROM      AM-PAC OT 6 Clicks Daily Activity     Outcome Measure Help from another person eating meals?: A Little Help from another person taking care of personal grooming?: A Little Help from another person toileting, which includes using toliet, bedpan, or urinal?: Total Help from another person bathing (including washing, rinsing, drying)?: A Lot Help from another person to put on and taking off regular upper body clothing?: A Little Help from another person to put on and taking off regular lower body clothing?: Total 6 Click Score: 13   End of Session Nurse Communication: Mobility status  Activity Tolerance: Patient limited by fatigue Patient left: in bed;with call bell/phone within reach;with bed alarm set;with restraints reapplied  OT Visit Diagnosis: Other abnormalities of gait and mobility (R26.89);Muscle weakness (generalized) (M62.81)                Time: 9175-9150 OT Time Calculation (min): 25 min Charges:  OT General Charges $OT Visit: 1 Visit OT Evaluation $OT Eval Moderate Complexity: 1 Mod  Ronal Gift E. Shantavia Jha, OTR/L Acute Rehabilitation Services [phone]   Ronal Gift Salt 03/31/2024, 3:07 PM

## 2024-03-31 NOTE — Progress Notes (Signed)
 Speech Language Pathology Treatment: Dysphagia  Patient Details Name: Christian Morgan MRN: [id number] DOB: 05/26/1940 Today's Date: 03/31/2024 Time: 8842-8789 SLP Time Calculation (min) (ACUTE ONLY): 13 min  Assessment / Plan / Recommendation Clinical Impression  Recommend continue current diet of Dysphagia 1/nectar-thickened liquids with FULL precautions and A with self-feeding when pt alert/responsive.  ST will continue to f/u for dysphagia tx during acute stay.  Pt seen for dysphagia f/u tx session post MBS completion with pt lethargic and requiring mod verbal/tactile cues for po consumption.  Pt was asleep and required mod cues to rouse for po intake with upright positioning established prior to intake.  Lunch tray in room when SLP arrived, so pt may have recently completed prior to SLP arrival.  Nursing stated he was able to take medications crushed in puree without incident.  Pt agreeable to trials, but quickly fatigued/fell asleep during intake with SLP limiting consumption d/t decreased alertness level.  Puree/nectar-thickened liquids provided via spoon without overt s/s of aspiration noted.  Discussed with nursing staff, providing pt with single ice chips/tsp thin between meals after aggressive oral care completion for pt comfort/satiety if requested. ST will continue to f/u in acute setting.     HPI HPI: Christian Morgan is a 84 y.o. male who p/w AHRF iso aspiration pneumonitis. The patient was brought in by a family member (son) due to concerns of fever and poor responsiveness. Recent suspected viral illness in family. Chest CT 1/28:  Bilateral lower lobe predominant endobronchial debris and peribronchovascular ground-glass/consolidation, right greater than left, suspicious for aspiration. Seen by ST for clinical swallow management July 2024 with recs for regular diet with thin liquids. Pt with medical history significant of dementia, Parkinson's disease, dysphagia, DM2 (A1c 7.2 in  08/2022), HLD, GERD, and last [facility] admission in 08/2022 w/ COVID (tx w/ remdesivir  and dexamethasone ).MBS completed and pt placed on Dysphagia 1/nectar-thickened liquids d/t sensed and silent aspiration with thin liquids.  ST f/u for diet tolerance/education.      SLP Plan  Continue with current plan of care        Swallow Evaluation Recommendations   Recommendations: PO diet PO Diet Recommendation: Dysphagia 1 (Pureed);Mildly thick liquids (Level 2, nectar thick) Liquid Administration via: No straw;Cup;Spoon Medication Administration: Crushed with puree Supervision: Full supervision/cueing for swallowing strategies;Staff to assist with self-feeding Postural changes: Stay upright 30-60 min after meals Oral care recommendations: Oral care BID (2x/day)     Recommendations   PO diet (Dysphagia 1(puree)/nectar-thickened liquids via spoon/cup;no straw                  Oral care BID   Frequent or constant Supervision/Assistance Dysphagia, oropharyngeal phase (R13.12)     Continue with current plan of care     Christian Morgan,M.S.,CCC-SLP  03/31/2024, 12:21 PM

## 2024-04-01 DIAGNOSIS — J9601 Acute respiratory failure with hypoxia: Secondary | ICD-10-CM | POA: Diagnosis not present

## 2024-04-01 LAB — GLUCOSE, CAPILLARY
Glucose-Capillary: 106 mg/dL — ABNORMAL HIGH (ref 70–99)
Glucose-Capillary: 110 mg/dL — ABNORMAL HIGH (ref 70–99)
Glucose-Capillary: 129 mg/dL — ABNORMAL HIGH (ref 70–99)
Glucose-Capillary: 152 mg/dL — ABNORMAL HIGH (ref 70–99)
Glucose-Capillary: 156 mg/dL — ABNORMAL HIGH (ref 70–99)
Glucose-Capillary: 231 mg/dL — ABNORMAL HIGH (ref 70–99)

## 2024-04-01 MED ORDER — AMOXICILLIN-POT CLAVULANATE 875-125 MG PO TABS
1.0000 | ORAL_TABLET | Freq: Two times a day (BID) | ORAL | Status: DC
  Administered 2024-04-01 – 2024-04-02 (×3): 1 via ORAL
  Filled 2024-04-01 (×5): qty 1

## 2024-04-01 NOTE — Plan of Care (Signed)

## 2024-04-01 NOTE — Progress Notes (Signed)
 [facility] PROGRESS NOTE    Progress Note  Christian Morgan  FMW:[id number] DOB: Mar 19, 1940 DOA: 03/29/2024 PCP: Rexanne Ingle, MD     Brief Narrative:   Christian Morgan is an 84 y.o. male past medical history of dementia, Parkinson's disease dysphagia diabetes mellitus type 2 with a last A1c of 7.2 comes in for confusion and decreased oral intake was found to be febrile and tachycardic CT of the head showed no acute findings CT of the chest showed probable aspiration pneumonia started on IV fluids and antibiotics has completed course in house.  Patient is medically stable for discharge per son able to take him home because he is taking care of his sick mother.  Assessment/Plan:   Acute hypoxemic respiratory failure (HCC) likely due to aspiration pneumonia: CT of the chest was suspicious for aspiration pneumonia leukocytosis is resolved he has remained afebrile has been weaned to room air. Transition to oral Augmentin .  Dysphagia: Continue dysphagia 1 diet per speech recs.  Acute kidney injury: Likely hemodynamic mediated resolved IV fluids.  Constipation: Continue bowel regimen.  History of dementia/Parkinson's disease: Continue current medication no changes made.  Essential hypertension: Losartan  currently on hold as blood pressure was borderline.  Diabetes mellitus type 2: Continue metformin .  Urinary retention: Foley was placed in the ED he was continue on Flomax , he was given a voiding trial.  Goals of care: Palliative care met with family.  Debility/deconditioning/failure to thrive: PT OT consulted recommended home with home health. Son cannot take him home as he is taking care of his sick mother, he also relates he does not have the pured food his father needs.  He feels uncomfortable taking him home.  Until the storm is over.   DVT prophylaxis: lovenox  Family Communication:none Status is: Inpatient Remains inpatient appropriate because:  Aspiration pneumonia    Code Status:     Code Status Orders  (From admission, onward)           Start     Ordered   03/31/24 1500  Do not attempt resuscitation (DNR)- Limited -Do Not Intubate (DNI)  (Code Status)  Continuous       Question Answer Comment  If pulseless and not breathing No CPR or chest compressions.   In Pre-Arrest Conditions (Patient Is Breathing and Has A Pulse) Do not intubate. Provide all appropriate non-invasive medical interventions. Avoid ICU transfer unless indicated or required.   Consent: Discussion documented in EHR or advanced directives reviewed      03/31/24 1459           Code Status History     Date Active Date Inactive Code Status Order ID Comments User Context   03/29/2024 1207 03/31/2024 1459 Full Code [id number]  Georgina Basket, MD ED   08/27/2022 0508 08/31/2022 2124 Full Code [id number]  Shona Terry SAILOR, DO ED         IV Access:   Peripheral IV   Procedures and diagnostic studies:   DG Swallowing Func-Speech Pathology Result Date: 03/30/2024 Table formatting from the original result was not included. Modified Barium Swallow Study Patient Details Name: Christian Morgan MRN: [id number] Date of Birth: [birth year] Today's Date: 03/30/2024 HPI/PMH: HPI: Christian Morgan is a 84 y.o. male who p/w AHRF iso aspiration pneumonitis.  The patient was brought in by a family member (son) due to concerns of fever and poor responsiveness. Recent suspected viral illness in family. Chest CT 1/28:  Bilateral lower lobe predominant endobronchial  debris and  peribronchovascular ground-glass/consolidation, right greater than  left, suspicious for aspiration.  Seen by ST for clinical swallow management July 2024 with recs for regular diet with thin liquids.  Pt with medical history significant of dementia, Parkinson's disease, dysphagia, DM2 (A1c 7.2 in 08/2022), HLD, GERD, and last [facility] admission in 08/2022 w/ COVID (tx w/ remdesivir  and dexamethasone ).  Clinical Impression: Rec: dys 1 (puree), nectar thick liquids. SLP will follow. Patient presents with a moderate primarily oral phase dysphagia as per this MBS with one instance of silent aspiration (PAS 8) and one instance of sensed aspiration (PAS 7) with thin liquids and flash penetration (PAS 2) with nectar thick liquids. He exhibited impaired oral control and manipulation of boluses with repetive lingual movements, leading to swallow initiation delays to level of pyriform sinus with all liquids. Anterior hyoid excursion, laryngeal elevation and laryngeal vestibule closure were all complete but mistiming of laryngeal vestibule closure resulted in intermittent aspiration of thin liquids. Trace to mild amount of pyriform and tongue base residuals remained s/p initial swallow, no retrograde movement of barium through or below PES were observed. Factors that may increase risk of adverse event in presence of aspiration Christian Morgan 2021): Frail or deconditioned;Poor general health and/or compromised immunity;Respiratory or GI disease  DIGEST Swallow Severity Rating*             Safety: 2             Efficiency:0             Overall Pharyngeal Swallow Severity: 2 1: mild; 2: moderate; 3: severe; 4: profound *The Dynamic Imaging Grade of Swallowing Toxicity is standardized for the head and neck cancer population, however, demonstrates promising clinical applications across populations to standardize the clinical rating of pharyngeal swallow safety and severity. Recommendations/Plan: Swallowing Evaluation Recommendations Swallowing Evaluation Recommendations Recommendations: PO diet PO Diet Recommendation: Dysphagia 1 (Pureed); Mildly thick liquids (Level 2, nectar thick) Liquid Administration via: No straw; Cup; Spoon Medication Administration: Crushed with puree Supervision: Full supervision/cueing for swallowing strategies; Staff to assist with self-feeding Swallowing strategies  : Slow rate; Small bites/sips;  Minimize environmental distractions Postural changes: Stay upright 30-60 min after meals; Position pt fully upright for meals Oral care recommendations: Oral care BID (2x/day) Treatment Plan Treatment Plan Treatment recommendations: Therapy as outlined in treatment plan below Follow-up recommendations: Other (comment) (SLP at next venue of care) Functional status assessment: Patient has had a recent decline in their functional status and demonstrates the ability to make significant improvements in function in a reasonable and predictable amount of time. Treatment frequency: Min 2x/week Treatment duration: 1 week Interventions: Aspiration precaution training; Diet toleration management by SLP; Trials of upgraded texture/liquids; Patient/family education; Compensatory techniques Recommendations Recommendations for follow up therapy are one component of a multi-disciplinary discharge planning process, led by the attending physician.  Recommendations may be updated based on patient status, additional functional criteria and insurance authorization. Assessment: Orofacial Exam: Orofacial Exam Oral Cavity: Oral Hygiene: WFL Oral Cavity - Dentition: Adequate natural dentition Orofacial Anatomy: WFL Oral Motor/Sensory Function: WFL Anatomy: Anatomy: Prominent cricopharyngeus Boluses Administered: Boluses Administered Boluses Administered: Thin liquids (Level 0); Mildly thick liquids (Level 2, nectar thick); Moderately thick liquids (Level 3, honey thick); Puree; Solid  Oral Impairment Domain: Oral Impairment Domain Lip Closure: No labial escape Tongue control during bolus hold: Posterior escape of greater than half of bolus Bolus preparation/mastication: Minimal chewing/mashing with majority of bolus unchewed Bolus transport/lingual motion: Repetitive/disorganized tongue motion; Slow tongue motion  Oral residue: Residue collection on oral structures Location of oral residue : Tongue; Palate Initiation of pharyngeal swallow :  Pyriform sinuses  Pharyngeal Impairment Domain: Pharyngeal Impairment Domain Soft palate elevation: No bolus between soft palate (SP)/pharyngeal wall (PW) Laryngeal elevation: Complete superior movement of thyroid  cartilage with complete approximation of arytenoids to epiglottic petiole Anterior hyoid excursion: Complete anterior movement Epiglottic movement: Complete inversion Laryngeal vestibule closure: Complete, no air/contrast in laryngeal vestibule Pharyngeal stripping wave : Present - complete Pharyngeal contraction (A/P view only): N/A Pharyngoesophageal segment opening: Complete distension and complete duration, no obstruction of flow Tongue base retraction: No contrast between tongue base and posterior pharyngeal wall (PPW) Pharyngeal residue: Trace residue within or on pharyngeal structures Location of pharyngeal residue: Pyriform sinuses; Tongue base  Esophageal Impairment Domain: Esophageal Impairment Domain Esophageal clearance upright position: Complete clearance, esophageal coating Pill: No data recorded Penetration/Aspiration Scale Score: Penetration/Aspiration Scale Score 1.  Material does not enter airway: Moderately thick liquids (Level 3, honey thick); Solid; Puree 2.  Material enters airway, remains ABOVE vocal cords then ejected out: Mildly thick liquids (Level 2, nectar thick) 7.  Material enters airway, passes BELOW cords and not ejected out despite cough attempt by patient: Thin liquids (Level 0) 8.  Material enters airway, passes BELOW cords without attempt by patient to eject out (silent aspiration) : Thin liquids (Level 0) Compensatory Strategies: Compensatory Strategies Compensatory strategies: Yes Straw: Ineffective Ineffective Straw: Thin liquid (Level 0); Mildly thick liquid (Level 2, nectar thick)   General Information: Caregiver present: No  Diet Prior to this Study: NPO   Temperature : Febrile   No data recorded  Supplemental O2: None (Room air)   History of Recent Intubation: No   Behavior/Cognition: Alert; Cooperative; Requires cueing Self-Feeding Abilities: Needs assist with self-feeding Baseline vocal quality/speech: Hypophonia/low volume Volitional Cough: Unable to elicit Volitional Swallow: Unable to elicit Exam Limitations: No limitations Goal Planning: Prognosis for improved oropharyngeal function: Fair No data recorded No data recorded Patient/Family Stated Goal: unable to state, no family present Consulted and agree with results and recommendations: Pt unable/family or caregiver not available Pain: Pain Assessment Pain Assessment: Faces Faces Pain Scale: 0 Facial Expression: 0 Body Movements: 0 Muscle Tension: 0 Compliance with ventilator (intubated pts.): N/A Vocalization (extubated pts.): 0 CPOT Total: 0 End of Session: Start Time:SLP Start Time (ACUTE ONLY): 1402 Stop Time: SLP Stop Time (ACUTE ONLY): 1418 Time Calculation:SLP Time Calculation (min) (ACUTE ONLY): 16 min Charges: SLP Evaluations $ SLP Speech Visit: 1 Visit SLP Evaluations $BSS Swallow: 1 Procedure $MBS Swallow: 1 Procedure SLP visit diagnosis: SLP Visit Diagnosis: Dysphagia, oral phase (R13.11) Past Medical History: Past Medical History: Diagnosis Date  Allergic rhinitis   Chronic headache   Complication of anesthesia   after eye surgery, had trouble catching breath  Daytime somnolence 01/05/2017  Essential hypertension 10/31/2009  Hard of hearing   Hyperglycemia due to diabetes mellitus 09/20/2017  Hyperlipidemia   Hypertension   Major neurocognitive disorder, unclear etiology 08/02/2019  OSA (obstructive sleep apnea) 2011  s/p septoplasty; was supposed to wear cpap but better after septoplasty  Type I diabetes mellitus 10/31/2009 Past Surgical History: Past Surgical History: Procedure Laterality Date  BACK SURGERY    COLONOSCOPY    EYE SURGERY Bilateral   cataract surgery with lens implants  NASAL SEPTOPLASTY W/ TURBINOPLASTY Bilateral 01/11/2019  Procedure: NASAL SEPTOPLASTY;  Surgeon: Karis Clunes, MD;  Location: [facility]  OR;  Service: ENT;  Laterality: Bilateral;  S/p Crani for CHI    subdural hematoma  SHOULDER SURGERY  2009  Right  TURBINATE REDUCTION Bilateral 01/11/2019  Procedure: Turbinate Reduction;  Surgeon: Karis Clunes, MD;  Location: [facility] OR;  Service: ENT;  Laterality: Bilateral; Norleen IVAR Blase, MA, CCC-SLP Speech Therapy 03/30/2024, 4:11 PM    Medical Consultants:   None.   Subjective:    Hager Compston no complaints this morning.  Objective:    Vitals:   03/31/24 1151 03/31/24 2000 03/31/24 2328 04/01/24 0400  BP: (!) 133/58 (!) 128/55 (!) 131/59 123/65  Pulse:   78 66  Resp: 19 18 18 16   Temp: 98.9 F (37.2 C) 98.7 F (37.1 C) 98.2 F (36.8 C) 98.3 F (36.8 C)  TempSrc: Axillary Oral Oral Oral  SpO2: 93% 98% 98% 99%  Weight:      Height:       SpO2: 99 % O2 Flow Rate (L/min): 2 L/min   Intake/Output Summary (Last 24 hours) at 04/01/2024 0649 Last data filed at 03/31/2024 2329 Gross per 24 hour  Intake 100 ml  Output 25 ml  Net 75 ml   Filed Weights   03/29/24 0848 03/29/24 2106  Weight: 83.9 kg 85.4 kg    Exam: General exam: In no acute distress. Respiratory system: Good air movement and clear to auscultation. Cardiovascular system: S1 & S2 heard, RRR. No JVD.  Gastrointestinal system: Abdomen is nondistended, soft and nontender.  Extremities: No pedal edema. Skin: No rashes, lesions or ulcers Psychiatry: Judgement and insight appear normal. Mood & affect appropriate.    Data Reviewed:    Labs: Basic Metabolic Panel: Recent Labs  Lab 03/29/24 0905 03/29/24 0912 03/29/24 0913 03/29/24 1230 03/30/24 0419 03/31/24 0420  NA 134* 135 135  --  138 143  K 3.9 3.8 3.8  --  3.6 3.7  CL 97*  --  96*  --  105 108  CO2 25  --   --   --  23 23  GLUCOSE 129*  --  131*  --  89 112*  BUN 17  --  18  --  17 19  CREATININE 1.24  --  1.30* 0.99 0.99 0.89  CALCIUM  9.4  --   --   --  8.7* 8.5*   GFR Estimated Creatinine Clearance: 64.5 mL/min (by C-G formula  based on SCr of 0.89 mg/dL). Liver Function Tests: Recent Labs  Lab 03/29/24 0905  AST 41  ALT 21  ALKPHOS 72  BILITOT 0.6  PROT 7.0  ALBUMIN 3.5   Recent Labs  Lab 03/29/24 0905  LIPASE 16   No results for input(s): AMMONIA in the last 168 hours. Coagulation profile Recent Labs  Lab 03/29/24 0905  INR 1.2   COVID-19 Labs  No results for input(s): DDIMER, FERRITIN, LDH, CRP in the last 72 hours.  Lab Results  Component Value Date   SARSCOV2NAA NEGATIVE 03/29/2024   SARSCOV2NAA NEGATIVE 03/22/2024   SARSCOV2NAA POSITIVE (A) 08/27/2022   SARSCOV2NAA NOT DETECTED 01/07/2019    CBC: Recent Labs  Lab 03/29/24 0905 03/29/24 0912 03/29/24 0913 03/29/24 1230 03/30/24 0419 03/31/24 0420  WBC 9.5  --   --  11.2* 12.0* 8.2  NEUTROABS 7.7  --   --   --   --   --   HGB 11.7* 11.6* 12.2* 10.2* 10.1* 9.8*  HCT 35.1* 34.0* 36.0* 30.5* 30.2* 28.9*  MCV 90.9  --   --  90.5 90.1 90.0  PLT 327  --   --  289 272 268   Cardiac Enzymes:  No results for input(s): CKTOTAL, CKMB, CKMBINDEX, TROPONINI in the last 168 hours. BNP (last 3 results) Recent Labs    03/22/24 1731  PROBNP 112.0   CBG: Recent Labs  Lab 03/31/24 1149 03/31/24 1556 03/31/24 2043 04/01/24 0018 04/01/24 0416  GLUCAP 171* 167* 164* 156* 110*   D-Dimer: No results for input(s): DDIMER in the last 72 hours. Hgb A1c: Recent Labs    03/29/24 1230  HGBA1C 6.8*   Lipid Profile: No results for input(s): CHOL, HDL, LDLCALC, TRIG, CHOLHDL, LDLDIRECT in the last 72 hours. Thyroid  function studies: No results for input(s): TSH, T4TOTAL, T3FREE, THYROIDAB in the last 72 hours.  Invalid input(s): FREET3 Anemia work up: No results for input(s): VITAMINB12, FOLATE, FERRITIN, TIBC, IRON, RETICCTPCT in the last 72 hours. Sepsis Labs: Recent Labs  Lab 03/29/24 0905 03/29/24 0912 03/29/24 1230 03/30/24 0419 03/31/24 0420  WBC 9.5  --  11.2* 12.0*  8.2  LATICACIDVEN  --  1.6  --   --   --    Microbiology Recent Results (from the past 240 hours)  Resp panel by RT-PCR (RSV, Flu A&B, Covid) Anterior Nasal Swab     Status: None   Collection Time: 03/22/24  5:31 PM   Specimen: Anterior Nasal Swab  Result Value Ref Range Status   SARS Coronavirus 2 by RT PCR NEGATIVE NEGATIVE Final   Influenza A by PCR NEGATIVE NEGATIVE Final   Influenza B by PCR NEGATIVE NEGATIVE Final    Comment: (NOTE) The Xpert Xpress SARS-CoV-2/FLU/RSV plus assay is intended as an aid in the diagnosis of influenza from Nasopharyngeal swab specimens and should not be used as a sole basis for treatment. Nasal washings and aspirates are unacceptable for Xpert Xpress SARS-CoV-2/FLU/RSV testing.  Fact Sheet for Patients: bloggercourse.com  Fact Sheet for Healthcare Providers: seriousbroker.it  This test is not yet approved or cleared by the [place]  FDA and has been authorized for detection and/or diagnosis of SARS-CoV-2 by FDA under an Emergency Use Authorization (EUA). This EUA will remain in effect (meaning this test can be used) for the duration of the COVID-19 declaration under Section 564(b)(1) of the Act, 21 U.S.C. section 360bbb-3(b)(1), unless the authorization is terminated or revoked.     Resp Syncytial Virus by PCR NEGATIVE NEGATIVE Final    Comment: (NOTE) Fact Sheet for Patients: bloggercourse.com  Fact Sheet for Healthcare Providers: seriousbroker.it  This test is not yet approved or cleared by the [place]  FDA and has been authorized for detection and/or diagnosis of SARS-CoV-2 by FDA under an Emergency Use Authorization (EUA). This EUA will remain in effect (meaning this test can be used) for the duration of the COVID-19 declaration under Section 564(b)(1) of the Act, 21 U.S.C. section 360bbb-3(b)(1), unless the authorization is  terminated or revoked.  Performed at [facility], [address] [place], [place] [address]   Culture, blood (routine x 2)     Status: None (Preliminary result)   Collection Time: 03/29/24  8:51 AM   Specimen: BLOOD  Result Value Ref Range Status   Specimen Description BLOOD SITE NOT SPECIFIED  Final   Special Requests   Final    BOTTLES DRAWN AEROBIC AND ANAEROBIC Blood Culture results may not be optimal due to an inadequate volume of blood received in culture bottles   Culture   Final    NO GROWTH 2 DAYS Performed at [facility], [address], [place], [place] [address]    Report Status PENDING  Incomplete  Resp panel by RT-PCR (RSV, Flu A&B, Covid) Anterior Nasal Swab     Status: Abnormal   Collection Time: 03/29/24  8:51 AM   Specimen: Anterior Nasal Swab  Result Value Ref Range Status   SARS Coronavirus 2 by RT PCR NEGATIVE NEGATIVE Final   Influenza A by PCR NEGATIVE NEGATIVE Final   Influenza B by PCR NEGATIVE NEGATIVE Final    Comment: (NOTE) The Xpert Xpress SARS-CoV-2/FLU/RSV plus assay is intended as an aid in the diagnosis of influenza from Nasopharyngeal swab specimens and should not be used as a sole basis for treatment. Nasal washings and aspirates are unacceptable for Xpert Xpress SARS-CoV-2/FLU/RSV testing.  Fact Sheet for Patients: bloggercourse.com  Fact Sheet for Healthcare Providers: seriousbroker.it  This test is not yet approved or cleared by the [place]  FDA and has been authorized for detection and/or diagnosis of SARS-CoV-2 by FDA under an Emergency Use Authorization (EUA). This EUA will remain in effect (meaning this test can be used) for the duration of the COVID-19 declaration under Section 564(b)(1) of the Act, 21 U.S.C. section 360bbb-3(b)(1), unless the authorization is terminated or revoked.     Resp Syncytial Virus by PCR POSITIVE (A) NEGATIVE Final    Comment:  (NOTE) Fact Sheet for Patients: bloggercourse.com  Fact Sheet for Healthcare Providers: seriousbroker.it  This test is not yet approved or cleared by the [place]  FDA and has been authorized for detection and/or diagnosis of SARS-CoV-2 by FDA under an Emergency Use Authorization (EUA). This EUA will remain in effect (meaning this test can be used) for the duration of the COVID-19 declaration under Section 564(b)(1) of the Act, 21 U.S.C. section 360bbb-3(b)(1), unless the authorization is terminated or revoked.  Performed at [facility], [address], [place], [place] [address]   Culture, blood (routine x 2)     Status: None (Preliminary result)   Collection Time: 03/29/24  4:43 PM   Specimen: BLOOD LEFT ARM  Result Value Ref Range Status   Specimen Description BLOOD LEFT ARM  Final   Special Requests   Final    BOTTLES DRAWN AEROBIC AND ANAEROBIC Blood Culture adequate volume   Culture   Final    NO GROWTH 2 DAYS Performed at [facility], [address], [place], [place] [address]    Report Status PENDING  Incomplete     Medications:    carbidopa -levodopa   2 tablet Oral TID   Chlorhexidine  Gluconate Cloth  6 each Topical Daily   enoxaparin  (LOVENOX ) injection  40 mg Subcutaneous Q24H   insulin  aspart  0-9 Units Subcutaneous Q4H   polyethylene glycol  17 g Oral Daily   senna-docusate  1 tablet Oral BID   tamsulosin   0.4 mg Oral Daily   Continuous Infusions:  cefTRIAXone  (ROCEPHIN )  IV 2 g (03/31/24 0958)      LOS: 2 days   Erle Odell Castor  [facility]  04/01/2024, 6:49 AM

## 2024-04-01 NOTE — Progress Notes (Signed)
 [facility] [phone] [facility] Liaison Note  Request for Outpatient Palliative Services at discharge.  Hospital Liaison Team to follow for discharge disposition.  Please call with any questions or concerns.  Thank you, Randine Nail, BSN, [facility] [phone]

## 2024-04-02 ENCOUNTER — Other Ambulatory Visit (HOSPITAL_COMMUNITY): Payer: Self-pay

## 2024-04-02 DIAGNOSIS — J9601 Acute respiratory failure with hypoxia: Secondary | ICD-10-CM | POA: Diagnosis not present

## 2024-04-02 LAB — GLUCOSE, CAPILLARY
Glucose-Capillary: 132 mg/dL — ABNORMAL HIGH (ref 70–99)
Glucose-Capillary: 132 mg/dL — ABNORMAL HIGH (ref 70–99)
Glucose-Capillary: 135 mg/dL — ABNORMAL HIGH (ref 70–99)
Glucose-Capillary: 136 mg/dL — ABNORMAL HIGH (ref 70–99)
Glucose-Capillary: 136 mg/dL — ABNORMAL HIGH (ref 70–99)
Glucose-Capillary: 147 mg/dL — ABNORMAL HIGH (ref 70–99)
Glucose-Capillary: 165 mg/dL — ABNORMAL HIGH (ref 70–99)
Glucose-Capillary: 194 mg/dL — ABNORMAL HIGH (ref 70–99)

## 2024-04-02 MED ORDER — AMOXICILLIN-POT CLAVULANATE 875-125 MG PO TABS
1.0000 | ORAL_TABLET | Freq: Two times a day (BID) | ORAL | 0 refills | Status: AC
  Filled 2024-04-02: qty 3, 2d supply, fill #0

## 2024-04-02 MED ORDER — TAMSULOSIN HCL 0.4 MG PO CAPS
0.4000 mg | ORAL_CAPSULE | Freq: Every day | ORAL | 1 refills | Status: DC
  Filled 2024-04-02: qty 30, 30d supply, fill #0

## 2024-04-02 MED ORDER — GUAIFENESIN-DM 100-10 MG/5ML PO SYRP: 5.0000 mL | ORAL_SOLUTION | ORAL | Status: DC | PRN

## 2024-04-02 NOTE — Plan of Care (Signed)
" °  Problem: Fluid Volume: Goal: Ability to maintain a balanced intake and output will improve Outcome: Progressing   Problem: Metabolic: Goal: Ability to maintain appropriate glucose levels will improve Outcome: Progressing   Problem: Nutritional: Goal: Maintenance of adequate nutrition will improve Outcome: Progressing   Problem: Education: Goal: Knowledge of General Education information will improve Description: Including pain rating scale, medication(s)/side effects and non-pharmacologic comfort measures Outcome: Progressing   Problem: Health Behavior/Discharge Planning: Goal: Ability to manage health-related needs will improve Outcome: Progressing   "

## 2024-04-02 NOTE — TOC Transition Note (Addendum)
 Transition of Care [facility]) - Discharge Note   Patient Details  Name: Christian Morgan MRN: [id number] Date of Birth: 12/08/1940  Transition of Care [facility]) CM/SW Contact:  Robynn Eileen Hoose, RN Phone Number: 04/02/2024, 11:02 AM   Clinical Narrative: Patient is being discharged. Spoke with son and daughter, confirm they still do not have food in the home.  They are unable to gain access to store due to hazardous road conditions.  Also, family member in home tested RSV positive on Friday.  Family reports they were not aware of recommended DME and does not feel it will fit in the home. They report they were not aware of his mobility needs do not have 24/7 assistance available.  Provider made aware.   1424: Family called back. They are preparing the home for patient to discharge safely. They have cleared the living room to have patient stay in. Provider made aware and orders noted.  Orders sent to [facility] through Epic.    Final next level of care: Home w Home Health Services Barriers to Discharge: No Barriers Identified   Patient Goals and CMS Choice Patient states their goals for this hospitalization and ongoing recovery are:: plan to return home with home health          Discharge Placement                       Discharge Plan and Services Additional resources added to the After Visit Summary for   In-house Referral: NA Discharge Planning Services: CM Consult Post Acute Care Choice: Resumption of Svcs/PTA Provider, Home Health                    HH Arranged: RN, Disease Management, PT, OT, Nurse's Aide, Speech Therapy HH Agency: [facility] Date [facility] Agency Contacted: 03/31/24 Time HH Agency Contacted: 1409 Representative spoke with at [facility] Agency: [facility]  Social Drivers of Health (SDOH) Interventions SDOH Screenings   Food Insecurity: Patient Unable To Answer (03/30/2024)  Housing: Low Risk (03/30/2024)  Transportation Needs: No Transportation  Needs (03/30/2024)  Utilities: Patient Unable To Answer (03/30/2024)  Social Connections: Unknown (03/30/2024)  Tobacco Use: Medium Risk (03/30/2024)     Readmission Risk Interventions     No data to display

## 2024-04-03 ENCOUNTER — Other Ambulatory Visit (HOSPITAL_COMMUNITY): Payer: Self-pay

## 2024-04-03 LAB — CULTURE, BLOOD (ROUTINE X 2)
Culture: NO GROWTH
Culture: NO GROWTH
Special Requests: ADEQUATE

## 2024-04-03 LAB — GLUCOSE, CAPILLARY
Glucose-Capillary: 123 mg/dL — ABNORMAL HIGH (ref 70–99)
Glucose-Capillary: 98 mg/dL (ref 70–99)
Glucose-Capillary: 121 mg/dL — ABNORMAL HIGH (ref 70–99)
Glucose-Capillary: 158 mg/dL — ABNORMAL HIGH (ref 70–99)
Glucose-Capillary: 178 mg/dL — ABNORMAL HIGH (ref 70–99)

## 2024-04-03 MED ORDER — AMOXICILLIN-POT CLAVULANATE 875-125 MG PO TABS
1.0000 | ORAL_TABLET | Freq: Two times a day (BID) | ORAL | Status: AC
  Administered 2024-04-03 – 2024-04-04 (×4): 1 via ORAL
  Filled 2024-04-03 (×3): qty 1

## 2024-04-03 NOTE — Care Management Important Message (Signed)
**Note Christian-Identified via Obfuscation**  Important Message  Patient Details  Name: Christian Morgan MRN: [id number] Date of Birth: 08-28-1940   Important Message Given:  Yes - Medicare IM     Vonzell Arrie Sharps 04/03/2024, 3:45 PM

## 2024-04-03 NOTE — TOC Progression Note (Addendum)
 Transition of Care [facility]) - Progression Note    Patient Details  Name: Christian Morgan MRN: [id number] Date of Birth: 07/17/1940  Transition of Care [facility]) CM/SW Contact  Graves-Bigelow, Erminio Deems, RN Phone Number: 04/03/2024, 9:53 AM  Clinical Narrative: ICM received notification that the patient will be discharged home today. ICM spoke with son and he is asking that DME be delivered before the patient arrives. [employer] is aware that the hospital bed, hoyer lift, and wheelchair need to be delivered to the home- (confirmed hopefully first thing in am for delivery). Patient will need ambulance transport home. ICM has discussed other possible transportation modes via lead supervisors if PTAR states the roads are too icy.  Patient has not ambulated with physical therapy and is a 2 person  max assist-ICM did ask Physical Therapy to reassess the patient for possible SNF. ICM will continue to follow for additional needs as the patient progresses.   1502 ICM and CSW spoke with patients son and daughter. Family has declined SNF and the plan is to return home with home health. DME has to be in the home before PTAR is arranged. [employer] will call ICM when DME is en route to the home. No further needs identified today.   Expected Discharge Plan: Home w Home Health Services vs SNF Barriers to Discharge: No Barriers Identified    Expected Discharge Plan and Services In-house Referral: NA Discharge Planning Services: CM Consult Post Acute Care Choice: Resumption of Svcs/PTA Provider, Home Health Living arrangements for the past 2 months: Single Family Home Expected Discharge Date: 04/02/24                         HH Arranged: RN, Disease Management, PT, OT, Nurse's Aide, Speech Therapy HH Agency: [facility] Date HH Agency Contacted: 03/31/24 Time HH Agency Contacted: 1409 Representative spoke with at [facility] Agency: [facility]   Social Drivers of Health (SDOH) Interventions SDOH  Screenings   Food Insecurity: Patient Unable To Answer (03/30/2024)  Housing: Low Risk (03/30/2024)  Transportation Needs: No Transportation Needs (03/30/2024)  Utilities: Patient Unable To Answer (03/30/2024)  Social Connections: Unknown (03/30/2024)  Tobacco Use: Medium Risk (03/30/2024)    Readmission Risk Interventions     No data to display

## 2024-04-03 NOTE — Progress Notes (Signed)
 [facility] Liaison Note   [facility] continues to follow for discharge disposition for outpatient palliative services at home.     Please call with any palliative or hospice related questions or concerns.  Eleanor Nail, LPN [facility] Liaison [phone]

## 2024-04-03 NOTE — Progress Notes (Signed)
 Speech Language Pathology Treatment: Dysphagia  Patient Details Name: Christian Morgan MRN: [id number] DOB: 06-Nov-1940 Today's Date: 04/03/2024 Time: 1235-1300 SLP Time Calculation (min) (ACUTE ONLY): 25 min  Assessment / Plan / Recommendation Clinical Impression  Recommend continue current diet of Dysphagia 1(puree)/nectar-thickened liquids with FULL A/supervision for swallowing strategies with ST continuing to f/u in acute setting for dysphagia tx.  Christian Morgan was seen for a dysphagia tx f/u session with son in attendance.  He provided pt with tsp of nectar-thickened liquids and puree consistency food.  Pt required min verbal cues to initiate swallow and repeat swallows and/or clear throat and re-swallow during po consumption.  Delayed cough observed once during session likely d/t pharyngeal residue (noted on MBS prior).  Educated pt/son in depth regarding dysphagia, MBS results and compensatory strategies beneficial in reducing/eliminating aspiration risk.  In addition, water protocol discussed d/t son voicing pt consumed large amounts of water prior to this hospitalization and he is concerned about dehydration given current liquid recommendation.   HH SLP f/u recommended to provide dysphagia tx within home and potentially progress diet.  Objective re-assessment may be beneficial prn.  Son in agreement with plan.  ST will f/u briefly during acute stay for continued dysphagia tx/education.    HPI HPI: Christian Morgan is a 84 y.o. male who p/w AHRF iso aspiration pneumonitis. The patient was brought in by a family member (son) due to concerns of fever and poor responsiveness. Recent suspected viral illness in family. Chest CT 1/28:  Bilateral lower lobe predominant endobronchial debris and peribronchovascular ground-glass/consolidation, right greater than left, suspicious for aspiration. Seen by ST for clinical swallow management July 2024 with recs for regular diet with thin liquids. Pt  with medical history significant of dementia, Parkinson's disease, dysphagia, DM2 (A1c 7.2 in 08/2022), HLD, GERD, and last [facility] admission in 08/2022 w/ COVID (tx w/ remdesivir  and dexamethasone ).MBS completed and pt placed on Dysphagia 1/nectar-thickened liquids d/t sensed and silent aspiration with thin liquids. ST f/u for diet tolerance/education.      SLP Plan  Continue with current plan of care        Swallow Evaluation Recommendations   Recommendations: PO diet PO Diet Recommendation: Dysphagia 1 (Pureed);Mildly thick liquids (Level 2, nectar thick) Liquid Administration via: Cup;Spoon;No straw Medication Administration: Crushed with puree Supervision: Full supervision/cueing for swallowing strategies Postural changes: Stay upright 30-60 min after meals Oral care recommendations: Oral care BID (2x/day);Staff/trained caregiver to provide oral care     Recommendations   PO diet (Dysphagia 1(puree)/nectar-thickened liquids       Medications crushed/puree           Oral care BID;Staff/trained caregiver to provide oral care   Frequent or constant Supervision/Assistance Dysphagia, oropharyngeal phase (R13.12)     Continue with current plan of care     Christian Morgan,M.S.,CCC-SLP  04/03/2024, 1:45 PM

## 2024-04-04 ENCOUNTER — Other Ambulatory Visit (HOSPITAL_COMMUNITY): Payer: Self-pay

## 2024-04-04 DIAGNOSIS — J9601 Acute respiratory failure with hypoxia: Secondary | ICD-10-CM | POA: Diagnosis not present

## 2024-04-04 LAB — GLUCOSE, CAPILLARY
Glucose-Capillary: 114 mg/dL — ABNORMAL HIGH (ref 70–99)
Glucose-Capillary: 118 mg/dL — ABNORMAL HIGH (ref 70–99)
Glucose-Capillary: 121 mg/dL — ABNORMAL HIGH (ref 70–99)
Glucose-Capillary: 133 mg/dL — ABNORMAL HIGH (ref 70–99)
Glucose-Capillary: 137 mg/dL — ABNORMAL HIGH (ref 70–99)
Glucose-Capillary: 156 mg/dL — ABNORMAL HIGH (ref 70–99)

## 2024-04-04 MED ORDER — DONEPEZIL HCL 10 MG PO TBDP: 10.0000 mg | ORAL_TABLET | Freq: Every day | ORAL | 1 refills | Status: AC

## 2024-04-04 MED ORDER — DOXAZOSIN MESYLATE 2 MG PO TABS
1.0000 mg | ORAL_TABLET | Freq: Every day | ORAL | 0 refills | Status: AC
  Filled 2024-04-04: qty 15, 30d supply, fill #0

## 2024-04-04 MED ORDER — METFORMIN HCL 500 MG/5ML PO SOLN: 1000.0000 mg | Freq: Two times a day (BID) | ORAL | 0 refills | Status: AC

## 2024-04-05 NOTE — Progress Notes (Signed)
 04/04/2024 2310    Pt discharge to home via [facility].  Notified son Christian Morgan that pt was on his way and that a copy of patients D/C instructions were in his personal belongings bag.   Christian Morgan

## 2024-05-04 ENCOUNTER — Telehealth: Admitting: Neurology

## 2024-06-08 ENCOUNTER — Ambulatory Visit: Admitting: Podiatry

## 2024-07-06 ENCOUNTER — Ambulatory Visit: Payer: Self-pay | Admitting: Neurology
